# Patient Record
Sex: Female | Born: 1985 | Race: White | Hispanic: No | Marital: Married | Smoking: Former smoker
Health system: Southern US, Community
[De-identification: ages and names within clinical notes are randomized; demographics above are authoritative.]

## PROBLEM LIST (undated history)

## (undated) DIAGNOSIS — R1110 Vomiting, unspecified: Secondary | ICD-10-CM

## (undated) DIAGNOSIS — N920 Excessive and frequent menstruation with regular cycle: Secondary | ICD-10-CM

## (undated) DIAGNOSIS — F99 Mental disorder, not otherwise specified: Secondary | ICD-10-CM

## (undated) DIAGNOSIS — D649 Anemia, unspecified: Secondary | ICD-10-CM

## (undated) DIAGNOSIS — U071 COVID-19: Secondary | ICD-10-CM

## (undated) DIAGNOSIS — B9689 Other specified bacterial agents as the cause of diseases classified elsewhere: Secondary | ICD-10-CM

## (undated) DIAGNOSIS — N923 Ovulation bleeding: Secondary | ICD-10-CM

## (undated) DIAGNOSIS — T4145XA Adverse effect of unspecified anesthetic, initial encounter: Secondary | ICD-10-CM

## (undated) DIAGNOSIS — K219 Gastro-esophageal reflux disease without esophagitis: Secondary | ICD-10-CM

## (undated) DIAGNOSIS — N3010 Interstitial cystitis (chronic) without hematuria: Secondary | ICD-10-CM

## (undated) DIAGNOSIS — R102 Pelvic and perineal pain: Secondary | ICD-10-CM

## (undated) DIAGNOSIS — R51 Headache: Secondary | ICD-10-CM

## (undated) DIAGNOSIS — N189 Chronic kidney disease, unspecified: Secondary | ICD-10-CM

## (undated) DIAGNOSIS — R011 Cardiac murmur, unspecified: Secondary | ICD-10-CM

## (undated) DIAGNOSIS — R519 Headache, unspecified: Secondary | ICD-10-CM

## (undated) DIAGNOSIS — E039 Hypothyroidism, unspecified: Secondary | ICD-10-CM

## (undated) DIAGNOSIS — R350 Frequency of micturition: Secondary | ICD-10-CM

## (undated) DIAGNOSIS — R351 Nocturia: Secondary | ICD-10-CM

## (undated) DIAGNOSIS — R87612 Low grade squamous intraepithelial lesion on cytologic smear of cervix (LGSIL): Secondary | ICD-10-CM

## (undated) DIAGNOSIS — K601 Chronic anal fissure: Secondary | ICD-10-CM

## (undated) DIAGNOSIS — T8859XA Other complications of anesthesia, initial encounter: Secondary | ICD-10-CM

## (undated) DIAGNOSIS — Z8759 Personal history of other complications of pregnancy, childbirth and the puerperium: Secondary | ICD-10-CM

## (undated) DIAGNOSIS — N760 Acute vaginitis: Secondary | ICD-10-CM

## (undated) DIAGNOSIS — R112 Nausea with vomiting, unspecified: Secondary | ICD-10-CM

## (undated) DIAGNOSIS — J45909 Unspecified asthma, uncomplicated: Secondary | ICD-10-CM

## (undated) DIAGNOSIS — F419 Anxiety disorder, unspecified: Secondary | ICD-10-CM

## (undated) DIAGNOSIS — R3915 Urgency of urination: Secondary | ICD-10-CM

## (undated) DIAGNOSIS — Z9889 Other specified postprocedural states: Secondary | ICD-10-CM

## (undated) HISTORY — PX: WISDOM TOOTH EXTRACTION: SHX21

## (undated) HISTORY — DX: Chronic anal fissure: K60.1

## (undated) HISTORY — PX: COLPOSCOPY: SHX161

## (undated) HISTORY — DX: COVID-19: U07.1

## (undated) HISTORY — PX: LAPAROSCOPIC LYSIS OF ADHESIONS: SHX5905

---
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
## 2007-04-18 ENCOUNTER — Encounter: Admission: RE | Admit: 2007-04-18 | Discharge: 2007-04-18 | Payer: Self-pay | Admitting: Internal Medicine

## 2007-05-04 ENCOUNTER — Emergency Department (HOSPITAL_COMMUNITY): Admission: EM | Admit: 2007-05-04 | Discharge: 2007-05-04 | Payer: Self-pay | Admitting: Emergency Medicine

## 2008-09-13 DIAGNOSIS — R87612 Low grade squamous intraepithelial lesion on cytologic smear of cervix (LGSIL): Secondary | ICD-10-CM

## 2008-09-13 HISTORY — PX: COLPOSCOPY: SHX161

## 2008-09-13 HISTORY — DX: Low grade squamous intraepithelial lesion on cytologic smear of cervix (LGSIL): R87.612

## 2011-08-30 ENCOUNTER — Encounter (HOSPITAL_COMMUNITY): Payer: Self-pay

## 2011-09-02 ENCOUNTER — Other Ambulatory Visit: Payer: Self-pay | Admitting: Obstetrics and Gynecology

## 2011-09-13 ENCOUNTER — Encounter (HOSPITAL_COMMUNITY): Payer: Self-pay

## 2011-09-13 ENCOUNTER — Encounter (HOSPITAL_COMMUNITY)
Admission: RE | Admit: 2011-09-13 | Discharge: 2011-09-13 | Disposition: A | Discharge status: Home / Self Care | Payer: 59 | Source: Ambulatory Visit | Attending: Obstetrics and Gynecology | Admitting: Obstetrics and Gynecology

## 2011-09-13 HISTORY — DX: Anxiety disorder, unspecified: F41.9

## 2011-09-13 LAB — BASIC METABOLIC PANEL
BUN: 14 mg/dL (ref 6–23)
CO2: 23 mEq/L (ref 19–32)
Calcium: 9.3 mg/dL (ref 8.4–10.5)
Chloride: 103 mEq/L (ref 96–112)
Creatinine, Ser: 0.77 mg/dL (ref 0.50–1.10)
GFR calc Af Amer: >90 mL/min (ref >90)
GFR calc non Af Amer: >90 mL/min (ref >90)
Glucose, Bld: 103 mg/dL — ABNORMAL HIGH (ref 70–99)
Potassium: 4.3 mEq/L (ref 3.5–5.1)
Sodium: 135 mEq/L (ref 135–145)

## 2011-09-13 LAB — CBC
HCT: 39.9 % (ref 36.0–46.0)
Hemoglobin: 13.8 g/dL (ref 12.0–15.0)
MCH: 31.3 pg (ref 26.0–34.0)
MCHC: 34.6 g/dL (ref 30.0–36.0)
MCV: 90.5 fL (ref 78.0–100.0)
Platelets: 286 10*3/uL (ref 150–400)
RBC: 4.41 MIL/uL (ref 3.87–5.11)
RDW: 12.8 % (ref 11.5–15.5)
WBC: 9.5 10*3/uL (ref 4.0–10.5)

## 2011-09-13 LAB — SURGICAL PCR SCREEN
MRSA, PCR: NEGATIVE
Staphylococcus aureus: POSITIVE — AB

## 2011-09-13 NOTE — Patient Instructions (Addendum)
22 [name]  09/13/2011   Your procedure is scheduled on:  09/16/11 7:30  Enter through the Main Entrance of [facility] at 600 AM.  Pick up the phone at the desk and dial [phone].   Call this number if you have problems the morning of surgery: [phone]   Remember:   Do not eat food:After Midnight.  Do not drink clear liquids: After Midnight.  Take these medicines the morning of surgery with A SIP OF WATER: NA   Do not wear jewelry, make-up or nail polish.  Do not wear lotions, powders, or perfumes. You may wear deodorant.  Do not shave 48 hours prior to surgery.  Do not bring valuables to the hospital.  Contacts, dentures or bridgework may not be worn into surgery.  Leave suitcase in the car. After surgery it may be brought to your room.  For patients admitted to the hospital, checkout time is 11:00 AM the day of discharge.   Patients discharged the day of surgery will not be allowed to drive home.  Name and phone number of your driver: Mother   See Hx  Special Instructions: CHG Shower Use Special Wash: 1/2 bottle night before surgery and 1/2 bottle morning of surgery.   Please read over the following fact sheets that you were given: MRSA Information

## 2011-09-15 NOTE — H&P (Signed)
Subjective: Patient is a 26 y.o. gravida 0 para 0, female.  I was consulted for evaluation of chronic pelvic pain. Onset of symptoms was gradual starting several month ago with gradually worsening course since that time. The pain occurs without warning and may not be related to any menstrual bleeding. . It is located in the pelvic and lasts 2 days. She describes the pain as stabbing. Symptoms at times require percocet to resolve the pain.. In the past, she has undergone medical management, including OCP's continuous.  She has no history of PID, STD's. She does desire further childbearing. She did have a mirena IUD with was removed because os bleeding and pelvic pain issue and she now has an Implanon device. Even though she is not have menses she is still have episodes of severe pain and now presents for laparoscopy to identify the etiology.  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  Past Medical History  Diagnosis Date  . Anxiety     Past Surgical History  Procedure Date  . Wisdom tooth extraction 2008    No prescriptions prior to admission   Allergies  Allergen Reactions  . Penicillins Hives  . Tramadol Itching  . Vicodin (Hydrocodone-Acetaminophen) Itching    History  Substance Use Topics  . Smoking status: Current Everyday Smoker  . Smokeless tobacco: Not on file  . Alcohol Use: Yes     Rarely    No family history on file.  Review of Systems GI: no nausea, vomiting or diarrhea GU: No dysuria, frequency or urgency Gyn: Denies discharge or itch. Does have episodes of severe  pain will are now being assessed   Objective: Vital signs in last 24 hours:    There were no vitals taken for this visit.  General Appearance:    Alert, cooperative, no  distress, appears stated age  Head:    Normocephalic, without obvious abnormality, atraumatic  Eyes:    PERRL, conjunctiva/corneas clear, EOM's intact, fundi    benign, both eyes  Ears:    Normal TM's and external ear canals, both ears  Nose:   Nares normal, septum midline, mucosa normal, no drainage    or sinus tenderness  Throat:   Lips, mucosa, and tongue normal; teeth and gums normal  Neck:   Supple, symmetrical, trachea midline, no adenopathy;    thyroid:  no enlargement/tenderness/nodules; no carotid   bruit or JVD  Back:     Symmetric, no curvature, ROM normal, no CVA tenderness  Lungs:     Clear to auscultation bilaterally, respirations unlabored  Chest Wall:    No tenderness or deformity   Heart:    Regular rate and rhythm, S1 and S2 normal, no murmur, rub   or gallop     Abdomen:     Soft, non-tender, bowel sounds active all four quadrants,    no masses, no organomegaly  Genitalia:    Normal female without lesion, discharge or tenderness   BUS: are within normal limits    Vagina is without lesion or significant discharge   Cervix: Is without lesion and is tender to touch and motion    Uterus is normal size and is tender to touch and motion    Adnexa reveal no masses  Assessment/Plan: Chronic Pelvic Pain  Plan is to perform laparoscopy and endometrial biopsy to see if an etiology for the pain can be established and corrected.  The risks and benefits of this procedure were discussed with the patient and informed consent has been obtained.

## 2011-09-16 ENCOUNTER — Encounter (HOSPITAL_COMMUNITY): Payer: Self-pay | Admitting: Anesthesiology

## 2011-09-16 ENCOUNTER — Ambulatory Visit (HOSPITAL_COMMUNITY): Payer: 59 | Admitting: Anesthesiology

## 2011-09-16 ENCOUNTER — Ambulatory Visit (HOSPITAL_COMMUNITY)
Admission: RE | Admit: 2011-09-16 | Discharge: 2011-09-16 | Disposition: A | Discharge status: Home / Self Care | Payer: 59 | Source: Ambulatory Visit | Attending: Obstetrics and Gynecology | Admitting: Obstetrics and Gynecology

## 2011-09-16 ENCOUNTER — Encounter (HOSPITAL_COMMUNITY): Payer: Self-pay | Admitting: *Deleted

## 2011-09-16 ENCOUNTER — Encounter (HOSPITAL_COMMUNITY)
Admission: RE | Disposition: A | Discharge status: Home / Self Care | Payer: Self-pay | Source: Ambulatory Visit | Attending: Obstetrics and Gynecology

## 2011-09-16 ENCOUNTER — Other Ambulatory Visit: Payer: Self-pay | Admitting: Obstetrics and Gynecology

## 2011-09-16 DIAGNOSIS — G8929 Other chronic pain: Secondary | ICD-10-CM

## 2011-09-16 DIAGNOSIS — Z01812 Encounter for preprocedural laboratory examination: Secondary | ICD-10-CM | POA: Insufficient documentation

## 2011-09-16 DIAGNOSIS — N949 Unspecified condition associated with female genital organs and menstrual cycle: Secondary | ICD-10-CM | POA: Insufficient documentation

## 2011-09-16 DIAGNOSIS — N938 Other specified abnormal uterine and vaginal bleeding: Secondary | ICD-10-CM | POA: Insufficient documentation

## 2011-09-16 DIAGNOSIS — N35919 Unspecified urethral stricture, male, unspecified site: Secondary | ICD-10-CM | POA: Insufficient documentation

## 2011-09-16 DIAGNOSIS — Z01818 Encounter for other preprocedural examination: Secondary | ICD-10-CM | POA: Insufficient documentation

## 2011-09-16 HISTORY — PX: DILATION AND CURETTAGE OF UTERUS: SHX78

## 2011-09-16 HISTORY — PX: LAPAROSCOPY: SHX197

## 2011-09-16 LAB — PREGNANCY, URINE: Preg Test, Ur: NEGATIVE

## 2011-09-16 SURGERY — LAPAROSCOPY OPERATIVE
Anesthesia: General | Site: Vagina | Wound class: Clean Contaminated

## 2011-09-16 MED ORDER — KETOROLAC TROMETHAMINE 30 MG/ML IJ SOLN
INTRAMUSCULAR | Status: DC | PRN
  Administered 2011-09-16: 30 mg via INTRAVENOUS

## 2011-09-16 MED ORDER — ROCURONIUM BROMIDE 100 MG/10ML IV SOLN
INTRAVENOUS | Status: DC | PRN
  Administered 2011-09-16: 30 mg via INTRAVENOUS

## 2011-09-16 MED ORDER — ONDANSETRON HCL 4 MG/2ML IJ SOLN
INTRAMUSCULAR | Status: DC | PRN
  Administered 2011-09-16: 4 mg via INTRAVENOUS

## 2011-09-16 MED ORDER — LIDOCAINE HCL (CARDIAC) 20 MG/ML IV SOLN
INTRAVENOUS | Status: AC
  Filled 2011-09-16: qty 5

## 2011-09-16 MED ORDER — LIDOCAINE 5 % EX OINT
TOPICAL_OINTMENT | CUTANEOUS | Status: DC | PRN
  Administered 2011-09-16: 1 via TOPICAL
  Filled 2011-09-16: qty 35.44

## 2011-09-16 MED ORDER — DEXAMETHASONE SODIUM PHOSPHATE 4 MG/ML IJ SOLN
INTRAMUSCULAR | Status: DC | PRN
  Administered 2011-09-16: 6 mg via INTRAVENOUS

## 2011-09-16 MED ORDER — PROPOFOL 10 MG/ML IV EMUL
INTRAVENOUS | Status: DC | PRN
  Administered 2011-09-16: 160 mg via INTRAVENOUS

## 2011-09-16 MED ORDER — FENTANYL CITRATE 0.05 MG/ML IJ SOLN
INTRAMUSCULAR | Status: AC
  Filled 2011-09-16: qty 5

## 2011-09-16 MED ORDER — NEOSTIGMINE METHYLSULFATE 1 MG/ML IJ SOLN
INTRAMUSCULAR | Status: DC | PRN
  Administered 2011-09-16: 2 mg via INTRAVENOUS

## 2011-09-16 MED ORDER — HYDROMORPHONE HCL PF 1 MG/ML IJ SOLN
0.2500 mg | INTRAMUSCULAR | Status: DC | PRN
  Administered 2011-09-16 (×2): 0.5 mg via INTRAVENOUS

## 2011-09-16 MED ORDER — GLYCOPYRROLATE 0.2 MG/ML IJ SOLN
INTRAMUSCULAR | Status: DC | PRN
  Administered 2011-09-16: .8 mg via INTRAVENOUS

## 2011-09-16 MED ORDER — MIDAZOLAM HCL 5 MG/5ML IJ SOLN
INTRAMUSCULAR | Status: DC | PRN
  Administered 2011-09-16: 2 mg via INTRAVENOUS

## 2011-09-16 MED ORDER — PHENAZOPYRIDINE HCL 100 MG PO TABS
100.0000 mg | ORAL_TABLET | Freq: Once | ORAL | Status: AC
  Administered 2011-09-16: 100 mg via ORAL
  Filled 2011-09-16: qty 1

## 2011-09-16 MED ORDER — ONDANSETRON HCL 4 MG/2ML IJ SOLN
INTRAMUSCULAR | Status: AC
  Filled 2011-09-16: qty 2

## 2011-09-16 MED ORDER — NEOSTIGMINE METHYLSULFATE 1 MG/ML IJ SOLN
INTRAMUSCULAR | Status: AC
  Filled 2011-09-16: qty 10

## 2011-09-16 MED ORDER — KETOROLAC TROMETHAMINE 30 MG/ML IJ SOLN
INTRAMUSCULAR | Status: AC
  Filled 2011-09-16: qty 1

## 2011-09-16 MED ORDER — PROPOFOL 10 MG/ML IV EMUL
INTRAVENOUS | Status: AC
  Filled 2011-09-16: qty 20

## 2011-09-16 MED ORDER — LACTATED RINGERS IV SOLN
INTRAVENOUS | Status: DC
  Administered 2011-09-16: 07:00:00 via INTRAVENOUS
  Administered 2011-09-16: 125 mL/h via INTRAVENOUS
  Administered 2011-09-16 (×2): via INTRAVENOUS

## 2011-09-16 MED ORDER — LIDOCAINE HCL (CARDIAC) 20 MG/ML IV SOLN
INTRAVENOUS | Status: DC | PRN
  Administered 2011-09-16: 60 mg via INTRAVENOUS

## 2011-09-16 MED ORDER — MIDAZOLAM HCL 2 MG/2ML IJ SOLN
INTRAMUSCULAR | Status: AC
  Filled 2011-09-16: qty 2

## 2011-09-16 MED ORDER — DEXAMETHASONE SODIUM PHOSPHATE 10 MG/ML IJ SOLN
INTRAMUSCULAR | Status: AC
  Filled 2011-09-16: qty 1

## 2011-09-16 MED ORDER — BUPIVACAINE HCL (PF) 0.25 % IJ SOLN
INTRAMUSCULAR | Status: DC | PRN
  Administered 2011-09-16: 10 mL

## 2011-09-16 MED ORDER — HYDROMORPHONE HCL PF 1 MG/ML IJ SOLN
INTRAMUSCULAR | Status: AC
  Administered 2011-09-16: 0.5 mg via INTRAVENOUS
  Filled 2011-09-16: qty 1

## 2011-09-16 MED ORDER — GLYCOPYRROLATE 0.2 MG/ML IJ SOLN
INTRAMUSCULAR | Status: AC
  Filled 2011-09-16: qty 1

## 2011-09-16 MED ORDER — FENTANYL CITRATE 0.05 MG/ML IJ SOLN
INTRAMUSCULAR | Status: DC | PRN
  Administered 2011-09-16: 50 ug via INTRAVENOUS
  Administered 2011-09-16: 100 ug via INTRAVENOUS

## 2011-09-16 MED ORDER — ROCURONIUM BROMIDE 50 MG/5ML IV SOLN
INTRAVENOUS | Status: AC
  Filled 2011-09-16: qty 1

## 2011-09-16 SURGICAL SUPPLY — 31 items
ABLATOR ENDOMETRIAL BIPOLAR (ABLATOR) IMPLANT
BAG SPEC RTRVL LRG 6X4 10 (ENDOMECHANICALS)
BLADE SURG 15 STRL LF C SS BP (BLADE) ×3 IMPLANT
BLADE SURG 15 STRL SS (BLADE) ×4
CABLE HIGH FREQUENCY MONO STRZ (ELECTRODE) IMPLANT
CANISTER SUCTION 2500CC (MISCELLANEOUS) ×4 IMPLANT
CATH ROBINSON RED A/P 16FR (CATHETERS) ×4 IMPLANT
CLOTH BEACON ORANGE TIMEOUT ST (SAFETY) ×4 IMPLANT
CONTAINER PREFILL 10% NBF 60ML (FORM) ×8 IMPLANT
DRESSING COVERLET 3X1 FLEXIBLE (GAUZE/BANDAGES/DRESSINGS) ×2 IMPLANT
ELECT REM PT RETURN 9FT ADLT (ELECTROSURGICAL)
ELECTRODE REM PT RTRN 9FT ADLT (ELECTROSURGICAL) IMPLANT
EVACUATOR SMOKE 8.L (FILTER) ×4 IMPLANT
FORCEPS CUTTING 33CM 5MM (CUTTING FORCEPS) IMPLANT
GLOVE BIO SURGEON STRL SZ7.5 (GLOVE) ×8 IMPLANT
GOWN PREVENTION PLUS LG XLONG (DISPOSABLE) ×8 IMPLANT
GOWN PREVENTION PLUS XXLARGE (GOWN DISPOSABLE) ×4 IMPLANT
GOWN STRL REIN XL XLG (GOWN DISPOSABLE) ×4 IMPLANT
LOOP ANGLED CUTTING 22FR (CUTTING LOOP) IMPLANT
NS IRRIG 1000ML POUR BTL (IV SOLUTION) ×4 IMPLANT
PACK HYSTEROSCOPY LF (CUSTOM PROCEDURE TRAY) ×4 IMPLANT
PACK LAPAROSCOPY BASIN (CUSTOM PROCEDURE TRAY) ×4 IMPLANT
POUCH SPECIMEN RETRIEVAL 10MM (ENDOMECHANICALS) IMPLANT
RINGERS IRRIG 1000ML POUR BTL (IV SOLUTION) IMPLANT
SET IRRIG TUBING LAPAROSCOPIC (IRRIGATION / IRRIGATOR) IMPLANT
SOLUTION ELECTROLUBE (MISCELLANEOUS) IMPLANT
STRIP CLOSURE SKIN 1/4X3 (GAUZE/BANDAGES/DRESSINGS) ×2 IMPLANT
SUT VICRYL 4-0 PS2 18IN ABS (SUTURE) ×4 IMPLANT
TOWEL OR 17X24 6PK STRL BLUE (TOWEL DISPOSABLE) ×8 IMPLANT
WARMER LAPAROSCOPE (MISCELLANEOUS) ×4 IMPLANT
WATER STERILE IRR 1000ML POUR (IV SOLUTION) ×4 IMPLANT

## 2011-09-16 NOTE — Transfer of Care (Signed)
Immediate Anesthesia Transfer of Care Note  Patient: [name]  Procedure(s) Performed:  LAPAROSCOPY OPERATIVE; DILATATION AND CURETTAGE - Endometrial Biopsy, Urethral Dilation  Patient Location: PACU  Anesthesia Type: General  Level of Consciousness: awake, alert  and oriented  Airway & Oxygen Therapy: Patient Spontanous Breathing and Patient connected to nasal cannula oxygen  Post-op Assessment: Report given to PACU RN and Post -op Vital signs reviewed and stable  Post vital signs: Reviewed and stable  Complications: No apparent anesthesia complications

## 2011-09-16 NOTE — Anesthesia Postprocedure Evaluation (Signed)
Anesthesia Post Note  Patient: [name]  Procedure(s) Performed:  LAPAROSCOPY OPERATIVE; DILATATION AND CURETTAGE - Endometrial Biopsy, Urethral Dilation  Anesthesia type: GA  Patient location: PACU  Post pain: Pain level controlled  Post assessment: Post-op Vital signs reviewed  Last Vitals:  Filed Vitals:   09/16/11 0945  BP:   Pulse: 67  Temp:   Resp: 16    Post vital signs: Reviewed  Level of consciousness: sedated  Complications: No apparent anesthesia complications

## 2011-09-16 NOTE — Progress Notes (Signed)
Status and exam unchanged. Will proceed with laparoscopy and endometrial biopsy.

## 2011-09-16 NOTE — Op Note (Signed)
NAME:  [name], [name]                 ACCOUNT NO.:  [id number]  MEDICAL RECORD NO.:  [id number]  LOCATION:  [facility]                          FACILITY:  [facility]  PHYSICIAN:  Miguel Aschoff, M.D.       DATE OF BIRTH:  July 14, 1986  DATE OF PROCEDURE:  09/16/2011 DATE OF DISCHARGE:                              OPERATIVE REPORT   PREOPERATIVE DIAGNOSES:  Chronic pelvic pain, urethral stenosis, irregular vaginal bleeding.  POSTOPERATIVE DIAGNOSES:  Chronic pelvic pain, urethral stenosis, irregular vaginal bleeding.  PROCEDURE:  Endometrial biopsy, cervical dilatation, urethral dilatation followed by diagnostic laparoscopy.  SURGEON:  Miguel Aschoff, MD  ANESTHESIA:  General.  COMPLICATIONS:  None.  JUSTIFICATION:  The patient is a 26 year old white female with a several month history of severe lower abdominal and pelvic pain that has not responded to conservative medical therapy including antibiotic treatments and efforts to arrest her menses both with Mirena intrauterine device followed by Implanon implant.  Pelvic pain persists. It is not associated with cycles, severe in nature, requiring narcotic medication and because of this she is being taken to the operating room at this time to see if an etiology for this pelvic pain could be established.  In addition, the patient reports urinary hesitancy in starting her stream which is becoming increasingly more difficult.  She is suspected to have urethral stenosis and this will be corrected at this setting.  In addition, endometrial biopsy would be carried out in effort to rule out chronic endometritis.  The risks and benefits of this procedure were discussed with the patient and her mother.  Informed consent has been obtained.  PROCEDURE IN DETAIL:  The patient was taken to the operating room, placed in the supine position.  General anesthesia was administered without difficulty.  She was then prepped and draped in the usual sterile fashion.  Once  this was done, she was placed in dorsal lithotomy position.  The bladder was catheterized.  Then using 0 urethral dilators, the urethra was dilated to a normal caliber without difficulty.  After this was done, a speculum placed in the vaginal vault.  Anterior cervical lip was grasped with tenaculum and then using 0-Pratt dilators, the cervical canal was dilated until a #25 Pratt dilator could be passed.  Then, using a small curette, 3 swipes of endometrial biopsy were carried out and sent for histologic study to rule out chronic endometritis or any other source of her persistent pain.  Once this was completed, Hulka tenaculum was placed through the cervix and then attention was directed to the abdomen.  A small infraumbilical incision was made.  A Veress needle was inserted and then the abdomen was insufflated with 3 L of CO2.  Following the insufflation, the trocar to laparoscope was placed followed by laparoscope itself.  Then under direct visualization, a 5 mm port was established in the right lower quadrant without difficulty.  Systematic inspection revealed the anterior bladder peritoneum to be unremarkable. The uterus appeared to be normal size and shape.  The surface was smooth and regular.  There was no evidence of any inflammation involving the surface of the uterus.  The round ligaments were inspected on the  right side.  The round ligament was completely normal.  On the left side, there was a defect at the exit point of the round ligament consistent with small hernia.  The cul-de-sac was then inspected looking for implants consistent with endometriosis but no implants were found.  This cul-de-sac appeared grossly within normal limits.  The tubes were then inspected.  They were also noted to be within normal limits with fine delicate fimbria and no adhesions noted.  The ovaries were inspected and were also noted to be completely within normal limits secondary to the fact that the  patient had the Implanon implant.  There were no adhesions around the ovaries and tubes.  The anatomy was completely normal.  The appendix was then visualized, again was noninflamed, appeared to be completely normal, and then the upper abdomen was inspected.  The liver surface was unremarkable.  There were no abnormalities noted at all in the abdomen.  At this point, the Kleppinger forceps were placed through the accessory port.  The uterosacral ligaments were identified at the junction into the uterus and they were cauterized in effort to see if we could decrease the patient's pelvic pain secondary to uterine origin. After this was done, attention was carried to avoid any cauterization in the ureters.  The procedure was completed.  The CO2 was allowed to escape.  All instruments were removed.  The small incisions were closed using subcuticular 3-0 Vicryl.  The port sites were then injected with 0.25% Marcaine.  A total of 10 mL were used.  The patient was then reversed in the anesthetic and taken to the recovery room in satisfactory condition.  Estimated blood loss was minimal.  The plan is for the patient to be discharged home.  Medications for home include Vicodin 1 every 3-4 hours as needed for pain.  She is instructed to place nothing in the vagina for 2 weeks.  To call for any problems such as fever, pain, or heavy bleeding.  She will be reassessed in 2-3 weeks.  If her pelvic pain and abdominal pain persists, consultation would be made with Gastroenterology and Neurology to see if an etiology could be established.     Miguel Aschoff, M.D.     AR/MEDQ  D:  09/16/2011  T:  09/16/2011  Job:  [id number]

## 2011-09-16 NOTE — Anesthesia Preprocedure Evaluation (Addendum)
Anesthesia Evaluation  Patient identified by MRN, date of birth, ID band Patient awake    Reviewed: Allergy & Precautions, H&P , Patient's Chart, lab work & pertinent test results, reviewed documented beta blocker date and time   Airway Mallampati: II TM Distance: >3 FB Neck ROM: full    Dental No notable dental hx.    Pulmonary  clear to auscultation  Pulmonary exam normal       Cardiovascular regular Normal    Neuro/Psych    GI/Hepatic   Endo/Other    Renal/GU      Musculoskeletal   Abdominal   Peds  Hematology   Anesthesia Other Findings   Reproductive/Obstetrics                           Anesthesia Physical Anesthesia Plan  ASA: II  Anesthesia Plan: General   Post-op Pain Management:    Induction: Intravenous  Airway Management Planned: Oral ETT  Additional Equipment:   Intra-op Plan:   Post-operative Plan:   Informed Consent: I have reviewed the patients History and Physical, chart, labs and discussed the procedure including the risks, benefits and alternatives for the proposed anesthesia with the patient or authorized representative who has indicated his/her understanding and acceptance.   Dental Advisory Given and Dental advisory given  Plan Discussed with: CRNA and Surgeon  Anesthesia Plan Comments: (  Discussed  general anesthesia, including possible nausea, instrumentation of airway, sore throat,pulmonary aspiration, etc. I asked if the were any outstanding questions, or  concerns before we proceeded. )        Anesthesia Quick Evaluation  

## 2011-09-16 NOTE — Brief Op Note (Signed)
09/16/2011  8:06 AM  PATIENT:  [name]  26 y.o. female  PRE-OPERATIVE DIAGNOSIS:  pelvic pain  POST-OPERATIVE DIAGNOSIS:  pelvic pain; urethral stenosis  PROCEDURE:  Procedure(s): LAPAROSCOPY OPERATIVE DILATATION AND CURETTAGE  SURGEON:  Surgeon(s): Miguel Aschoff  ANESTHESIA:   general  EBL:  Total I/O In: 1030 [I.V.:1030] Out: -   BLOOD ADMINISTERED:none  DRAINS: none   LOCAL MEDICATIONS USED:  MARCAINE 10CC  SPECIMEN:  Source of Specimen:  endometrial currettings  DISPOSITION OF SPECIMEN:  PATHOLOGY  COUNTS:  YES  DICTATION: .Other Dictation: Dictation Number [id number]  PLAN OF CARE: Discharge to home after PACU  PATIENT DISPOSITION:  PACU - hemodynamically stable.   Delay start of Pharmacological VTE agent (>24hrs) due to surgical blood loss or risk of bleeding:  {YES/NO/NOT APPLICABLE:20182

## 2011-09-16 NOTE — Anesthesia Procedure Notes (Signed)
Procedure Name: Intubation Date/Time: 09/16/2011 7:29 AM Performed by: Carlyle Lipa Pre-anesthesia Checklist: Patient identified, Timeout performed, Emergency Drugs available, Suction available and Patient being monitored Patient Re-evaluated:Patient Re-evaluated prior to inductionOxygen Delivery Method: Circle System Utilized Preoxygenation: Pre-oxygenation with 100% oxygen Intubation Type: IV induction Ventilation: Mask ventilation without difficulty Laryngoscope Size: Miller and 2 Grade View: Grade I Tube type: Oral Tube size: 7.0 mm Number of attempts: 1 Airway Equipment and Method: stylet Placement Confirmation: ETT inserted through vocal cords under direct vision Secured at: 20 cm Tube secured with: Tape Dental Injury: Teeth and Oropharynx as per pre-operative assessment

## 2011-09-20 ENCOUNTER — Encounter (HOSPITAL_COMMUNITY): Payer: Self-pay | Admitting: Obstetrics and Gynecology

## 2011-09-20 ENCOUNTER — Other Ambulatory Visit: Payer: Self-pay | Admitting: Urology

## 2011-10-08 ENCOUNTER — Encounter (HOSPITAL_BASED_OUTPATIENT_CLINIC_OR_DEPARTMENT_OTHER): Payer: Self-pay | Admitting: *Deleted

## 2011-10-08 NOTE — Progress Notes (Signed)
NPO AFTER MN. ARRIVES AT 0615. NEEDS HH AND URINE PREG.

## 2011-10-11 NOTE — H&P (Signed)
1. Bladder Pain 788.99 2. Feelings Of Urinary Urgency 788.63 3. Nocturia 788.43 4. Urinary Frequency 788.41  History of Present Illness  [name] is a 26 yo WF sent in consultation by Dr. Billy Coast for pelvic pain and urinary frequency and urgency.   Her symptoms have been going on for the past year and she is going to have laparoscopy on Thursday.  She had a drug eluting IUD placed in 12/10.  She had sharp pain with the IUD and it was removed.  She had another placed in 6/11 and had similar problem with pain similar to a menstrual cramp.  She has not had incontinence but she has marked suprapubic pain with a full bladder.  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   She has frequency q1hr and nocturia 3-4x.  She has no dysuria or hematuria.  She has some intermittancy and post void dribbling.  She hasn't noticed any dietary aggravators.  She has had no treatment directed at her bladder.   She has frequent UTI's but the last was in June.  She has irregular bowels with alternating constipation and diarrhea.  She denies dysparunia.   Past Medical History Problems  1. History of  Anxiety (Symptom) 300.00 2. History of  Asthma 493.90 3. History of  Esophageal Reflux 530.81 4. History of  Excessive Bleeding During Period (Menorrhagia) 626.2 5. History of  Migraine Headache 346.90  Surgical History Problems  1. History of  No Surgical Problems  Current Meds 1. Advil TABS; Therapy: (Recorded:31Dec2012) to 2. Aleve TABS; Therapy: (Recorded:31Dec2012) to 3. Estradiol 0.5 MG Oral Tablet; Therapy: (Recorded:31Dec2012) to 4. Frova 2.5 MG Oral Tablet; Therapy: (Recorded:31Dec2012) to 5. Implanon 68 MG Subcutaneous Implant; Therapy: (Recorded:31Dec2012) to 6. Xanax 0.25 MG Oral Tablet; Therapy: (Recorded:31Dec2012) to  Allergies Medication  1. Penicillins 2. Vivelle-Dot PTTW 3. Azithromycin TABS 4.  Hydrocodone-Acetaminophen TABS  Family History Problems  1. Maternal history of  Nephrolithiasis  Social History Problems    Alcohol Use   Caffeine Use   Marital History - Single   Occupation:   History of  Tobacco Use 305.1  Review of Systems Genitourinary, constitutional, skin, eye, otolaryngeal, hematologic/lymphatic, cardiovascular, pulmonary, endocrine, musculoskeletal, gastrointestinal, neurological and psychiatric system(s) were reviewed and pertinent findings if present are noted.  Genitourinary: urinary frequency, urinary urgency, nocturia, incontinence, urinary stream starts and stops, pelvic pain and dyspareunia, but no hematuria.  Gastrointestinal: abdominal pain, heartburn and diarrhea.  Constitutional: night sweats and feeling tired (fatigue).  Integumentary: pruritus.  Hematologic/Lymphatic: a tendency to easily bruise.  Endocrine: polydipsia.  Neurological: headache.  Psychiatric: anxiety.    Vitals Vital Signs [Data Includes: Last 1 Day]  31Dec2012 02:54PM  BMI Calculated: 22.36 BSA Calculated: 1.64 Height: 5 ft 4 in Weight: 131 lb  Blood Pressure: 133 / 76 Temperature: 98.4 F Heart Rate: 92  Physical Exam Constitutional: Well nourished and well developed . No acute distress.  ENT:. The ears and nose are normal in appearance.  Neck: The appearance of the neck is normal and no neck mass is present.  Pulmonary: No respiratory distress and normal respiratory rhythm and effort.  Cardiovascular: Heart rate and rhythm are normal . No peripheral edema.  Abdomen: The abdomen is soft and nontender. No masses are palpated. No CVA tenderness. No hernias are palpable. No hepatosplenomegaly noted. Genitourinary: Examination of the external genitalia shows normal female external genitalia and no lesions. The  urethra is normal in appearance and not tender. There is no urethral mass. Urethral hypermobility is not present. Vaginal exam demonstrates no abnormalities and  no uterine prolapse. No cystocele is identified. No rectocele is identified. The cervix is is without abnormalities.  There is cervical motion tenderness (mild). The uterus is without abnormalities. The adnexa are palpably normal. The bladder is tender, but normal on palpation and not distended. The anus is normal on inspection. The perineum is normal on inspection.  Lymphatics: The femoral and inguinal nodes are not enlarged or tender.  Skin: Normal skin turgor, no visible rash and no visible skin lesions.  Neuro/Psych:. Mood and affect are appropriate.    Results/Data Urine [Data Includes: Last 1 Day]  31Dec2012  COLOR: YELLOW  Reference Range YELLOW APPEARANCE: CLEAR  Reference Range CLEAR SPECIFIC GRAVITY: 1.025  Reference Range 1.005-1.030 pH: 6.0  Reference Range 5.0-8.0 GLUCOSE: NEG mg/dL Reference Range NEG BILIRUBIN: NEG  Reference Range NEG KETONE: NEG mg/dL Reference Range NEG BLOOD: NEG  Reference Range NEG PROTEIN: NEG mg/dL Reference Range NEG UROBILINOGEN: 1 mg/dL Reference Range 0.8-6.5 NITRITE: NEG  Reference Range NEG LEUKOCYTE ESTERASE: NEG  Reference Range NEG  Old records or history reviewed: Records from Dr. Billy Coast reviewed.    Assessment Assessed  1. Bladder Pain 788.99 2. Feelings Of Urinary Urgency 788.63 3. Nocturia 788.43 4. Urinary Frequency 788.41   She has a painful bladder with urgency and frequency.   Her bladder was tender on exam and she may have interstitial cystitis.   Plan Bladder Pain (788.99)  1. Uribel 118 MG Oral Capsule; take 1 po qid prn burning; Therapy: 31Dec2012 to (Last  Rx:31Dec2012) 2. Pelvic Exam  Requested for: 31Dec2012 3. Follow-up Schedule Surgery Office  Follow-up  Requested for: 31Dec2012   I am going to give her Uribel samples and a script for symptom relief. I have given her an IC diet sheet. I am going to set her up for a cystoscopy with HOD and instillation of pyridium and marcaine after she recovers from her  laparoscopy.  I reviewed the risks including but not limited to bleeding, infection, bladder injury, retention, thrombotic events and anesthetic complications.

## 2011-10-13 NOTE — Anesthesia Preprocedure Evaluation (Addendum)
Anesthesia Evaluation  Patient identified by MRN, date of birth, ID band Patient awake    Reviewed: Allergy & Precautions, H&P , NPO status , Patient's Chart, lab work & pertinent test results  Airway Mallampati: II TM Distance: >3 FB Neck ROM: full    Dental No notable dental hx. (+) Teeth Intact and Dental Advisory Given   Pulmonary neg pulmonary ROS,  clear to auscultation  Pulmonary exam normal       Cardiovascular Exercise Tolerance: Good neg cardio ROS regular Normal    Neuro/Psych Negative Neurological ROS  Negative Psych ROS   GI/Hepatic negative GI ROS, Neg liver ROS,   Endo/Other  Negative Endocrine ROS  Renal/GU negative Renal ROS  Genitourinary negative   Musculoskeletal   Abdominal   Peds  Hematology negative hematology ROS (+)   Anesthesia Other Findings   Reproductive/Obstetrics negative OB ROS                          Anesthesia Physical Anesthesia Plan  ASA: I  Anesthesia Plan: General   Post-op Pain Management:    Induction: Intravenous  Airway Management Planned: LMA  Additional Equipment:   Intra-op Plan:   Post-operative Plan:   Informed Consent: I have reviewed the patients History and Physical, chart, labs and discussed the procedure including the risks, benefits and alternatives for the proposed anesthesia with the patient or authorized representative who has indicated his/her understanding and acceptance.   Dental Advisory Given  Plan Discussed with: CRNA and Surgeon  Anesthesia Plan Comments:         Anesthesia Quick Evaluation  

## 2011-10-14 ENCOUNTER — Ambulatory Visit (HOSPITAL_BASED_OUTPATIENT_CLINIC_OR_DEPARTMENT_OTHER): Payer: 59 | Admitting: Anesthesiology

## 2011-10-14 ENCOUNTER — Encounter (HOSPITAL_BASED_OUTPATIENT_CLINIC_OR_DEPARTMENT_OTHER): Payer: Self-pay | Admitting: *Deleted

## 2011-10-14 ENCOUNTER — Encounter (HOSPITAL_BASED_OUTPATIENT_CLINIC_OR_DEPARTMENT_OTHER)
Admission: RE | Disposition: A | Discharge status: Home / Self Care | Payer: Self-pay | Source: Ambulatory Visit | Attending: Urology

## 2011-10-14 ENCOUNTER — Encounter (HOSPITAL_BASED_OUTPATIENT_CLINIC_OR_DEPARTMENT_OTHER): Payer: Self-pay | Admitting: Anesthesiology

## 2011-10-14 ENCOUNTER — Ambulatory Visit (HOSPITAL_BASED_OUTPATIENT_CLINIC_OR_DEPARTMENT_OTHER)
Admission: RE | Admit: 2011-10-14 | Discharge: 2011-10-14 | Disposition: A | Discharge status: Home / Self Care | Payer: 59 | Source: Ambulatory Visit | Attending: Urology | Admitting: Urology

## 2011-10-14 DIAGNOSIS — R350 Frequency of micturition: Secondary | ICD-10-CM | POA: Insufficient documentation

## 2011-10-14 DIAGNOSIS — K219 Gastro-esophageal reflux disease without esophagitis: Secondary | ICD-10-CM | POA: Insufficient documentation

## 2011-10-14 DIAGNOSIS — Z79899 Other long term (current) drug therapy: Secondary | ICD-10-CM | POA: Insufficient documentation

## 2011-10-14 DIAGNOSIS — R3989 Other symptoms and signs involving the genitourinary system: Secondary | ICD-10-CM | POA: Insufficient documentation

## 2011-10-14 DIAGNOSIS — R3915 Urgency of urination: Secondary | ICD-10-CM | POA: Insufficient documentation

## 2011-10-14 DIAGNOSIS — N3010 Interstitial cystitis (chronic) without hematuria: Secondary | ICD-10-CM | POA: Insufficient documentation

## 2011-10-14 DIAGNOSIS — R351 Nocturia: Secondary | ICD-10-CM | POA: Insufficient documentation

## 2011-10-14 DIAGNOSIS — J45909 Unspecified asthma, uncomplicated: Secondary | ICD-10-CM | POA: Insufficient documentation

## 2011-10-14 HISTORY — DX: Frequency of micturition: R35.0

## 2011-10-14 HISTORY — DX: Ovulation bleeding: N92.3

## 2011-10-14 HISTORY — DX: Pelvic and perineal pain: R10.2

## 2011-10-14 HISTORY — DX: Excessive and frequent menstruation with regular cycle: N92.0

## 2011-10-14 HISTORY — DX: Nocturia: R35.1

## 2011-10-14 HISTORY — PX: CYSTO WITH HYDRODISTENSION: SHX5453

## 2011-10-14 HISTORY — DX: Urgency of urination: R39.15

## 2011-10-14 LAB — POCT HEMOGLOBIN-HEMACUE: Hemoglobin: 12.2 g/dL (ref 12.0–15.0)

## 2011-10-14 SURGERY — CYSTOSCOPY, WITH BLADDER HYDRODISTENSION
Anesthesia: General | Site: Bladder | Wound class: Clean Contaminated

## 2011-10-14 MED ORDER — FENTANYL CITRATE 0.05 MG/ML IJ SOLN
INTRAMUSCULAR | Status: DC | PRN
  Administered 2011-10-14 (×2): 50 ug via INTRAVENOUS

## 2011-10-14 MED ORDER — STERILE WATER FOR IRRIGATION IR SOLN
Status: DC | PRN
  Administered 2011-10-14: 3000 mL via INTRAVESICAL

## 2011-10-14 MED ORDER — PROMETHAZINE HCL 25 MG/ML IJ SOLN: 6.2500 mg | INTRAMUSCULAR | Status: DC | PRN

## 2011-10-14 MED ORDER — FENTANYL CITRATE 0.05 MG/ML IJ SOLN
25.0000 ug | INTRAMUSCULAR | Status: DC | PRN
  Administered 2011-10-14: 50 ug via INTRAVENOUS
  Administered 2011-10-14 (×2): 25 ug via INTRAVENOUS

## 2011-10-14 MED ORDER — PROPOFOL 10 MG/ML IV EMUL
INTRAVENOUS | Status: DC | PRN
  Administered 2011-10-14: 200 mg via INTRAVENOUS

## 2011-10-14 MED ORDER — KETOROLAC TROMETHAMINE 30 MG/ML IJ SOLN
INTRAMUSCULAR | Status: DC | PRN
  Administered 2011-10-14: 30 mg via INTRAVENOUS

## 2011-10-14 MED ORDER — CIPROFLOXACIN IN D5W 400 MG/200ML IV SOLN
400.0000 mg | INTRAVENOUS | Status: AC
  Administered 2011-10-14: 400 mg via INTRAVENOUS

## 2011-10-14 MED ORDER — PHENAZOPYRIDINE HCL 200 MG PO TABS
200.0000 mg | ORAL_TABLET | Freq: Three times a day (TID) | ORAL | Status: AC
  Administered 2011-10-14: 200 mg via ORAL

## 2011-10-14 MED ORDER — LACTATED RINGERS IV SOLN
INTRAVENOUS | Status: DC | PRN
  Administered 2011-10-14: 07:00:00 via INTRAVENOUS

## 2011-10-14 MED ORDER — LACTATED RINGERS IV SOLN: INTRAVENOUS | Status: DC

## 2011-10-14 MED ORDER — OXYCODONE-ACETAMINOPHEN 5-325 MG PO TABS
1.0000 | ORAL_TABLET | ORAL | Status: AC | PRN
  Administered 2011-10-14: 1 via ORAL

## 2011-10-14 MED ORDER — KETOROLAC TROMETHAMINE 30 MG/ML IJ SOLN
30.0000 mg | Freq: Four times a day (QID) | INTRAMUSCULAR | Status: AC | PRN
  Administered 2011-10-14: 30 mg via INTRAVENOUS

## 2011-10-14 MED ORDER — DEXAMETHASONE SODIUM PHOSPHATE 4 MG/ML IJ SOLN
INTRAMUSCULAR | Status: DC | PRN
  Administered 2011-10-14: 4 mg via INTRAVENOUS

## 2011-10-14 MED ORDER — CIPROFLOXACIN HCL 250 MG PO TABS: 500.0000 mg | ORAL_TABLET | Freq: Two times a day (BID) | ORAL | Status: AC

## 2011-10-14 MED ORDER — OXYCODONE-ACETAMINOPHEN 5-325 MG PO TABS: 1.0000 | ORAL_TABLET | Freq: Four times a day (QID) | ORAL | Status: DC | PRN

## 2011-10-14 MED ORDER — PHENAZOPYRIDINE HCL 200 MG PO TABS: 200.0000 mg | ORAL_TABLET | Freq: Three times a day (TID) | ORAL | Status: AC | PRN

## 2011-10-14 MED ORDER — ONDANSETRON HCL 4 MG/2ML IJ SOLN
INTRAMUSCULAR | Status: DC | PRN
  Administered 2011-10-14: 4 mg via INTRAVENOUS

## 2011-10-14 MED ORDER — LIDOCAINE HCL (CARDIAC) 20 MG/ML IV SOLN
INTRAVENOUS | Status: DC | PRN
  Administered 2011-10-14: 80 mg via INTRAVENOUS

## 2011-10-14 MED ORDER — CIPROFLOXACIN IN D5W 400 MG/200ML IV SOLN
INTRAVENOUS | Status: DC | PRN
  Administered 2011-10-14: 400 mg via INTRAVENOUS

## 2011-10-14 MED ORDER — LACTATED RINGERS IV SOLN
INTRAVENOUS | Status: DC
  Administered 2011-10-14: 07:00:00 via INTRAVENOUS

## 2011-10-14 MED ORDER — PHENAZOPYRIDINE HCL 200 MG PO TABS
ORAL | Status: DC | PRN
  Administered 2011-10-14: 08:00:00 via INTRAVESICAL

## 2011-10-14 SURGICAL SUPPLY — 17 items
BAG DRAIN URO-CYSTO SKYTR STRL (DRAIN) ×2 IMPLANT
BAG DRN UROCATH (DRAIN) ×1
CANISTER SUCT LVC 12 LTR MEDI- (MISCELLANEOUS) IMPLANT
CATH FOLEY 2WAY SLVR  5CC 16FR (CATHETERS) ×1
CATH FOLEY 2WAY SLVR 5CC 16FR (CATHETERS) ×1 IMPLANT
CLOTH BEACON ORANGE TIMEOUT ST (SAFETY) ×2 IMPLANT
DRAPE CAMERA CLOSED 9X96 (DRAPES) ×2 IMPLANT
ELECT REM PT RETURN 9FT ADLT (ELECTROSURGICAL) ×2
ELECTRODE REM PT RTRN 9FT ADLT (ELECTROSURGICAL) ×1 IMPLANT
GLOVE SURG SS PI 8.0 STRL IVOR (GLOVE) ×2 IMPLANT
GOWN STRL REIN XL XLG (GOWN DISPOSABLE) ×2 IMPLANT
NDL SAFETY ECLIPSE 18X1.5 (NEEDLE) ×1 IMPLANT
NEEDLE HYPO 18GX1.5 SHARP (NEEDLE) ×2
NS IRRIG 500ML POUR BTL (IV SOLUTION) IMPLANT
PACK CYSTOSCOPY (CUSTOM PROCEDURE TRAY) ×2 IMPLANT
SYR 30ML LL (SYRINGE) ×2 IMPLANT
WATER STERILE IRR 3000ML UROMA (IV SOLUTION) ×2 IMPLANT

## 2011-10-14 NOTE — Anesthesia Postprocedure Evaluation (Signed)
  Anesthesia Post-op Note  Patient: [name]  Procedure(s) Performed:  CYSTOSCOPY/HYDRODISTENSION - M & P   Patient Location: PACU  Anesthesia Type: General  Level of Consciousness: awake and alert   Airway and Oxygen Therapy: Patient Spontanous Breathing  Post-op Pain: mild  Post-op Assessment: Post-op Vital signs reviewed, Patient's Cardiovascular Status Stable, Respiratory Function Stable, Patent Airway and No signs of Nausea or vomiting  Post-op Vital Signs: stable  Complications: No apparent anesthesia complications

## 2011-10-14 NOTE — Transfer of Care (Signed)
Immediate Anesthesia Transfer of Care Note  Patient: [name]  Procedure(s) Performed:  CYSTOSCOPY/HYDRODISTENSION - M & P   Patient Location: PACU  Anesthesia Type: General  Level of Consciousness: awake, sedated, patient cooperative and responds to stimulation  Airway & Oxygen Therapy: Patient Spontanous Breathing and Patient connected to face mask oxygen  Post-op Assessment: Report given to PACU RN, Post -op Vital signs reviewed and stable and Patient moving all extremities  Post vital signs: Reviewed and stable  Complications: No apparent anesthesia complications

## 2011-10-14 NOTE — Interval H&P Note (Signed)
History and Physical Interval Note:  10/14/2011 7:19 AM  [name]  has presented today for surgery, with the diagnosis of BLADDER PAIN  The various methods of treatment have been discussed with the patient and family. After consideration of risks, benefits and other options for treatment, the patient has consented to  Procedure(s): CYSTOSCOPY/HYDRODISTENSION as a surgical intervention .  The patients' history has been reviewed, patient examined, no change in status, stable for surgery.  I have reviewed the patients' chart and labs.  Questions were answered to the patient's satisfaction.     Decklin Weddington J

## 2011-10-14 NOTE — Anesthesia Procedure Notes (Signed)
Procedure Name: LMA Insertion Date/Time: 10/14/2011 7:38 AM Performed by: Iline Oven Pre-anesthesia Checklist: Patient identified, Emergency Drugs available, Suction available and Patient being monitored Patient Re-evaluated:Patient Re-evaluated prior to inductionOxygen Delivery Method: Circle System Utilized Preoxygenation: Pre-oxygenation with 100% oxygen Intubation Type: IV induction Ventilation: Mask ventilation without difficulty LMA: LMA inserted LMA Size: 4.0 Number of attempts: 1 Airway Equipment and Method: bite block Placement Confirmation: positive ETCO2 Tube secured with: Tape Dental Injury: Teeth and Oropharynx as per pre-operative assessment

## 2011-10-14 NOTE — Brief Op Note (Signed)
10/14/2011  8:01 AM  PATIENT:  [name]  26 y.o. female  PRE-OPERATIVE DIAGNOSIS:  BLADDER PAIN  POST-OPERATIVE DIAGNOSIS:  bladder pain with interstitial cystitis.   PROCEDURE:  Procedure(s): CYSTOSCOPY/HYDRODISTENSION/INSTILLATION OF PYRIDIUM AND MARCAINE  SURGEON:  Surgeon(s): Anner Crete, MD  PHYSICIAN ASSISTANT:   ASSISTANTS: none   ANESTHESIA:   general  EBL:     BLOOD ADMINISTERED:none  DRAINS: none   LOCAL MEDICATIONS USED:  MARCAINE 0.25% 30CC  SPECIMEN:  No Specimen  DISPOSITION OF SPECIMEN:  N/A  COUNTS:  YES  TOURNIQUET:  * No tourniquets in log *  DICTATION: .Other Dictation: Dictation Number [id number]  PLAN OF CARE: Discharge to home after PACU  PATIENT DISPOSITION:  PACU - hemodynamically stable.   Delay start of Pharmacological VTE agent (>24hrs) due to surgical blood loss or risk of bleeding:  {YES/NO/NOT APPLICABLE:20182

## 2011-10-15 ENCOUNTER — Encounter (HOSPITAL_BASED_OUTPATIENT_CLINIC_OR_DEPARTMENT_OTHER): Payer: Self-pay | Admitting: Urology

## 2011-10-15 NOTE — Op Note (Signed)
NAMEASHETON, VIRAMONTES                 ACCOUNT NO.:  [id number]  MEDICAL RECORD NO.:  [id number]  LOCATION:                                 FACILITY:  PHYSICIAN:  Excell Seltzer. Annabell Howells, M.D.    DATE OF BIRTH:  01-28-1986  DATE OF PROCEDURE: DATE OF DISCHARGE:                              OPERATIVE REPORT   Patient of Dr. Bjorn Pippin.  PREOPERATIVE DIAGNOSIS:  Bladder pain.  POSTOPERATIVE DIAGNOSIS:  Bladder pain with interstitial cystitis.  PROCEDURE:  Cystoscopy, hydrodistention of the bladder, installation of Pyridium and Marcaine.  SURGEON:  Excell Seltzer. Annabell Howells, M.D.  ANESTHESIA:  General.  SPECIMEN:  None.  DRAINS:  None.  COMPLICATIONS:  None.  INDICATIONS:  [name] is a 26 year old white female with chronic pelvic pain and a tender bladder on exam.  He is felt to have probable interstitial cystitis.  FINDINGS OF PROCEDURE:  She was given Cipro.  She was taken to the operating room, where general anesthetic was induced.  She was placed in a lithotomy position.  Her perineum and genitalia were prepped with Betadine solution and she was draped in the usual sterile fashion. Cystoscopy was performed using a 22-French scope and 12-degree lens. Examination revealed normal urethra.  The bladder wall had mild trabeculation.  The mucosa was pale without lesions.  Ureteral orifices were on unremarkable.  After thorough cystoscopic inspection of the bladder, the bladder was dilated to capacity under 80 cm of water pressure, this was held for 3 minutes and the bladder was drained.  Her capacity under anesthesia was 800 cc and the terminal efflux was bloody.  Repeat cystoscopy after the hydrodistention revealed scattered glomerular hemorrhages consistent with interstitial cystitis.  There were most prominent on the posterior wall and trigone.  At this point, the bladder was drained and a red rubber catheter was placed.  The bladder was instilled with 30 cc of 0.25% Marcaine with 400 mg of  crushed Pyridium admixed in the solution.  The catheter was removed.  The patient was taken down from lithotomy position.  Her anesthetic was reversed.  He was moved to recovery room in stable condition.  There were no complications.     Excell Seltzer. Annabell Howells, M.D.     JJW/MEDQ  D:  10/14/2011  T:  10/15/2011  Job:  [id number]

## 2011-10-15 NOTE — Addendum Note (Signed)
Addendum  created 10/15/11 1101 by Gaetano Hawthorne, MD   Modules edited:Anesthesia Responsible Staff

## 2011-10-15 NOTE — Addendum Note (Signed)
Addendum  created 10/15/11 1101 by Remmi Armenteros L Mckenzey Parcell, MD   Modules edited:Anesthesia Responsible Staff    

## 2013-09-13 HISTORY — PX: NOSE SURGERY: SHX723

## 2014-11-14 ENCOUNTER — Other Ambulatory Visit: Payer: Self-pay | Admitting: Obstetrics and Gynecology

## 2014-11-14 ENCOUNTER — Inpatient Hospital Stay (HOSPITAL_COMMUNITY)
Admission: AD | Admit: 2014-11-14 | Discharge: 2014-11-14 | Disposition: A | Discharge status: Home / Self Care | Payer: 59 | Source: Ambulatory Visit | Attending: Obstetrics & Gynecology | Admitting: Obstetrics & Gynecology

## 2014-11-14 DIAGNOSIS — O001 Tubal pregnancy: Secondary | ICD-10-CM | POA: Insufficient documentation

## 2014-11-14 LAB — CBC WITH DIFFERENTIAL/PLATELET
BASOS ABS: 0.1 10*3/uL (ref 0.0–0.1)
Basophils Relative: 0 % (ref 0–1)
EOS PCT: 2 % (ref 0–5)
Eosinophils Absolute: 0.2 10*3/uL (ref 0.0–0.7)
HCT: 38.9 % (ref 36.0–46.0)
Hemoglobin: 13.3 g/dL (ref 12.0–15.0)
LYMPHS ABS: 2.2 10*3/uL (ref 0.7–4.0)
LYMPHS PCT: 19 % (ref 12–46)
MCH: 30.4 pg (ref 26.0–34.0)
MCHC: 34.2 g/dL (ref 30.0–36.0)
MCV: 88.8 fL (ref 78.0–100.0)
MONO ABS: 0.6 10*3/uL (ref 0.1–1.0)
MONOS PCT: 5 % (ref 3–12)
NEUTROS ABS: 8.6 10*3/uL — AB (ref 1.7–7.7)
NEUTROS PCT: 74 % (ref 43–77)
PLATELETS: 322 10*3/uL (ref 150–400)
RBC: 4.38 MIL/uL (ref 3.87–5.11)
RDW: 13.1 % (ref 11.5–15.5)
WBC: 11.6 10*3/uL — ABNORMAL HIGH (ref 4.0–10.5)

## 2014-11-14 LAB — CREATININE, SERUM
Creatinine, Ser: 0.75 mg/dL (ref 0.50–1.10)
GFR calc Af Amer: >90 mL/min (ref >90)
GFR calc non Af Amer: >90 mL/min (ref >90)

## 2014-11-14 LAB — BUN: BUN: 13 mg/dL (ref 6–23)

## 2014-11-14 LAB — TYPE AND SCREEN
ABO/RH(D): O POS
Antibody Screen: NEGATIVE

## 2014-11-14 LAB — HCG, QUANTITATIVE, PREGNANCY: hCG, Beta Chain, Quant, S: 710 m[IU]/mL — ABNORMAL HIGH (ref <5)

## 2014-11-14 LAB — AST: AST: 20 U/L (ref 0–37)

## 2014-11-14 LAB — ABO/RH: ABO/RH(D): O POS

## 2014-11-14 MED ORDER — METHOTREXATE INJECTION FOR WOMEN'S HOSPITAL
50.0000 mg/m2 | Freq: Once | INTRAMUSCULAR | Status: AC
  Administered 2014-11-14: 95 mg via INTRAMUSCULAR
  Filled 2014-11-14: qty 1.9

## 2014-11-14 NOTE — MAU Note (Signed)
Pt sent over for ectopic pregnancy here to get MTX.

## 2014-11-17 ENCOUNTER — Inpatient Hospital Stay (HOSPITAL_COMMUNITY)
Admission: AD | Admit: 2014-11-17 | Discharge: 2014-11-17 | Disposition: A | Discharge status: Home / Self Care | Payer: 59 | Source: Ambulatory Visit | Attending: Obstetrics & Gynecology | Admitting: Obstetrics & Gynecology

## 2014-11-17 ENCOUNTER — Encounter (HOSPITAL_COMMUNITY): Payer: Self-pay | Admitting: *Deleted

## 2014-11-17 DIAGNOSIS — O001 Tubal pregnancy: Secondary | ICD-10-CM | POA: Insufficient documentation

## 2014-11-17 LAB — HCG, QUANTITATIVE, PREGNANCY: hCG, Beta Chain, Quant, S: 948 m[IU]/mL — ABNORMAL HIGH (ref <5)

## 2014-11-17 NOTE — Progress Notes (Signed)
Sunday Corn CNM notified in regards to pt with repeat BHCG for post methotrexate, states pt does not have to wait for results.

## 2014-11-17 NOTE — MAU Note (Signed)
Pt presents to MAU for repeat BHCG, methotrexate was given on Thursday for ectopic pregnancy. Denies any pain, reports small amount of vaginal bleeding.

## 2014-12-18 ENCOUNTER — Other Ambulatory Visit (HOSPITAL_COMMUNITY): Payer: Self-pay | Admitting: Obstetrics and Gynecology

## 2014-12-18 DIAGNOSIS — N971 Female infertility of tubal origin: Secondary | ICD-10-CM

## 2014-12-25 ENCOUNTER — Encounter (INDEPENDENT_AMBULATORY_CARE_PROVIDER_SITE_OTHER): Payer: Self-pay

## 2014-12-25 ENCOUNTER — Ambulatory Visit (HOSPITAL_COMMUNITY)
Admission: RE | Admit: 2014-12-25 | Discharge: 2014-12-25 | Disposition: A | Discharge status: Home / Self Care | Payer: 59 | Source: Ambulatory Visit | Attending: Obstetrics and Gynecology | Admitting: Obstetrics and Gynecology

## 2014-12-25 DIAGNOSIS — N971 Female infertility of tubal origin: Secondary | ICD-10-CM | POA: Insufficient documentation

## 2014-12-25 MED ORDER — IOHEXOL 300 MG/ML  SOLN
20.0000 mL | Freq: Once | INTRAMUSCULAR | Status: AC | PRN
  Administered 2014-12-25: 20 mL

## 2015-01-02 ENCOUNTER — Ambulatory Visit
Admission: RE | Admit: 2015-01-02 | Discharge: 2015-01-02 | Disposition: A | Discharge status: Home / Self Care | Payer: 59 | Source: Ambulatory Visit | Attending: Internal Medicine | Admitting: Internal Medicine

## 2015-01-02 ENCOUNTER — Other Ambulatory Visit: Payer: Self-pay | Admitting: Internal Medicine

## 2015-01-02 DIAGNOSIS — R0781 Pleurodynia: Secondary | ICD-10-CM

## 2015-03-31 ENCOUNTER — Telehealth: Payer: Self-pay | Admitting: Obstetrics and Gynecology

## 2015-09-19 DIAGNOSIS — Z3201 Encounter for pregnancy test, result positive: Secondary | ICD-10-CM | POA: Diagnosis not present

## 2015-09-19 DIAGNOSIS — Z8759 Personal history of other complications of pregnancy, childbirth and the puerperium: Secondary | ICD-10-CM | POA: Diagnosis not present

## 2015-09-19 MED FILL — BUTALBITAL/APAP/CAFFEINE TB: 50-325-40 | 3 days supply | Qty: 30 | Fill #0

## 2015-09-19 MED FILL — PROGESTERONE 200 MG CAPSULE: 200 | 30 days supply | Qty: 30 | Fill #0

## 2015-09-23 DIAGNOSIS — Z3A01 Less than 8 weeks gestation of pregnancy: Secondary | ICD-10-CM | POA: Diagnosis not present

## 2015-09-23 DIAGNOSIS — O2620 Pregnancy care for patient with recurrent pregnancy loss, unspecified trimester: Secondary | ICD-10-CM | POA: Diagnosis not present

## 2015-09-24 DIAGNOSIS — N96 Recurrent pregnancy loss: Secondary | ICD-10-CM | POA: Diagnosis not present

## 2015-09-25 MED FILL — FOLIC ACID 1 MG TABLET: 1 | 30 days supply | Qty: 120 | Fill #1

## 2015-10-08 MED FILL — LORazepam 0.5 MG TABS: 0.5 | 30 days supply | Qty: 60 | Fill #1

## 2015-10-13 MED FILL — FROVATRIPTAN SUCC 2.5 MG TA: 2.5 | 90 days supply | Qty: 27 | Fill #0

## 2015-10-21 MED FILL — CLOMIPHENE CITRATE 50 MG TA: 50 | 28 days supply | Qty: 5 | Fill #0

## 2015-10-27 MED FILL — FOLIC ACID 1 MG TABLET: 1 | 30 days supply | Qty: 120 | Fill #2

## 2015-11-03 DIAGNOSIS — Z6830 Body mass index (BMI) 30.0-30.9, adult: Secondary | ICD-10-CM | POA: Diagnosis not present

## 2015-11-03 DIAGNOSIS — E668 Other obesity: Secondary | ICD-10-CM | POA: Diagnosis not present

## 2015-11-03 DIAGNOSIS — E039 Hypothyroidism, unspecified: Secondary | ICD-10-CM | POA: Diagnosis not present

## 2015-11-03 DIAGNOSIS — Z658 Other specified problems related to psychosocial circumstances: Secondary | ICD-10-CM | POA: Diagnosis not present

## 2015-11-04 DIAGNOSIS — N96 Recurrent pregnancy loss: Secondary | ICD-10-CM | POA: Diagnosis not present

## 2015-11-05 DIAGNOSIS — B349 Viral infection, unspecified: Secondary | ICD-10-CM | POA: Diagnosis not present

## 2015-11-12 DIAGNOSIS — N96 Recurrent pregnancy loss: Secondary | ICD-10-CM | POA: Diagnosis not present

## 2015-11-12 DIAGNOSIS — Z3149 Encounter for other procreative investigation and testing: Secondary | ICD-10-CM | POA: Diagnosis not present

## 2015-11-20 MED FILL — CLOMIPHENE CITRATE 50 MG TA: 50 | 28 days supply | Qty: 5 | Fill #0

## 2015-12-04 DIAGNOSIS — D1724 Benign lipomatous neoplasm of skin and subcutaneous tissue of left leg: Secondary | ICD-10-CM | POA: Diagnosis not present

## 2015-12-04 MED FILL — FOLIC ACID 1 MG TABLET: 1 | 30 days supply | Qty: 120 | Fill #3

## 2015-12-04 MED FILL — LORazepam 0.5 MG TABS: 0.5 | 30 days supply | Qty: 60 | Fill #0

## 2015-12-22 MED FILL — CLOMIPHENE CITRATE 50 MG TA: 50 | 5 days supply | Qty: 5 | Fill #0

## 2015-12-30 MED FILL — FROVATRIPTAN SUCC 2.5 MG TA: 2.5 | 90 days supply | Qty: 27 | Fill #1

## 2015-12-30 MED FILL — FOLIC ACID 1 MG TABLET: 1 | 30 days supply | Qty: 120 | Fill #4

## 2016-01-09 MED FILL — FLUCONAZOLE 150 MG TABLET: 150 | 5 days supply | Qty: 2 | Fill #0

## 2016-01-14 MED FILL — PROGESTERONE 200 MG CAPSULE: 200 | 30 days supply | Qty: 30 | Fill #1

## 2016-01-19 DIAGNOSIS — Z3201 Encounter for pregnancy test, result positive: Secondary | ICD-10-CM | POA: Diagnosis not present

## 2016-01-19 DIAGNOSIS — Z3A01 Less than 8 weeks gestation of pregnancy: Secondary | ICD-10-CM | POA: Diagnosis not present

## 2016-01-19 DIAGNOSIS — O2620 Pregnancy care for patient with recurrent pregnancy loss, unspecified trimester: Secondary | ICD-10-CM | POA: Diagnosis not present

## 2016-01-21 DIAGNOSIS — Z3A01 Less than 8 weeks gestation of pregnancy: Secondary | ICD-10-CM | POA: Diagnosis not present

## 2016-01-21 DIAGNOSIS — O2620 Pregnancy care for patient with recurrent pregnancy loss, unspecified trimester: Secondary | ICD-10-CM | POA: Diagnosis not present

## 2016-01-23 DIAGNOSIS — O2620 Pregnancy care for patient with recurrent pregnancy loss, unspecified trimester: Secondary | ICD-10-CM | POA: Diagnosis not present

## 2016-01-23 DIAGNOSIS — Z3A01 Less than 8 weeks gestation of pregnancy: Secondary | ICD-10-CM | POA: Diagnosis not present

## 2016-02-02 DIAGNOSIS — M6248 Contracture of muscle, other site: Secondary | ICD-10-CM | POA: Diagnosis not present

## 2016-02-02 DIAGNOSIS — E668 Other obesity: Secondary | ICD-10-CM | POA: Diagnosis not present

## 2016-02-02 DIAGNOSIS — Z331 Pregnant state, incidental: Secondary | ICD-10-CM | POA: Diagnosis not present

## 2016-02-02 DIAGNOSIS — G43009 Migraine without aura, not intractable, without status migrainosus: Secondary | ICD-10-CM | POA: Diagnosis not present

## 2016-02-02 DIAGNOSIS — F410 Panic disorder [episodic paroxysmal anxiety] without agoraphobia: Secondary | ICD-10-CM | POA: Diagnosis not present

## 2016-02-02 DIAGNOSIS — K219 Gastro-esophageal reflux disease without esophagitis: Secondary | ICD-10-CM | POA: Diagnosis not present

## 2016-02-02 DIAGNOSIS — Z6828 Body mass index (BMI) 28.0-28.9, adult: Secondary | ICD-10-CM | POA: Diagnosis not present

## 2016-02-03 MED FILL — FOLIC ACID 1 MG TABLET: 1 | 30 days supply | Qty: 120 | Fill #5

## 2016-02-05 DIAGNOSIS — Z3201 Encounter for pregnancy test, result positive: Secondary | ICD-10-CM | POA: Diagnosis not present

## 2016-02-11 MED FILL — metroNIDAZOLE 0.75 % GEL: 0.75 | 5 days supply | Qty: 70 | Fill #0

## 2016-02-11 MED FILL — PROGESTERONE 200 MG CAPSULE: 200 | 30 days supply | Qty: 30 | Fill #2

## 2016-02-12 MED FILL — BUTALB-ACETAMIN-CAFF 50-325: 50-325-40 | 5 days supply | Qty: 30 | Fill #0

## 2016-02-18 DIAGNOSIS — O368910 Maternal care for other specified fetal problems, first trimester, not applicable or unspecified: Secondary | ICD-10-CM | POA: Diagnosis not present

## 2016-02-18 DIAGNOSIS — Z3A01 Less than 8 weeks gestation of pregnancy: Secondary | ICD-10-CM | POA: Diagnosis not present

## 2016-02-24 DIAGNOSIS — Z3A01 Less than 8 weeks gestation of pregnancy: Secondary | ICD-10-CM | POA: Diagnosis not present

## 2016-02-24 DIAGNOSIS — O021 Missed abortion: Secondary | ICD-10-CM | POA: Diagnosis not present

## 2016-02-24 MED FILL — FLUCONAZOLE 150 MG TABLET: 150 | 4 days supply | Qty: 2 | Fill #0

## 2016-02-25 ENCOUNTER — Encounter (HOSPITAL_COMMUNITY): Payer: Self-pay | Admitting: *Deleted

## 2016-02-25 ENCOUNTER — Other Ambulatory Visit: Payer: Self-pay | Admitting: Obstetrics and Gynecology

## 2016-02-26 ENCOUNTER — Ambulatory Visit (HOSPITAL_COMMUNITY): Payer: 59 | Admitting: Anesthesiology

## 2016-02-26 ENCOUNTER — Encounter (HOSPITAL_COMMUNITY)
Admission: RE | Disposition: A | Discharge status: Home / Self Care | Payer: Self-pay | Source: Ambulatory Visit | Attending: Obstetrics and Gynecology

## 2016-02-26 ENCOUNTER — Encounter (HOSPITAL_COMMUNITY): Payer: Self-pay

## 2016-02-26 ENCOUNTER — Ambulatory Visit (HOSPITAL_COMMUNITY)
Admission: RE | Admit: 2016-02-26 | Discharge: 2016-02-26 | Disposition: A | Discharge status: Home / Self Care | Payer: 59 | Source: Ambulatory Visit | Attending: Obstetrics and Gynecology | Admitting: Obstetrics and Gynecology

## 2016-02-26 DIAGNOSIS — N3010 Interstitial cystitis (chronic) without hematuria: Secondary | ICD-10-CM | POA: Insufficient documentation

## 2016-02-26 DIAGNOSIS — Z881 Allergy status to other antibiotic agents status: Secondary | ICD-10-CM | POA: Diagnosis not present

## 2016-02-26 DIAGNOSIS — Z87891 Personal history of nicotine dependence: Secondary | ICD-10-CM | POA: Insufficient documentation

## 2016-02-26 DIAGNOSIS — F419 Anxiety disorder, unspecified: Secondary | ICD-10-CM | POA: Diagnosis not present

## 2016-02-26 DIAGNOSIS — O021 Missed abortion: Secondary | ICD-10-CM | POA: Diagnosis not present

## 2016-02-26 DIAGNOSIS — Z880 Allergy status to penicillin: Secondary | ICD-10-CM | POA: Diagnosis not present

## 2016-02-26 HISTORY — DX: Unspecified asthma, uncomplicated: J45.909

## 2016-02-26 HISTORY — PX: DILATION AND EVACUATION: SHX1459

## 2016-02-26 HISTORY — DX: Headache, unspecified: R51.9

## 2016-02-26 HISTORY — DX: Chronic kidney disease, unspecified: N18.9

## 2016-02-26 HISTORY — DX: Headache: R51

## 2016-02-26 LAB — CBC
HEMATOCRIT: 37.8 % (ref 36.0–46.0)
HEMOGLOBIN: 13.4 g/dL (ref 12.0–15.0)
MCH: 30.9 pg (ref 26.0–34.0)
MCHC: 35.4 g/dL (ref 30.0–36.0)
MCV: 87.3 fL (ref 78.0–100.0)
Platelets: 285 10*3/uL (ref 150–400)
RBC: 4.33 MIL/uL (ref 3.87–5.11)
RDW: 13 % (ref 11.5–15.5)
WBC: 8.1 10*3/uL (ref 4.0–10.5)

## 2016-02-26 SURGERY — DILATION AND EVACUATION, UTERUS
Anesthesia: Monitor Anesthesia Care

## 2016-02-26 MED ORDER — HYDROCODONE-ACETAMINOPHEN 5-325 MG PO TABS
1.0000 | ORAL_TABLET | Freq: Once | ORAL | Status: AC
  Administered 2016-02-26: 1 via ORAL

## 2016-02-26 MED ORDER — LACTATED RINGERS IV SOLN
INTRAVENOUS | Status: DC
  Administered 2016-02-26: 12:00:00 via INTRAVENOUS

## 2016-02-26 MED ORDER — SCOPOLAMINE 1 MG/3DAYS TD PT72
MEDICATED_PATCH | TRANSDERMAL | Status: AC
  Administered 2016-02-26: 1.5 mg via TRANSDERMAL
  Filled 2016-02-26: qty 1

## 2016-02-26 MED ORDER — MEPERIDINE HCL 25 MG/ML IJ SOLN: 6.2500 mg | INTRAMUSCULAR | Status: DC | PRN

## 2016-02-26 MED ORDER — MIDAZOLAM HCL 2 MG/2ML IJ SOLN
INTRAMUSCULAR | Status: DC | PRN
  Administered 2016-02-26: 2 mg via INTRAVENOUS

## 2016-02-26 MED ORDER — PROPOFOL 10 MG/ML IV BOLUS
INTRAVENOUS | Status: AC
  Filled 2016-02-26: qty 20

## 2016-02-26 MED ORDER — FENTANYL CITRATE (PF) 250 MCG/5ML IJ SOLN
INTRAMUSCULAR | Status: AC
  Filled 2016-02-26: qty 5

## 2016-02-26 MED ORDER — CHLOROPROCAINE HCL 1 % IJ SOLN
INTRAMUSCULAR | Status: AC
  Filled 2016-02-26: qty 30

## 2016-02-26 MED ORDER — ONDANSETRON HCL 4 MG/2ML IJ SOLN
INTRAMUSCULAR | Status: AC
  Filled 2016-02-26: qty 2

## 2016-02-26 MED ORDER — DEXAMETHASONE SODIUM PHOSPHATE 4 MG/ML IJ SOLN
INTRAMUSCULAR | Status: DC | PRN
  Administered 2016-02-26: 4 mg via INTRAVENOUS

## 2016-02-26 MED ORDER — LIDOCAINE HCL (CARDIAC) 20 MG/ML IV SOLN
INTRAVENOUS | Status: AC
  Filled 2016-02-26: qty 5

## 2016-02-26 MED ORDER — PROMETHAZINE HCL 25 MG/ML IJ SOLN: 6.2500 mg | INTRAMUSCULAR | Status: DC | PRN

## 2016-02-26 MED ORDER — MIDAZOLAM HCL 2 MG/2ML IJ SOLN
INTRAMUSCULAR | Status: AC
  Filled 2016-02-26: qty 2

## 2016-02-26 MED ORDER — LACTATED RINGERS IV SOLN: INTRAVENOUS | Status: DC

## 2016-02-26 MED ORDER — BUPIVACAINE HCL (PF) 0.25 % IJ SOLN
INTRAMUSCULAR | Status: DC | PRN
  Administered 2016-02-26: 20 mL

## 2016-02-26 MED ORDER — HYDROCODONE-ACETAMINOPHEN 5-325 MG PO TABS
ORAL_TABLET | ORAL | Status: AC
  Filled 2016-02-26: qty 1

## 2016-02-26 MED ORDER — SCOPOLAMINE 1 MG/3DAYS TD PT72
1.0000 | MEDICATED_PATCH | Freq: Once | TRANSDERMAL | Status: DC
  Administered 2016-02-26: 1.5 mg via TRANSDERMAL

## 2016-02-26 MED ORDER — CEFAZOLIN SODIUM-DEXTROSE 2-4 GM/100ML-% IV SOLN
2.0000 g | INTRAVENOUS | Status: DC
  Administered 2016-02-26: 2 g via INTRAVENOUS

## 2016-02-26 MED ORDER — FENTANYL CITRATE (PF) 100 MCG/2ML IJ SOLN: 25.0000 ug | INTRAMUSCULAR | Status: DC | PRN

## 2016-02-26 MED ORDER — KETOROLAC TROMETHAMINE 30 MG/ML IJ SOLN
INTRAMUSCULAR | Status: DC | PRN
  Administered 2016-02-26: 30 mg via INTRAVENOUS

## 2016-02-26 MED ORDER — KETOROLAC TROMETHAMINE 30 MG/ML IJ SOLN
INTRAMUSCULAR | Status: AC
  Filled 2016-02-26: qty 1

## 2016-02-26 MED ORDER — PROPOFOL 500 MG/50ML IV EMUL
INTRAVENOUS | Status: DC | PRN
  Administered 2016-02-26 (×2): 50 mg via INTRAVENOUS
  Administered 2016-02-26: 20 mg via INTRAVENOUS
  Administered 2016-02-26: 30 mg via INTRAVENOUS

## 2016-02-26 MED ORDER — LIDOCAINE HCL (CARDIAC) 20 MG/ML IV SOLN
INTRAVENOUS | Status: DC | PRN
  Administered 2016-02-26: 40 mg via INTRAVENOUS

## 2016-02-26 MED ORDER — TRAMADOL HCL 50 MG PO TABS: 50.0000 mg | ORAL_TABLET | Freq: Four times a day (QID) | ORAL | Status: DC | PRN

## 2016-02-26 MED ORDER — ONDANSETRON HCL 4 MG/2ML IJ SOLN
INTRAMUSCULAR | Status: DC | PRN
  Administered 2016-02-26: 4 mg via INTRAVENOUS

## 2016-02-26 MED ORDER — FENTANYL CITRATE (PF) 100 MCG/2ML IJ SOLN
INTRAMUSCULAR | Status: DC | PRN
  Administered 2016-02-26 (×5): 50 ug via INTRAVENOUS

## 2016-02-26 MED ORDER — DEXAMETHASONE SODIUM PHOSPHATE 4 MG/ML IJ SOLN
INTRAMUSCULAR | Status: AC
  Filled 2016-02-26: qty 1

## 2016-02-26 MED FILL — traMADol HCL 50 MG TABS: 50 | 4 days supply | Qty: 30 | Fill #0

## 2016-02-26 SURGICAL SUPPLY — 18 items
CATH ROBINSON RED A/P 16FR (CATHETERS) ×2 IMPLANT
CLOTH BEACON ORANGE TIMEOUT ST (SAFETY) ×2 IMPLANT
DECANTER SPIKE VIAL GLASS SM (MISCELLANEOUS) ×2 IMPLANT
GLOVE BIO SURGEON STRL SZ7.5 (GLOVE) ×2 IMPLANT
GLOVE BIOGEL PI IND STRL 7.0 (GLOVE) ×1 IMPLANT
GLOVE BIOGEL PI INDICATOR 7.0 (GLOVE) ×1
GOWN STRL REUS W/TWL LRG LVL3 (GOWN DISPOSABLE) ×4 IMPLANT
KIT BERKELEY 1ST TRIMESTER 3/8 (MISCELLANEOUS) ×2 IMPLANT
NS IRRIG 1000ML POUR BTL (IV SOLUTION) ×2 IMPLANT
PACK VAGINAL MINOR WOMEN LF (CUSTOM PROCEDURE TRAY) ×2 IMPLANT
PAD OB MATERNITY 4.3X12.25 (PERSONAL CARE ITEMS) ×2 IMPLANT
PAD PREP 24X48 CUFFED NSTRL (MISCELLANEOUS) ×2 IMPLANT
SET BERKELEY SUCTION TUBING (SUCTIONS) ×2 IMPLANT
TOWEL OR 17X24 6PK STRL BLUE (TOWEL DISPOSABLE) ×4 IMPLANT
VACURETTE 10 RIGID CVD (CANNULA) IMPLANT
VACURETTE 7MM CVD STRL WRAP (CANNULA) ×1 IMPLANT
VACURETTE 8 RIGID CVD (CANNULA) IMPLANT
VACURETTE 9 RIGID CVD (CANNULA) IMPLANT

## 2016-02-26 NOTE — Progress Notes (Signed)
Patient ID: [name], female   DOB: August 30, 1986, 30 y.o.   MRN: [id number] Patient seen and examined. Consent witnessed and signed. No changes noted. Update completed. CBC    Component Value Date/Time   WBC 8.1 02/26/2016 1111   RBC 4.33 02/26/2016 1111   HGB 13.4 02/26/2016 1111   HCT 37.8 02/26/2016 1111   PLT 285 02/26/2016 1111   MCV 87.3 02/26/2016 1111   MCH 30.9 02/26/2016 1111   MCHC 35.4 02/26/2016 1111   RDW 13.0 02/26/2016 1111   LYMPHSABS 2.2 11/14/2014 1612   MONOABS 0.6 11/14/2014 1612   EOSABS 0.2 11/14/2014 1612   BASOSABS 0.1 11/14/2014 1612

## 2016-02-26 NOTE — Op Note (Signed)
[name], [name]                ACCOUNT NO.:  [id number]  MEDICAL RECORD NO.:  [id number]  LOCATION:  [facility]                          FACILITY:  [facility]  PHYSICIAN:  Lovenia Kim, M.D.DATE OF BIRTH:  1985/12/13  DATE OF PROCEDURE: DATE OF DISCHARGE:  02/26/2016                              OPERATIVE REPORT   PREOPERATIVE DIAGNOSIS:  Missed abortion and also recurrent pregnancy loss.  PROCEDURE:  Suction D and E with tissue sent for chromosome.  SURGEON:  Lovenia Kim, M.D.  ASSISTANT:  None.  ANESTHESIA:  Local and MAC.  ESTIMATED BLOOD LOSS:  Less than 50 mL.  COMPLICATIONS:  None.  DRAINS:  None.  COUNTS:  Correct.  DISPOSITION:  The patient to recovery in good condition.  TISSUE:  Products of conception sent for chromosomal analysis.  BRIEF OPERATIVE NOTE:  After being apprised of the risks of anesthesia, infection, bleeding, injury to surrounding organs, possible need for repair, delayed versus immediate complications to include bowel and bladder injury, possible need for repair.  The patient was brought to the operating room where she was administered IV sedation without difficulty.  Prepped and draped in usual sterile fashion.  Catheterized until the bladder was empty.  Exam under anesthesia reveals 6 to 8 week size anteflexed uterus and no adnexal masses.  At this time, a dilute Nesacaine solution was placed 20 mL total standard paracervical block. Cervix dilated easily up to a 23 Pratt dilator.  7 mm suction curette placed.  Products of conception aspirated without difficulty and visualized and collected for confirmation via chromosomal analysis.  At this time, repeat suction and blunt curettage in a 4-quadrant method reveals the cavity to be empty.  Good hemostasis was noted.  All instruments were removed.  The patient tolerated the procedure well.  She was given Toradol and transferred to recovery in good condition.     Lovenia Kim,  M.D.     RJT/MEDQ  D:  02/26/2016  T:  02/26/2016  Job:  [id number]  cc:   Lovenia Kim, M.D. Fax: [phone]

## 2016-02-26 NOTE — Discharge Instructions (Signed)
DISCHARGE INSTRUCTIONS: D&C / D&E The following instructions have been prepared to help you care for yourself upon your return home.   Personal hygiene:  Use sanitary pads for vaginal drainage, not tampons.  Shower the day after your procedure.  NO tub baths, pools or Jacuzzis for 2-3 weeks.  Wipe front to back after using the bathroom.  Activity and limitations:  Do NOT drive or operate any equipment for 24 hours. The effects of anesthesia are still present and drowsiness may result.  Do NOT rest in bed all day.  Walking is encouraged.  Walk up and down stairs slowly.  You may resume your normal activity in one to two days or as indicated by your physician.  Sexual activity: NO intercourse for at least 2 weeks after the procedure, or as indicated by your physician.  Diet: Eat a light meal as desired this evening. You may resume your usual diet tomorrow.  Return to work: You may resume your work activities in one to two days or as indicated by your doctor.  What to expect after your surgery: Expect to have vaginal bleeding/discharge for 2-3 days and spotting for up to 10 days. It is not unusual to have soreness for up to 1-2 weeks. You may have a slight burning sensation when you urinate for the first day. Mild cramps may continue for a couple of days. You may have a regular period in 2-6 weeks.  NO IBUPROFEN PRODUCTS (MOTRIN, ADVIL) OR ALEVE UNTIL 7:00PM TODAY.  SCOPE PATCH MAY BE REMOVED ON OR BEFORE 02/29/16.   Call your doctor for any of the following:  Excessive vaginal bleeding, saturating and changing one pad every hour.  Inability to urinate 6 hours after discharge from hospital.  Pain not relieved by pain medication.  Fever of 100.4 F or greater.  Unusual vaginal discharge or odor.   Call for an appointment:    Patients signature: ______________________  Nurses signature ________________________  Support person's  signature_______________________

## 2016-02-26 NOTE — Op Note (Signed)
02/26/2016  1:07 PM  PATIENT:  [name]  30 y.o. female  PRE-OPERATIVE DIAGNOSIS:  Missed Abortion, Recurrent Pregnancy Loss  POST-OPERATIVE DIAGNOSIS:  MISSED ABORTION  RECURRENT PREGNANCY LOSS  PROCEDURE:  Procedure(s): DILATATION AND EVACUATION  POC for Chromosome Analysis  SURGEON:  Surgeon(s): Brien Few, MD  ASSISTANTS: none   ANESTHESIA:   local and MAC  ESTIMATED BLOOD LOSS: * No blood loss amount entered *   DRAINS: none   LOCAL MEDICATIONS USED:  LIDOCAINE  and Amount: 20 ml  SPECIMEN:  Source of Specimen:  POC  DISPOSITION OF SPECIMEN:  PATHOLOGY  COUNTS:  YES  DICTATION #[id number]  PLAN OF CARE: dc home  PATIENT DISPOSITION:  PACU - hemodynamically stable.

## 2016-02-26 NOTE — Transfer of Care (Signed)
Immediate Anesthesia Transfer of Care Note  Patient: [name]  Procedure(s) Performed: Procedure(s) with comments: DILATATION AND EVACUATION/Chromosome Analysis (N/A) - with chromosome studies   Patient Location: PACU  Anesthesia Type:MAC  Level of Consciousness: awake, alert  and oriented  Airway & Oxygen Therapy: Patient Spontanous Breathing  Post-op Assessment: Report given to RN and Post -op Vital signs reviewed and stable  Post vital signs: Reviewed and stable  Last Vitals:  Filed Vitals:   02/26/16 1121  BP: 122/86  Pulse: 84  Resp: 16    Last Pain: There were no vitals filed for this visit.    Patients Stated Pain Goal: 3 ([id number])  Complications: No apparent anesthesia complications

## 2016-02-26 NOTE — Anesthesia Postprocedure Evaluation (Signed)
Anesthesia Post Note  Patient: [name]  Procedure(s) Performed: Procedure(s) (LRB): DILATATION AND EVACUATION/Chromosome Analysis (N/A)  Patient location during evaluation: PACU Anesthesia Type: MAC Level of consciousness: awake and alert Pain management: pain level controlled Vital Signs Assessment: post-procedure vital signs reviewed and stable Respiratory status: spontaneous breathing, nonlabored ventilation, respiratory function stable and patient connected to nasal cannula oxygen Cardiovascular status: stable and blood pressure returned to baseline Anesthetic complications: no     Last Vitals:  Filed Vitals:   02/26/16 1345 02/26/16 1353  BP: 96/62 101/62  Pulse: 60 63  Temp:    Resp: 16 16    Last Pain:  Filed Vitals:   02/26/16 1353  PainSc: 3    Pain Goal: Patients Stated Pain Goal: 3 (02/26/16 1121)               Effie Berkshire

## 2016-02-26 NOTE — H&P (Signed)
[name] is an 30 y.o. female. MaB for D&E. History of RPL  Pertinent Gynecological History: Menses: flow is light Bleeding: na Contraception: none DES exposure: denies Blood transfusions: none Sexually transmitted diseases: no past history Previous GYN Procedures: DNC  Last mammogram: normal Date: 17 Last pap: normal Date: 17 OB History: G4, P0   Menstrual History: Menarche age: 66  No LMP recorded.    Past Medical History  Diagnosis Date  . Anxiety   . Frequency of urination   . Urgency of urination   . Nocturia   . Pelvic pain in female   . Spotting between menses   . Asthma     CHILDHOOD  . Chronic kidney disease     INTERTERSIAL CYSTITIS  . Headache     MIGRAINES     Past Surgical History  Procedure Laterality Date  . Wisdom tooth extraction  2008- ORAL SURG OFFICE  . Laparoscopy  09/16/2011    Procedure: LAPAROSCOPY OPERATIVE;  Surgeon: Gus Height;  Location: [facility];  Service: Gynecology;  Laterality: N/A;  . Dilation and curettage of uterus  09/16/2011    Procedure: DILATATION AND CURETTAGE;  Surgeon: Gus Height;  Location: [facility];  Service: Gynecology;  Laterality: N/A;  Endometrial Biopsy, Urethral Dilation  . Cysto with hydrodistension  10/14/2011    Procedure: CYSTOSCOPY/HYDRODISTENSION;  Surgeon: Malka So, MD;  Location: [facility];  Service: Urology;  Laterality: N/A;  M & P   . Nose surgery  2015    History reviewed. No pertinent family history.  Social History:  reports that she quit smoking about 5 years ago. Her smoking use included Cigarettes. She has a .075 pack-year smoking history. She has never used smokeless tobacco. She reports that she does not drink alcohol or use illicit drugs.  Allergies:  Allergies  Allergen Reactions  . Macrobid [employer] Macro] Other (See Comments)    FLU LIKE SYMPTONS  . Penicillins Hives    No prescriptions prior to admission    Review of Systems  Constitutional:  Negative.   All other systems reviewed and are negative.   Height 5\' 4"  (1.626 m), weight 78.472 kg (173 lb), unknown if currently breastfeeding. Physical Exam  Nursing note and vitals reviewed. Constitutional: She appears well-developed and well-nourished.  HENT:  Head: Normocephalic and atraumatic.  Neck: Normal range of motion. Neck supple.  Cardiovascular: Normal rate and regular rhythm.   Respiratory: Effort normal and breath sounds normal.  GI: Soft. Bowel sounds are normal.  Genitourinary: Vagina normal and uterus normal.  Musculoskeletal: Normal range of motion.  Neurological: She is alert.  Skin: Skin is warm and dry.  Psychiatric: She has a normal mood and affect.    No results found for this or any previous visit (from the past 24 hour(s)).  No results found.  Assessment/Plan: MAB for D&E RPL- tissue for chromosomes Consent done.  [name] J 02/26/2016, 9:11 AM

## 2016-02-26 NOTE — Anesthesia Preprocedure Evaluation (Addendum)
Anesthesia Evaluation  Patient identified by MRN, date of birth, ID band Patient awake    Reviewed: Allergy & Precautions, NPO status , Patient's Chart, lab work & pertinent test results  Airway Mallampati: I  TM Distance: >3 FB Neck ROM: Full    Dental  (+) Teeth Intact   Pulmonary former smoker,    breath sounds clear to auscultation       Cardiovascular negative cardio ROS   Rhythm:Regular Rate:Normal     Neuro/Psych  Headaches, PSYCHIATRIC DISORDERS Anxiety    GI/Hepatic negative GI ROS, Neg liver ROS,   Endo/Other  negative endocrine ROS  Renal/GU   negative genitourinary   Musculoskeletal negative musculoskeletal ROS (+)   Abdominal   Peds negative pediatric ROS (+)  Hematology negative hematology ROS (+)   Anesthesia Other Findings   Reproductive/Obstetrics (+) Pregnancy                           Lab Results  Component Value Date   WBC 11.6* 11/14/2014   HGB 13.3 11/14/2014   HCT 38.9 11/14/2014   MCV 88.8 11/14/2014   PLT 322 11/14/2014   Lab Results  Component Value Date   CREATININE 0.75 11/14/2014   BUN 13 11/14/2014   NA 135 09/13/2011   K 4.3 09/13/2011   CL 103 09/13/2011   CO2 23 09/13/2011   No results found for: INR, PROTIME   Anesthesia Physical Anesthesia Plan  ASA: II  Anesthesia Plan: MAC   Post-op Pain Management:    Induction: Intravenous  Airway Management Planned: Natural Airway  Additional Equipment:   Intra-op Plan:   Post-operative Plan:   Informed Consent: I have reviewed the patients History and Physical, chart, labs and discussed the procedure including the risks, benefits and alternatives for the proposed anesthesia with the patient or authorized representative who has indicated his/her understanding and acceptance.   Dental advisory given  Plan Discussed with: CRNA  Anesthesia Plan Comments:        Anesthesia Quick  Evaluation

## 2016-02-29 ENCOUNTER — Encounter (HOSPITAL_COMMUNITY): Payer: Self-pay | Admitting: Obstetrics and Gynecology

## 2016-03-12 LAB — CHROMOSOME STD, POC(TISSUE)-NCBH

## 2016-03-15 DIAGNOSIS — N96 Recurrent pregnancy loss: Secondary | ICD-10-CM | POA: Diagnosis not present

## 2016-03-15 LAB — TISSUE HYBRIDIZATION TO NCBH

## 2016-03-17 DIAGNOSIS — N760 Acute vaginitis: Secondary | ICD-10-CM | POA: Diagnosis not present

## 2016-03-17 MED FILL — metroNIDAZOLE 500 MG TABS: 500 | 7 days supply | Qty: 14 | Fill #0

## 2016-03-17 MED FILL — OVIDREL 250 MCG/0.5 ML SYRG: 250 | 28 days supply | Qty: 1 | Fill #0

## 2016-03-17 MED FILL — LETROZOLE 2.5 MG TABLET: 2.5 | 30 days supply | Qty: 15 | Fill #0

## 2016-03-22 DIAGNOSIS — Z319 Encounter for procreative management, unspecified: Secondary | ICD-10-CM | POA: Diagnosis not present

## 2016-03-29 IMAGING — RF DG HYSTEROGRAM
3 series · 3 of 3 positions shown · IV contrast (omnipaque)
Comparison: None.

CLINICAL DATA: History of right ectopic pregnancy. Evaluate patency
of tubes.

EXAM:
HYSTEROSALPINGOGRAM
TECHNIQUE: Following cleansing of the cervix and vagina with Betadine solution,
a hysterosalpingogram was performed using a 5-French
hysterosalpingogram catheter and Omnipaque 300 contrast. The patient
tolerated the examination without difficulty.

[Series 1: run · 1 of 1 slices shown (1 of 3)]
[im 1/1]
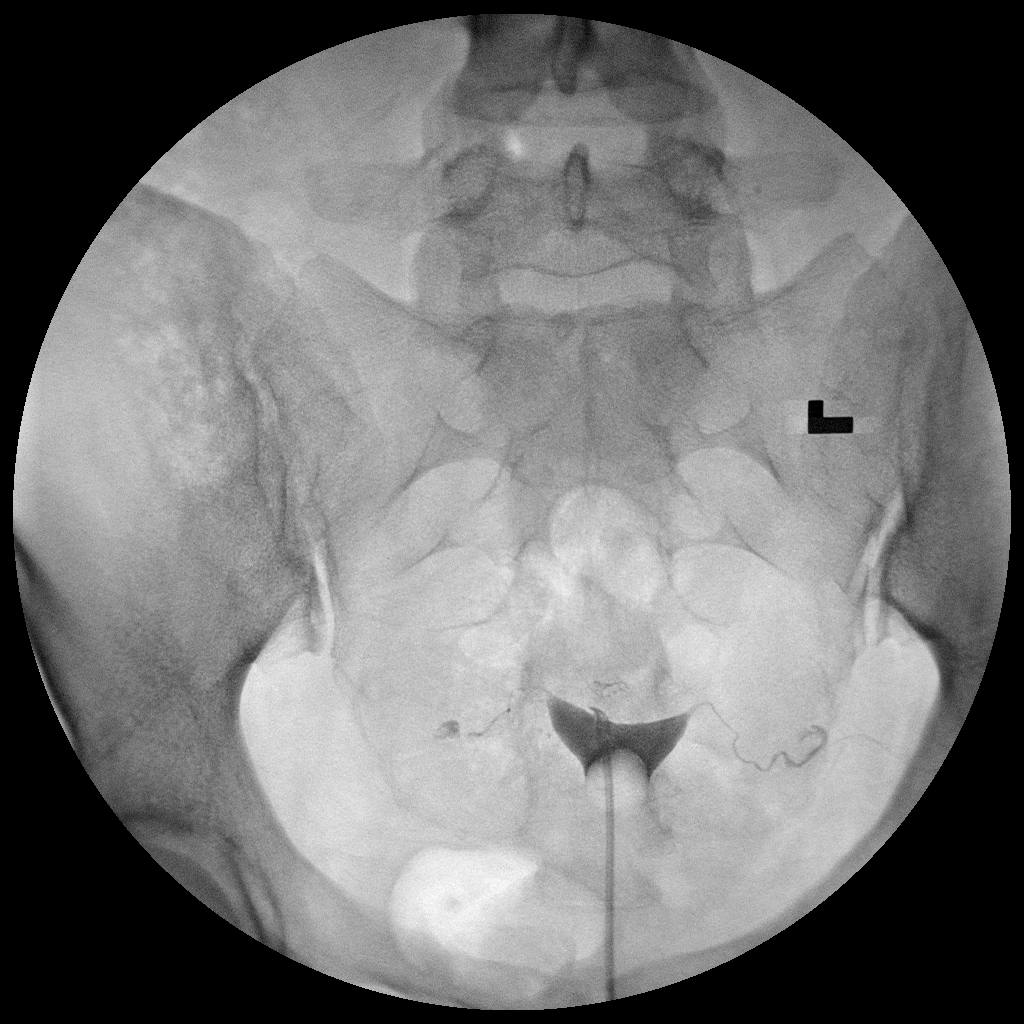

[Series 2: run · 1 of 1 slices shown (2 of 3)]
[im 1/1]
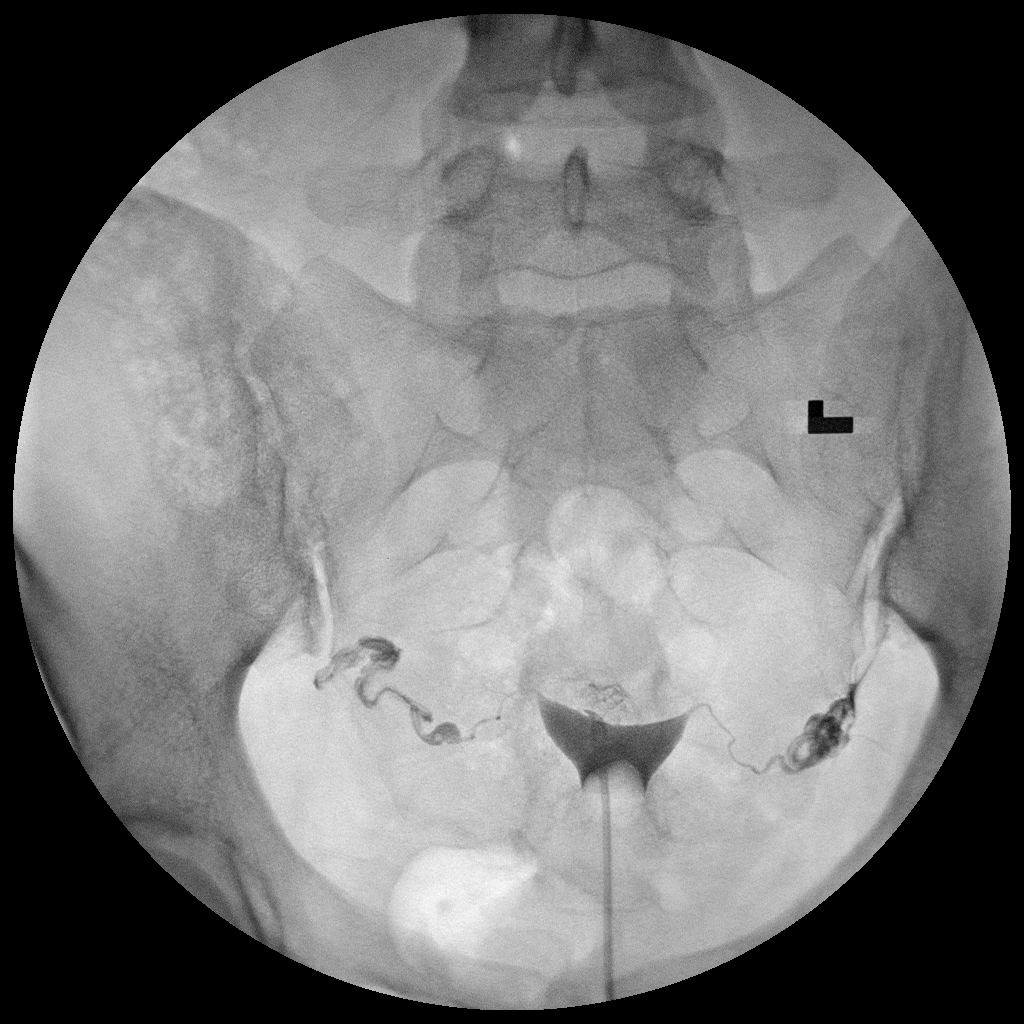

[Series 3: run · 1 of 1 slices shown (3 of 3)]
[im 1/1]
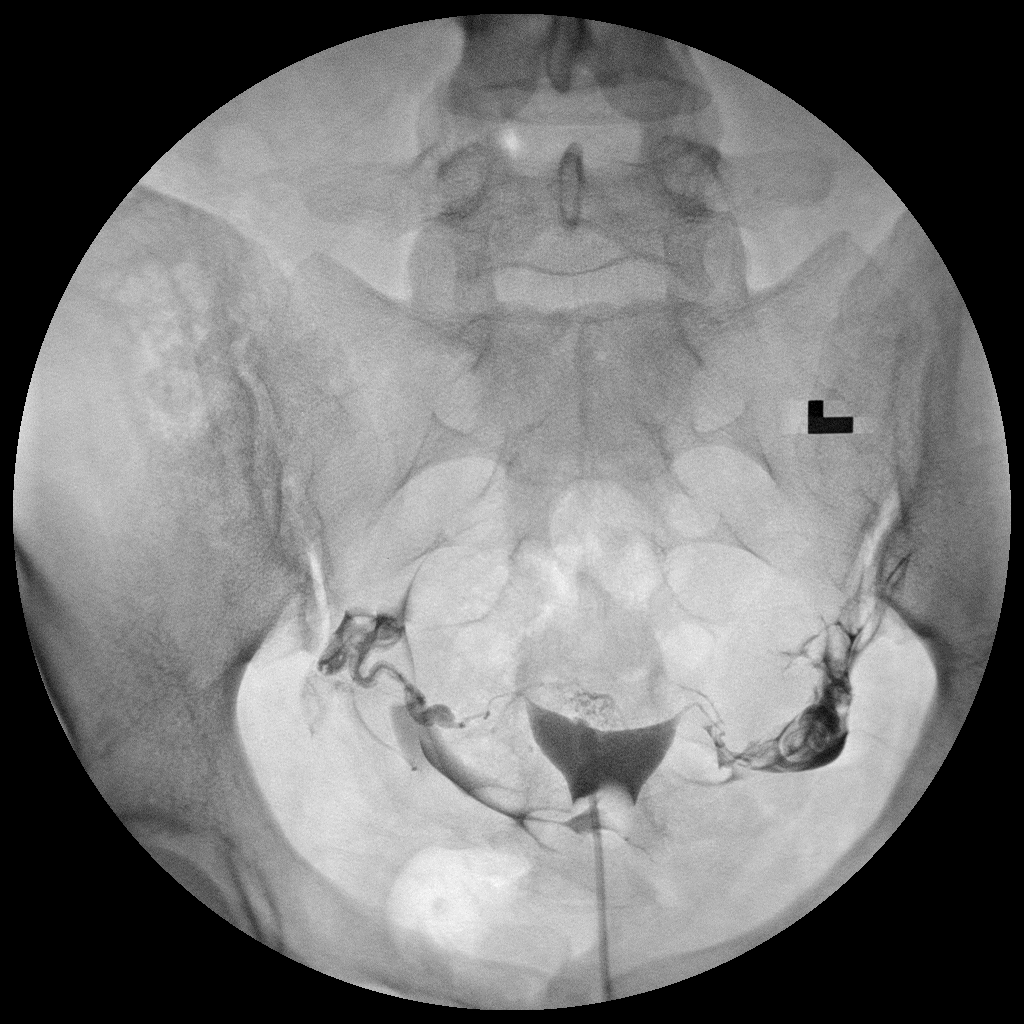

[3 of 3 positions shown; findings below may reference images not displayed]

FLUOROSCOPY TIME:  Radiation Exposure Index (as provided by the
fluoroscopic device):

If the device does not provide the exposure index:

Fluoroscopy Time:  36 seconds

Number of Acquired Images:  3
FINDINGS: Endometrial cavity is normal. Both fallopian tubes fill and have a
normal appearance. Normal spillage bilaterally.
IMPRESSION: Normal study.

## 2016-03-30 MED FILL — FROVATRIPTAN SUCC 2.5 MG TA: 2.5 | 90 days supply | Qty: 27 | Fill #0

## 2016-03-30 MED FILL — FOLIC ACID 1 MG TABLET: 1 | 30 days supply | Qty: 120 | Fill #6

## 2016-04-29 DIAGNOSIS — N760 Acute vaginitis: Secondary | ICD-10-CM | POA: Diagnosis not present

## 2016-04-29 MED FILL — MUPIROCIN 2% OINTMENT: 2 | 7 days supply | Qty: 22 | Fill #0

## 2016-04-29 MED FILL — TINIDAZOLE 500 MG TABLET: 500 | 2 days supply | Qty: 8 | Fill #0

## 2016-05-11 DIAGNOSIS — H5213 Myopia, bilateral: Secondary | ICD-10-CM | POA: Diagnosis not present

## 2016-05-20 DIAGNOSIS — Z01419 Encounter for gynecological examination (general) (routine) without abnormal findings: Secondary | ICD-10-CM | POA: Diagnosis not present

## 2016-05-20 DIAGNOSIS — Z6828 Body mass index (BMI) 28.0-28.9, adult: Secondary | ICD-10-CM | POA: Diagnosis not present

## 2016-06-28 MED FILL — FROVATRIPTAN SUCC 2.5 MG TA: 2.5 | 90 days supply | Qty: 27 | Fill #0

## 2016-06-29 ENCOUNTER — Other Ambulatory Visit: Payer: Self-pay | Admitting: Internal Medicine

## 2016-06-29 ENCOUNTER — Ambulatory Visit
Admission: RE | Admit: 2016-06-29 | Discharge: 2016-06-29 | Disposition: A | Discharge status: Home / Self Care | Payer: 59 | Source: Ambulatory Visit | Attending: Internal Medicine | Admitting: Internal Medicine

## 2016-06-29 DIAGNOSIS — F410 Panic disorder [episodic paroxysmal anxiety] without agoraphobia: Secondary | ICD-10-CM | POA: Diagnosis not present

## 2016-06-29 DIAGNOSIS — F419 Anxiety disorder, unspecified: Secondary | ICD-10-CM | POA: Diagnosis not present

## 2016-06-29 DIAGNOSIS — E039 Hypothyroidism, unspecified: Secondary | ICD-10-CM | POA: Diagnosis not present

## 2016-06-29 DIAGNOSIS — R358 Other polyuria: Secondary | ICD-10-CM | POA: Diagnosis not present

## 2016-06-29 DIAGNOSIS — M25511 Pain in right shoulder: Secondary | ICD-10-CM

## 2016-06-29 DIAGNOSIS — N3010 Interstitial cystitis (chronic) without hematuria: Secondary | ICD-10-CM | POA: Diagnosis not present

## 2016-06-29 MED FILL — LORazepam 0.5 MG TABS: 0.5 | 30 days supply | Qty: 60 | Fill #0

## 2016-07-05 ENCOUNTER — Other Ambulatory Visit: Payer: Self-pay | Admitting: Obstetrics and Gynecology

## 2016-07-13 NOTE — Patient Instructions (Signed)
Your procedure is scheduled on:  Friday, Nov. 10, 2017  Enter through the Main Entrance of [facility] at:  6:00 AM  Pick up the phone at the desk and dial [phone].  Call this number if you have problems the morning of surgery: [phone].  Remember: Do NOT eat food or drink after:  Midnight Thursday, Nov. 9, 2017  Take these medicines the morning of surgery with a SIP OF WATER:  None  Stop ALL herbal medications at this time   Do NOT wear jewelry (body piercing), metal hair clips/bobby pins, make-up, or nail polish. Do NOT wear lotions, powders, or perfumes.  You may wear deodorant. Do NOT shave for 48 hours prior to surgery. Do NOT bring valuables to the hospital. Contacts, dentures, or bridgework may not be worn into surgery.  Have a responsible adult drive you home and stay with you for 24 hours after your procedure

## 2016-07-14 ENCOUNTER — Encounter (HOSPITAL_COMMUNITY): Payer: Self-pay

## 2016-07-14 ENCOUNTER — Encounter (HOSPITAL_COMMUNITY)
Admission: RE | Admit: 2016-07-14 | Discharge: 2016-07-14 | Disposition: A | Discharge status: Home / Self Care | Payer: 59 | Source: Ambulatory Visit | Attending: Obstetrics and Gynecology | Admitting: Obstetrics and Gynecology

## 2016-07-14 DIAGNOSIS — Z01812 Encounter for preprocedural laboratory examination: Secondary | ICD-10-CM | POA: Insufficient documentation

## 2016-07-14 HISTORY — DX: Hypothyroidism, unspecified: E03.9

## 2016-07-14 HISTORY — DX: Cardiac murmur, unspecified: R01.1

## 2016-07-14 LAB — CBC
HEMATOCRIT: 38.9 % (ref 36.0–46.0)
Hemoglobin: 13.8 g/dL (ref 12.0–15.0)
MCH: 30.5 pg (ref 26.0–34.0)
MCHC: 35.5 g/dL (ref 30.0–36.0)
MCV: 86.1 fL (ref 78.0–100.0)
PLATELETS: 317 10*3/uL (ref 150–400)
RBC: 4.52 MIL/uL (ref 3.87–5.11)
RDW: 12.8 % (ref 11.5–15.5)
WBC: 6.3 10*3/uL (ref 4.0–10.5)

## 2016-07-22 NOTE — H&P (Deleted)
  The note originally documented on this encounter has been moved the the encounter in which it belongs.  

## 2016-07-22 NOTE — Anesthesia Preprocedure Evaluation (Signed)
Anesthesia Evaluation  Patient identified by MRN, date of birth, ID band Patient awake    Reviewed: Allergy & Precautions, NPO status , Patient's Chart, lab work & pertinent test results  Airway Mallampati: I  TM Distance: >3 FB Neck ROM: Full    Dental  (+) Teeth Intact   Pulmonary former smoker,    breath sounds clear to auscultation       Cardiovascular negative cardio ROS  + Valvular Problems/Murmurs  Rhythm:Regular Rate:Normal     Neuro/Psych  Headaches, PSYCHIATRIC DISORDERS Anxiety    GI/Hepatic negative GI ROS, Neg liver ROS,   Endo/Other  Hypothyroidism   Renal/GU Renal disease     Musculoskeletal negative musculoskeletal ROS (+)   Abdominal   Peds  Hematology negative hematology ROS (+)   Anesthesia Other Findings   Reproductive/Obstetrics (+) Pregnancy                             Lab Results  Component Value Date   WBC 6.3 07/14/2016   HGB 13.8 07/14/2016   HCT 38.9 07/14/2016   MCV 86.1 07/14/2016   PLT 317 07/14/2016   Lab Results  Component Value Date   CREATININE 0.75 11/14/2014   BUN 13 11/14/2014   NA 135 09/13/2011   K 4.3 09/13/2011   CL 103 09/13/2011   CO2 23 09/13/2011   No results found for: INR, PROTIME   Anesthesia Physical  Anesthesia Plan  ASA: II  Anesthesia Plan: General   Post-op Pain Management:    Induction: Intravenous  Airway Management Planned: Oral ETT  Additional Equipment:   Intra-op Plan:   Post-operative Plan: Extubation in OR  Informed Consent: I have reviewed the patients History and Physical, chart, labs and discussed the procedure including the risks, benefits and alternatives for the proposed anesthesia with the patient or authorized representative who has indicated his/her understanding and acceptance.   Dental advisory given  Plan Discussed with: CRNA  Anesthesia Plan Comments:         Anesthesia  Quick Evaluation

## 2016-07-22 NOTE — H&P (Signed)
NAMEJESSICAANN, [name]                ACCOUNT NO.:  [id number]  MEDICAL RECORD NO.:  [id number]  LOCATION:                                FACILITY:  [facility]  PHYSICIAN:  Lovenia Kim, M.D.DATE OF BIRTH:  06/29/1986  DATE OF ADMISSION:  07/23/2016 DATE OF DISCHARGE:                             HISTORY & PHYSICAL   CHIEF COMPLAINT:  Recurrent pregnancy loss and pelvic pain with dysmenorrhea.  HISTORY OF PRESENT ILLNESS:  A 30 year old white female G4, P0, history of ectopic x1, history of 1st trimester loss x3, who presents for definitive evaluation, history of new-onset disc hernia, dysmenorrhea, and pain suggestive of endometriosis.  MEDICATIONS:  Include folic acid, baby aspirin, prenatal vitamins.  SOCIAL HISTORY:  Nonsmoker, nondrinker.  Denies domestic or physical violence.  SURGICAL HISTORY:  History of D&E x1, history of methotrexate, history of abnormal Pap smear with colposcopy.  FAMILY HISTORY:  Diabetes, brain cancer, throat cancer, lung cancer.  ALLERGIES:  Allergies to MACROBID and PENICILLIN.  PHYSICAL EXAMINATION:  GENERAL:  A well-developed, well-nourished white female, in no acute distress. HEENT:  Normal. NECK:  Supple.  Full range of motion. LUNGS:  Clear. HEART:  Regular rate and rhythm. ABDOMEN:  Soft, nontender. PELVIC:  Reveals uterus to be normal size, shape, and contour.  No adnexal masses.  No nodularity noted.  IMPRESSION: 1. Recurrent pregnancy loss. 2. Dysmenorrhea and menorrhagia. 3. History of ectopic pregnancy.  PLAN:  Proceed with diagnostic laparoscopy, possible da Vinci assisted ablation of endometriosis, possible chromopertubation.  Risks of anesthesia, infection, bleeding, injury to surrounding organs, possible need for repair was discussed, delayed versus immediate complications to include bowel and bladder injury noted.  The patient acknowledges and wishes to proceed.     Lovenia Kim, M.D.     RJT/MEDQ  D:   07/22/2016  T:  07/22/2016  Job:  [id number]

## 2016-07-23 ENCOUNTER — Ambulatory Visit (HOSPITAL_COMMUNITY): Payer: 59 | Admitting: Anesthesiology

## 2016-07-23 ENCOUNTER — Ambulatory Visit (HOSPITAL_COMMUNITY)
Admission: RE | Admit: 2016-07-23 | Discharge: 2016-07-23 | Disposition: A | Discharge status: Home / Self Care | Payer: 59 | Source: Ambulatory Visit | Attending: Obstetrics and Gynecology | Admitting: Obstetrics and Gynecology

## 2016-07-23 ENCOUNTER — Encounter (HOSPITAL_COMMUNITY)
Admission: RE | Disposition: A | Discharge status: Home / Self Care | Payer: Self-pay | Source: Ambulatory Visit | Attending: Obstetrics and Gynecology

## 2016-07-23 ENCOUNTER — Encounter (HOSPITAL_COMMUNITY): Payer: Self-pay | Admitting: *Deleted

## 2016-07-23 DIAGNOSIS — Z880 Allergy status to penicillin: Secondary | ICD-10-CM | POA: Diagnosis not present

## 2016-07-23 DIAGNOSIS — F419 Anxiety disorder, unspecified: Secondary | ICD-10-CM | POA: Diagnosis not present

## 2016-07-23 DIAGNOSIS — Z87891 Personal history of nicotine dependence: Secondary | ICD-10-CM | POA: Diagnosis not present

## 2016-07-23 DIAGNOSIS — N8311 Corpus luteum cyst of right ovary: Secondary | ICD-10-CM | POA: Diagnosis not present

## 2016-07-23 DIAGNOSIS — N941 Unspecified dyspareunia: Secondary | ICD-10-CM | POA: Insufficient documentation

## 2016-07-23 DIAGNOSIS — Z881 Allergy status to other antibiotic agents status: Secondary | ICD-10-CM | POA: Insufficient documentation

## 2016-07-23 DIAGNOSIS — N736 Female pelvic peritoneal adhesions (postinfective): Secondary | ICD-10-CM | POA: Diagnosis not present

## 2016-07-23 DIAGNOSIS — E039 Hypothyroidism, unspecified: Secondary | ICD-10-CM | POA: Insufficient documentation

## 2016-07-23 DIAGNOSIS — N96 Recurrent pregnancy loss: Secondary | ICD-10-CM | POA: Diagnosis not present

## 2016-07-23 DIAGNOSIS — N946 Dysmenorrhea, unspecified: Secondary | ICD-10-CM | POA: Diagnosis not present

## 2016-07-23 DIAGNOSIS — R011 Cardiac murmur, unspecified: Secondary | ICD-10-CM | POA: Insufficient documentation

## 2016-07-23 DIAGNOSIS — R188 Other ascites: Secondary | ICD-10-CM | POA: Diagnosis not present

## 2016-07-23 HISTORY — PX: LAPAROSCOPY: SHX197

## 2016-07-23 HISTORY — PX: CHROMOPERTUBATION: SHX6288

## 2016-07-23 LAB — HCG, SERUM, QUALITATIVE: PREG SERUM: NEGATIVE

## 2016-07-23 SURGERY — LAPAROSCOPY OPERATIVE
Anesthesia: General | Site: Vagina

## 2016-07-23 MED ORDER — ONDANSETRON HCL 4 MG/2ML IJ SOLN
INTRAMUSCULAR | Status: AC
  Filled 2016-07-23: qty 2

## 2016-07-23 MED ORDER — FENTANYL CITRATE (PF) 250 MCG/5ML IJ SOLN
INTRAMUSCULAR | Status: AC
  Filled 2016-07-23: qty 5

## 2016-07-23 MED ORDER — ARTIFICIAL TEARS OP OINT
TOPICAL_OINTMENT | OPHTHALMIC | Status: AC
  Filled 2016-07-23: qty 3.5

## 2016-07-23 MED ORDER — LACTATED RINGERS IV SOLN
INTRAVENOUS | Status: DC
  Administered 2016-07-23 (×2): via INTRAVENOUS

## 2016-07-23 MED ORDER — PHENYLEPHRINE HCL 10 MG/ML IJ SOLN
INTRAMUSCULAR | Status: DC | PRN
  Administered 2016-07-23: 120 ug via INTRAVENOUS
  Administered 2016-07-23: 80 ug via INTRAVENOUS

## 2016-07-23 MED ORDER — DEXAMETHASONE SODIUM PHOSPHATE 10 MG/ML IJ SOLN
INTRAMUSCULAR | Status: DC | PRN
  Administered 2016-07-23: 4 mg via INTRAVENOUS

## 2016-07-23 MED ORDER — LIDOCAINE HCL 2 % EX GEL
1.0000 "application " | Freq: Once | CUTANEOUS | Status: AC
  Administered 2016-07-23: 1 via URETHRAL
  Filled 2016-07-23: qty 5

## 2016-07-23 MED ORDER — ROCURONIUM BROMIDE 100 MG/10ML IV SOLN
INTRAVENOUS | Status: AC
  Filled 2016-07-23: qty 1

## 2016-07-23 MED ORDER — DEXAMETHASONE SODIUM PHOSPHATE 4 MG/ML IJ SOLN
INTRAMUSCULAR | Status: AC
  Filled 2016-07-23: qty 1

## 2016-07-23 MED ORDER — KETOROLAC TROMETHAMINE 30 MG/ML IJ SOLN
INTRAMUSCULAR | Status: AC
Start: 2016-07-23 — End: 2016-07-23
  Filled 2016-07-23: qty 1

## 2016-07-23 MED ORDER — ROCURONIUM BROMIDE 100 MG/10ML IV SOLN
INTRAVENOUS | Status: DC | PRN
  Administered 2016-07-23: 50 mg via INTRAVENOUS

## 2016-07-23 MED ORDER — OXYCODONE-ACETAMINOPHEN 5-325 MG PO TABS: 1.0000 | ORAL_TABLET | ORAL | 0 refills | Status: DC | PRN

## 2016-07-23 MED ORDER — SCOPOLAMINE 1 MG/3DAYS TD PT72
MEDICATED_PATCH | TRANSDERMAL | Status: AC
  Filled 2016-07-23: qty 1

## 2016-07-23 MED ORDER — KETOROLAC TROMETHAMINE 30 MG/ML IJ SOLN
INTRAMUSCULAR | Status: AC
  Filled 2016-07-23: qty 1

## 2016-07-23 MED ORDER — FLUMAZENIL 0.5 MG/5ML IV SOLN
INTRAVENOUS | Status: DC | PRN
  Administered 2016-07-23: 0.2 mg via INTRAVENOUS

## 2016-07-23 MED ORDER — KETOROLAC TROMETHAMINE 30 MG/ML IJ SOLN
INTRAMUSCULAR | Status: DC | PRN
  Administered 2016-07-23: 30 mg via INTRAVENOUS

## 2016-07-23 MED ORDER — LIDOCAINE HCL (CARDIAC) 20 MG/ML IV SOLN
INTRAVENOUS | Status: AC
  Filled 2016-07-23: qty 5

## 2016-07-23 MED ORDER — CEFAZOLIN SODIUM-DEXTROSE 2-4 GM/100ML-% IV SOLN
2.0000 g | INTRAVENOUS | Status: AC
  Administered 2016-07-23: 2 g via INTRAVENOUS

## 2016-07-23 MED ORDER — HYDROMORPHONE HCL 1 MG/ML IJ SOLN
INTRAMUSCULAR | Status: AC
  Filled 2016-07-23: qty 1

## 2016-07-23 MED ORDER — MEPERIDINE HCL 25 MG/ML IJ SOLN: 6.2500 mg | INTRAMUSCULAR | Status: DC | PRN

## 2016-07-23 MED ORDER — LIDOCAINE HCL (CARDIAC) 20 MG/ML IV SOLN
INTRAVENOUS | Status: DC | PRN
  Administered 2016-07-23: 70 mg via INTRAVENOUS
  Administered 2016-07-23: 30 mg via INTRAVENOUS

## 2016-07-23 MED ORDER — PROPOFOL 10 MG/ML IV BOLUS
INTRAVENOUS | Status: AC
  Filled 2016-07-23: qty 20

## 2016-07-23 MED ORDER — OXYCODONE HCL 5 MG PO TABS
ORAL_TABLET | ORAL | Status: AC
  Filled 2016-07-23: qty 1

## 2016-07-23 MED ORDER — SUGAMMADEX SODIUM 500 MG/5ML IV SOLN
INTRAVENOUS | Status: DC | PRN
  Administered 2016-07-23: 300.4 mg via INTRAVENOUS

## 2016-07-23 MED ORDER — BUPIVACAINE HCL (PF) 0.25 % IJ SOLN
INTRAMUSCULAR | Status: DC | PRN
  Administered 2016-07-23: 20 mL

## 2016-07-23 MED ORDER — MIDAZOLAM HCL 2 MG/2ML IJ SOLN
INTRAMUSCULAR | Status: DC | PRN
  Administered 2016-07-23: 1 mg via INTRAVENOUS

## 2016-07-23 MED ORDER — FLUMAZENIL 0.5 MG/5ML IV SOLN
INTRAVENOUS | Status: AC
  Filled 2016-07-23: qty 5

## 2016-07-23 MED ORDER — SCOPOLAMINE 1 MG/3DAYS TD PT72
1.0000 | MEDICATED_PATCH | Freq: Once | TRANSDERMAL | Status: DC
  Administered 2016-07-23: 1.5 mg via TRANSDERMAL

## 2016-07-23 MED ORDER — FENTANYL CITRATE (PF) 100 MCG/2ML IJ SOLN
INTRAMUSCULAR | Status: DC | PRN
  Administered 2016-07-23 (×5): 50 ug via INTRAVENOUS

## 2016-07-23 MED ORDER — SODIUM CHLORIDE 0.9 % IJ SOLN
INTRAMUSCULAR | Status: AC
  Filled 2016-07-23: qty 50

## 2016-07-23 MED ORDER — PROPOFOL 10 MG/ML IV BOLUS
INTRAVENOUS | Status: DC | PRN
  Administered 2016-07-23: 170 mg via INTRAVENOUS
  Administered 2016-07-23: 30 mg via INTRAVENOUS

## 2016-07-23 MED ORDER — FENTANYL CITRATE (PF) 100 MCG/2ML IJ SOLN
INTRAMUSCULAR | Status: AC
  Filled 2016-07-23: qty 2

## 2016-07-23 MED ORDER — KETOROLAC TROMETHAMINE 30 MG/ML IJ SOLN: 30.0000 mg | Freq: Once | INTRAMUSCULAR | Status: DC | PRN

## 2016-07-23 MED ORDER — ONDANSETRON HCL 4 MG/2ML IJ SOLN
INTRAMUSCULAR | Status: DC | PRN
  Administered 2016-07-23: 4 mg via INTRAVENOUS

## 2016-07-23 MED ORDER — PROMETHAZINE HCL 25 MG/ML IJ SOLN: 6.2500 mg | INTRAMUSCULAR | Status: DC | PRN

## 2016-07-23 MED ORDER — HYDROMORPHONE HCL 1 MG/ML IJ SOLN
0.2500 mg | INTRAMUSCULAR | Status: DC | PRN
  Administered 2016-07-23 (×4): 0.5 mg via INTRAVENOUS

## 2016-07-23 MED ORDER — BUPIVACAINE HCL (PF) 0.25 % IJ SOLN
INTRAMUSCULAR | Status: AC
  Filled 2016-07-23: qty 30

## 2016-07-23 MED ORDER — METHYLENE BLUE 0.5 % INJ SOLN
INTRAVENOUS | Status: AC
  Filled 2016-07-23: qty 10

## 2016-07-23 MED ORDER — DIPHENHYDRAMINE HCL 50 MG/ML IJ SOLN
INTRAMUSCULAR | Status: AC
  Filled 2016-07-23: qty 1

## 2016-07-23 MED ORDER — MIDAZOLAM HCL 2 MG/2ML IJ SOLN
INTRAMUSCULAR | Status: AC
  Filled 2016-07-23: qty 2

## 2016-07-23 MED ORDER — ROPIVACAINE HCL 5 MG/ML IJ SOLN
INTRAMUSCULAR | Status: AC
  Filled 2016-07-23: qty 30

## 2016-07-23 MED ORDER — SUGAMMADEX SODIUM 200 MG/2ML IV SOLN
INTRAVENOUS | Status: AC
  Filled 2016-07-23: qty 2

## 2016-07-23 MED ORDER — METHYLENE BLUE 0.5 % INJ SOLN
INTRAVENOUS | Status: DC | PRN
  Administered 2016-07-23: 2 mL

## 2016-07-23 MED ORDER — OXYCODONE HCL 5 MG PO TABS
5.0000 mg | ORAL_TABLET | Freq: Once | ORAL | Status: AC
Start: 2016-07-23 — End: 2016-07-23
  Administered 2016-07-23: 5 mg via ORAL

## 2016-07-23 MED FILL — OXYCODONE W/APAP 5/325 TAB: 5-325 | 3 days supply | Qty: 30 | Fill #0

## 2016-07-23 SURGICAL SUPPLY — 69 items
BAG SPEC RTRVL LRG 6X4 10 (ENDOMECHANICALS)
BARRIER ADHS 3X4 INTERCEED (GAUZE/BANDAGES/DRESSINGS) IMPLANT
BRR ADH 4X3 ABS CNTRL BYND (GAUZE/BANDAGES/DRESSINGS)
CABLE HIGH FREQUENCY MONO STRZ (ELECTRODE) IMPLANT
CATH FOLEY 3WAY  5CC 16FR (CATHETERS) ×1
CATH FOLEY 3WAY 5CC 16FR (CATHETERS) ×3 IMPLANT
CATH ROBINSON RED A/P 16FR (CATHETERS) IMPLANT
CLOTH BEACON ORANGE TIMEOUT ST (SAFETY) ×4 IMPLANT
CONT PATH 16OZ SNAP LID 3702 (MISCELLANEOUS) ×2 IMPLANT
COVER BACK TABLE 60X90IN (DRAPES) ×8 IMPLANT
COVER TIP SHEARS 8 DVNC (MISCELLANEOUS) ×3 IMPLANT
COVER TIP SHEARS 8MM DA VINCI (MISCELLANEOUS) ×1
DECANTER SPIKE VIAL GLASS SM (MISCELLANEOUS) ×12 IMPLANT
DEFOGGER SCOPE WARMER CLEARIFY (MISCELLANEOUS) ×4 IMPLANT
DRSG OPSITE POSTOP 3X4 (GAUZE/BANDAGES/DRESSINGS) ×4 IMPLANT
DURAPREP 26ML APPLICATOR (WOUND CARE) ×4 IMPLANT
ELECT REM PT RETURN 9FT ADLT (ELECTROSURGICAL) ×4
ELECTRODE REM PT RTRN 9FT ADLT (ELECTROSURGICAL) ×3 IMPLANT
FORCEPS CUTTING 33CM 5MM (CUTTING FORCEPS) IMPLANT
FORCEPS CUTTING 45CM 5MM (CUTTING FORCEPS) IMPLANT
GAUZE VASELINE 3X9 (GAUZE/BANDAGES/DRESSINGS) IMPLANT
GLOVE BIO SURGEON STRL SZ7.5 (GLOVE) ×12 IMPLANT
GLOVE BIOGEL PI IND STRL 7.0 (GLOVE) ×6 IMPLANT
GLOVE BIOGEL PI INDICATOR 7.0 (GLOVE) ×2
GOWN STRL REUS W/TWL LRG LVL3 (GOWN DISPOSABLE) ×4 IMPLANT
IV STOPCOCK 4 WAY 40  W/Y SET (IV SOLUTION)
IV STOPCOCK 4 WAY 40 W/Y SET (IV SOLUTION) IMPLANT
KIT ACCESSORY DA VINCI DISP (KITS) ×1
KIT ACCESSORY DVNC DISP (KITS) ×3 IMPLANT
LEGGING LITHOTOMY PAIR STRL (DRAPES) ×4 IMPLANT
LIQUID BAND (GAUZE/BANDAGES/DRESSINGS) ×4 IMPLANT
MANIPULATOR UTERINE 4.5 ZUMI (MISCELLANEOUS) ×2 IMPLANT
NEEDLE INSUFFLATION 120MM (ENDOMECHANICALS) ×2 IMPLANT
NEEDLE INSUFFLATION 150MM (ENDOMECHANICALS) ×4 IMPLANT
PACK LAPAROSCOPY BASIN (CUSTOM PROCEDURE TRAY) ×2 IMPLANT
PACK ROBOT WH (CUSTOM PROCEDURE TRAY) ×4 IMPLANT
PACK ROBOTIC GOWN (GOWN DISPOSABLE) ×4 IMPLANT
PACK TRENDGUARD 450 HYBRID PRO (MISCELLANEOUS) ×1 IMPLANT
PACK TRENDGUARD 600 HYBRD PROC (MISCELLANEOUS) IMPLANT
PAD PREP 24X48 CUFFED NSTRL (MISCELLANEOUS) ×4 IMPLANT
POUCH SPECIMEN RETRIEVAL 10MM (ENDOMECHANICALS) IMPLANT
PROTECTOR NERVE ULNAR (MISCELLANEOUS) ×8 IMPLANT
SET CYSTO W/LG BORE CLAMP LF (SET/KITS/TRAYS/PACK) IMPLANT
SET IRRIG TUBING LAPAROSCOPIC (IRRIGATION / IRRIGATOR) ×4 IMPLANT
SET TRI-LUMEN FLTR TB AIRSEAL (TUBING) ×4 IMPLANT
SOLUTION ELECTROLUBE (MISCELLANEOUS) ×4 IMPLANT
SUT VIC AB 0 CT1 27 (SUTURE) ×8
SUT VIC AB 0 CT1 27XBRD ANBCTR (SUTURE) ×6 IMPLANT
SUT VICRYL 0 UR6 27IN ABS (SUTURE) ×4 IMPLANT
SUT VICRYL 4-0 PS2 18IN ABS (SUTURE) ×8 IMPLANT
SUT VICRYL RAPIDE 4/0 PS 2 (SUTURE) ×8 IMPLANT
SYR 50ML LL SCALE MARK (SYRINGE) ×4 IMPLANT
SYRINGE 10CC LL (SYRINGE) ×4 IMPLANT
TIP UTERINE 5.1X6CM LAV DISP (MISCELLANEOUS) IMPLANT
TIP UTERINE 6.7X10CM GRN DISP (MISCELLANEOUS) IMPLANT
TIP UTERINE 6.7X6CM WHT DISP (MISCELLANEOUS) IMPLANT
TIP UTERINE 6.7X8CM BLUE DISP (MISCELLANEOUS) IMPLANT
TOWEL OR 17X24 6PK STRL BLUE (TOWEL DISPOSABLE) ×12 IMPLANT
TRAY FOLEY CATH SILVER 14FR (SET/KITS/TRAYS/PACK) ×4 IMPLANT
TRENDGUARD 450 HYBRID PRO PACK (MISCELLANEOUS) ×4
TRENDGUARD 600 HYBRID PROC PK (MISCELLANEOUS)
TROCAR DISP BLADELESS 8 DVNC (TROCAR) ×3 IMPLANT
TROCAR DISP BLADELESS 8MM (TROCAR) ×1
TROCAR OPTI TIP 5M 100M (ENDOMECHANICALS) ×2 IMPLANT
TROCAR PORT AIRSEAL 5X120 (TROCAR) ×4 IMPLANT
TROCAR XCEL DIL TIP R 11M (ENDOMECHANICALS) ×2 IMPLANT
TROCAR Z-THREAD 12X150 (TROCAR) ×4 IMPLANT
WARMER LAPAROSCOPE (MISCELLANEOUS) ×2 IMPLANT
WATER STERILE IRR 1000ML POUR (IV SOLUTION) ×4 IMPLANT

## 2016-07-23 NOTE — Anesthesia Postprocedure Evaluation (Signed)
Anesthesia Post Note  Patient: [name]  Procedure(s) Performed: Procedure(s) (LRB): LAPAROSCOPY OPERATIVE, LYSIS OF ADHESIONS (N/A) CHROMOPERTUBATION (Bilateral)  Patient location during evaluation: PACU Anesthesia Type: General Level of consciousness: sedated and patient cooperative Pain management: pain level controlled Vital Signs Assessment: post-procedure vital signs reviewed and stable Respiratory status: spontaneous breathing Cardiovascular status: stable Anesthetic complications: no     Last Vitals:  Vitals:   07/23/16 1030 07/23/16 1045  BP: 98/75 100/61  Pulse: 82 71  Resp: 14 10  Temp:      Last Pain:  Vitals:   07/23/16 1030  TempSrc:   PainSc: 5    Pain Goal: Patients Stated Pain Goal: 3 (07/23/16 0902)               Nolon Nations

## 2016-07-23 NOTE — Anesthesia Procedure Notes (Signed)
Procedure Name: Intubation Date/Time: 07/23/2016 8:01 AM Performed by: Tobin Chad Pre-anesthesia Checklist: Patient identified, Emergency Drugs available, Suction available, Patient being monitored and Timeout performed Patient Re-evaluated:Patient Re-evaluated prior to inductionOxygen Delivery Method: Circle system utilized and Simple face mask Preoxygenation: Pre-oxygenation with 100% oxygen Intubation Type: IV induction Ventilation: Mask ventilation without difficulty Laryngoscope Size: Mac and 3 Grade View: Grade II Tube type: Oral Tube size: 7.0 mm Number of attempts: 1 Airway Equipment and Method: Stylet Placement Confirmation: ETT inserted through vocal cords under direct vision,  positive ETCO2 and breath sounds checked- equal and bilateral Secured at: 22 cm Tube secured with: Tape Dental Injury: Teeth and Oropharynx as per pre-operative assessment

## 2016-07-23 NOTE — Transfer of Care (Deleted)
Immediate Anesthesia Transfer of Care Note  Patient: [name]  Procedure(s) Performed: Procedure(s): LAPAROSCOPY OPERATIVE, LYSIS OF ADHESIONS (N/A) CHROMOPERTUBATION (Bilateral)  Patient Location: PACU  Anesthesia Type:General  Level of Consciousness: awake, alert , oriented and patient cooperative  Airway & Oxygen Therapy: Patient Spontanous Breathing  Post-op Assessment: Report given to RN and Post -op Vital signs reviewed and stable  Post vital signs: Reviewed and stable  Last Vitals:  Vitals:   07/23/16 0612  BP: 115/83  Pulse: 73  Resp: 16  Temp: 36.7 C    Last Pain:  Vitals:   07/23/16 0612  TempSrc: Oral      Patients Stated Pain Goal: 3 (99991111 0000000)  Complications: No apparent anesthesia complications

## 2016-07-23 NOTE — Progress Notes (Signed)
Patient seen and examined. Consent witnessed and signed. No changes noted. Update completed.Patient ID: [name], female   DOB: [birth year], 30 y.o.   MRN: [id number]

## 2016-07-23 NOTE — Op Note (Signed)
07/23/2016  8:49 AM  PATIENT:  Negley  30 y.o. female  PRE-OPERATIVE DIAGNOSIS:  Dyspareunia, Dysmenorrhea  POST-OPERATIVE DIAGNOSIS:  Dyspareunia, Dysmenorrhea, pelvic adhesions  PROCEDURE:  Procedure(s): LAPAROSCOPY OPERATIVE LYSIS OF ADHESIONS CHROMOPERTUBATION  SURGEON:  Surgeon(s): Brien Few, MD  ASSISTANTS: paul, cnm   ANESTHESIA:   local and general  ESTIMATED BLOOD LOSS: minimal  DRAINS: none   LOCAL MEDICATIONS USED:  MARCAINE    and Amount: 20 ml  SPECIMEN:  No Specimen  DISPOSITION OF SPECIMEN:  N/A  COUNTS:  YES  DICTATION #: [id number]  PLAN OF CARE: dc home  PATIENT DISPOSITION:  PACU - hemodynamically stable.

## 2016-07-23 NOTE — Discharge Instructions (Signed)
DISCHARGE INSTRUCTIONS: Laparoscopy  The following instructions have been prepared to help you care for yourself upon your return home today.  Wound care:  Do not get the incision wet for the first 24 hours. The incision should be kept clean and dry.  The Band-Aids or dressings may be removed the day after surgery.  Should the incision become sore, red, and swollen after the first week, check with your doctor.  Personal hygiene:  Shower the day after your procedure.  Activity and limitations:  Do NOT drive or operate any equipment today.  Do NOT lift anything more than 15 pounds for 2-3 weeks after surgery.  Do NOT rest in bed all day.  Walking is encouraged. Walk each day, starting slowly with 5-minute walks 3 or 4 times a day. Slowly increase the length of your walks.  Walk up and down stairs slowly.  Do NOT do strenuous activities, such as golfing, playing tennis, bowling, running, biking, weight lifting, gardening, mowing, or vacuuming for 2-4 weeks. Ask your doctor when it is okay to start.  Diet: Eat a light meal as desired this evening. You may resume your usual diet tomorrow.  Return to work: This is dependent on the type of work you do. For the most part you can return to a desk job within a week of surgery. If you are more active at work, please discuss this with your doctor.  What to expect after your surgery: You may have a slight burning sensation when you urinate on the first day. You may have a very small amount of blood in the urine. Expect to have a small amount of vaginal discharge/light bleeding for 1-2 weeks. It is not unusual to have abdominal soreness and bruising for up to 2 weeks. You may be tired and need more rest for about 1 week. You may experience shoulder pain for 24-72 hours. Lying flat in bed may relieve it.  Do not take ibuprofen/Motrin/Advil products before 2:45pm 07/23/16.  You may remove the patch behind your ear on or before Monday 07/26/16.  Wash your hands with soap and water after any contact with the patch.  Avoid touching your eyes.  Call your doctor for any of the following:  Develop a fever of 100.4 or greater  Inability to urinate 6 hours after discharge from hospital  Severe pain not relieved by pain medications  Persistent of heavy bleeding at incision site  Redness or swelling around incision site after a week  Increasing nausea or vomiting  Patient Signature________________________________________ Nurse Signature_________________________________________

## 2016-07-23 NOTE — Transfer of Care (Signed)
Immediate Anesthesia Transfer of Care Note  Patient: [name]  Procedure(s) Performed: Procedure(s): LAPAROSCOPY OPERATIVE, LYSIS OF ADHESIONS (N/A) CHROMOPERTUBATION (Bilateral)  Patient Location: PACU  Anesthesia Type:General  Level of Consciousness: awake, alert , oriented and patient cooperative  Airway & Oxygen Therapy: Patient Spontanous Breathing  Post-op Assessment: Report given to RN and Post -op Vital signs reviewed and stable  Post vital signs: Reviewed and stable  Last Vitals:  Vitals:   07/23/16 0612 07/23/16 0902  BP: 115/83 106/80  Pulse: 73 93  Resp: 16 (!) 22  Temp: 36.7 C 36.8 C    Last Pain:  Vitals:   07/23/16 0612  TempSrc: Oral      Patients Stated Pain Goal: 3 (99991111 0000000)  Complications: No apparent anesthesia complications

## 2016-07-24 NOTE — Op Note (Signed)
NAMEJEFFRIE, ARONICA                ACCOUNT NO.:  [id number]  MEDICAL RECORD NO.:  [id number]  LOCATION:  [facility]                          FACILITY:  [facility]  PHYSICIAN:  Lovenia Kim, M.D.DATE OF BIRTH:  01/29/1986  DATE OF PROCEDURE: DATE OF DISCHARGE:                              OPERATIVE REPORT   PREOPERATIVE DIAGNOSES:  Dyspareunia, dysmenorrhea, history of ectopic pregnancy, recurrent pregnancy loss.  POSTOPERATIVE DIAGNOSIS:  Pelvic adhesions.  PROCEDURE:  Operative laparoscopy with lysis of adhesions, chromopertubation.  SURGEON:  Lovenia Kim, MD.  ASSISTANTEddie Dibbles, CNM.  ESTIMATED BLOOD LOSS:  Less than 50 mL.  COMPLICATIONS:  None.  DRAINS:  Foley.  COUNTS:  Correct.  DISPOSITION:  The patient to recovery in good condition.  BRIEF OPERATIVE NOTE:  After being apprised of risks of anesthesia, infection, bleeding, injury to surrounding organs, possible need for repair, delayed versus immediate complications to include bowel and bladder injury, possible need for repair, inability to prevent pelvic pain.  The patient was brought to the operating room, where she was administered general anesthetic without complications.  Prepped and draped in usual sterile fashion.  Foley catheter placed.  Exam under anesthesia revealed an anteflexed uterus and no adnexal masses.  A ZUMI retractor was placed vaginally without difficulty and infraumbilical incision made with a scalpel.  Veress needle placed.  Opening pressure - 1, 3 L CO2 insufflated without difficulty.  Visualization reveals some string like adhesions from the sigmoid colon to the posterior wall of the uterus.  A right corpus luteum cyst, normal tubes, normal anterior and posterior cul-de-sac.  There are very small focus of some perihepatic adhesions at the superior pole of liver, otherwise normal liver, gallbladder area, normal appendiceal area.  At this time, second operative ports placed on the left.   AirSeal was established and sharp dissection of pelvic adhesions done without difficulty. Chromopertubation done without difficulty with efflux easily from the left tube and slowly from the right.  At this time, seeing no other intraoperative pathology except for small defect at the area where the round ligament exits the pelvis with no evidence of symptomatology or bowel herniation.  Instruments removed under direct visualization.  CO2 was released.  Incisions were closed using 0 Vicryl, 4-0 Vicryl, Dermabond placed, Marcaine placed, ZUMI retractor removed, urine was clear.  The patient tolerated the procedure well, was awakened, and transferred to recovery in good condition.     Lovenia Kim, M.D.     RJT/MEDQ  D:  07/23/2016  T:  07/23/2016  Job:  [id number]

## 2016-07-25 ENCOUNTER — Encounter (HOSPITAL_COMMUNITY): Payer: Self-pay | Admitting: Obstetrics and Gynecology

## 2016-09-28 DIAGNOSIS — N3941 Urge incontinence: Secondary | ICD-10-CM | POA: Diagnosis not present

## 2016-09-28 DIAGNOSIS — R3915 Urgency of urination: Secondary | ICD-10-CM | POA: Diagnosis not present

## 2016-09-28 DIAGNOSIS — N3010 Interstitial cystitis (chronic) without hematuria: Secondary | ICD-10-CM | POA: Diagnosis not present

## 2016-09-28 DIAGNOSIS — R350 Frequency of micturition: Secondary | ICD-10-CM | POA: Diagnosis not present

## 2016-10-26 DIAGNOSIS — Z8759 Personal history of other complications of pregnancy, childbirth and the puerperium: Secondary | ICD-10-CM | POA: Diagnosis not present

## 2016-10-26 DIAGNOSIS — Z3201 Encounter for pregnancy test, result positive: Secondary | ICD-10-CM | POA: Diagnosis not present

## 2016-10-28 DIAGNOSIS — Z8759 Personal history of other complications of pregnancy, childbirth and the puerperium: Secondary | ICD-10-CM | POA: Diagnosis not present

## 2016-11-03 DIAGNOSIS — Z8759 Personal history of other complications of pregnancy, childbirth and the puerperium: Secondary | ICD-10-CM | POA: Diagnosis not present

## 2016-11-05 DIAGNOSIS — Z8759 Personal history of other complications of pregnancy, childbirth and the puerperium: Secondary | ICD-10-CM | POA: Diagnosis not present

## 2016-11-10 MED FILL — FOLIC ACID 1 MG TABLET: 1 | 30 days supply | Qty: 120 | Fill #0

## 2016-11-16 DIAGNOSIS — Z3201 Encounter for pregnancy test, result positive: Secondary | ICD-10-CM | POA: Diagnosis not present

## 2016-11-19 MED FILL — BUTALBITAL/APAP/CAFFEINE TB: 50-325-40 | 3 days supply | Qty: 30 | Fill #0

## 2016-11-30 DIAGNOSIS — Z3A01 Less than 8 weeks gestation of pregnancy: Secondary | ICD-10-CM | POA: Diagnosis not present

## 2016-11-30 DIAGNOSIS — O0911 Supervision of pregnancy with history of ectopic or molar pregnancy, first trimester: Secondary | ICD-10-CM | POA: Diagnosis not present

## 2016-11-30 DIAGNOSIS — O2620 Pregnancy care for patient with recurrent pregnancy loss, unspecified trimester: Secondary | ICD-10-CM | POA: Diagnosis not present

## 2016-11-30 DIAGNOSIS — Z3481 Encounter for supervision of other normal pregnancy, first trimester: Secondary | ICD-10-CM | POA: Diagnosis not present

## 2016-12-02 MED FILL — HYDROCODON-APAP 5-325: 5-325 | 3 days supply | Qty: 30 | Fill #0

## 2016-12-02 MED FILL — PROMETHAZINE 25 MG TABLET: 25 | 3 days supply | Qty: 24 | Fill #0

## 2016-12-07 MED FILL — FOLIC ACID 1 MG TABLET: 1 | 30 days supply | Qty: 120 | Fill #1

## 2016-12-10 MED FILL — BUTALBITAL/APAP/CAFFEINE TB: 50-325-40 | 5 days supply | Qty: 30 | Fill #0

## 2016-12-14 DIAGNOSIS — O0911 Supervision of pregnancy with history of ectopic or molar pregnancy, first trimester: Secondary | ICD-10-CM | POA: Diagnosis not present

## 2016-12-14 DIAGNOSIS — Z3491 Encounter for supervision of normal pregnancy, unspecified, first trimester: Secondary | ICD-10-CM | POA: Diagnosis not present

## 2016-12-14 DIAGNOSIS — Z36 Encounter for antenatal screening for chromosomal anomalies: Secondary | ICD-10-CM | POA: Diagnosis not present

## 2016-12-14 DIAGNOSIS — Z3682 Encounter for antenatal screening for nuchal translucency: Secondary | ICD-10-CM | POA: Diagnosis not present

## 2016-12-14 DIAGNOSIS — O2620 Pregnancy care for patient with recurrent pregnancy loss, unspecified trimester: Secondary | ICD-10-CM | POA: Diagnosis not present

## 2016-12-14 DIAGNOSIS — Z3A10 10 weeks gestation of pregnancy: Secondary | ICD-10-CM | POA: Diagnosis not present

## 2016-12-14 MED FILL — TARON-C DHA CAPSULE: 53.5-38-1 | 90 days supply | Qty: 90 | Fill #0

## 2016-12-27 DIAGNOSIS — O0911 Supervision of pregnancy with history of ectopic or molar pregnancy, first trimester: Secondary | ICD-10-CM | POA: Diagnosis not present

## 2016-12-27 DIAGNOSIS — R1011 Right upper quadrant pain: Secondary | ICD-10-CM | POA: Diagnosis not present

## 2016-12-27 DIAGNOSIS — Z3682 Encounter for antenatal screening for nuchal translucency: Secondary | ICD-10-CM | POA: Diagnosis not present

## 2016-12-27 DIAGNOSIS — O2620 Pregnancy care for patient with recurrent pregnancy loss, unspecified trimester: Secondary | ICD-10-CM | POA: Diagnosis not present

## 2016-12-27 DIAGNOSIS — Z3A12 12 weeks gestation of pregnancy: Secondary | ICD-10-CM | POA: Diagnosis not present

## 2016-12-27 MED FILL — ONDANSETRON ODT 4 MG TABLET: 4 | 8 days supply | Qty: 24 | Fill #0

## 2017-01-11 MED FILL — BUTALBITAL/APAP/CAFFEINE TB: 50-325-40 | 5 days supply | Qty: 30 | Fill #0

## 2017-01-12 MED FILL — FOLIC ACID 1 MG TABLET: 1 | 30 days supply | Qty: 120 | Fill #2

## 2017-01-25 DIAGNOSIS — Z3A16 16 weeks gestation of pregnancy: Secondary | ICD-10-CM | POA: Diagnosis not present

## 2017-01-25 DIAGNOSIS — O0912 Supervision of pregnancy with history of ectopic or molar pregnancy, second trimester: Secondary | ICD-10-CM | POA: Diagnosis not present

## 2017-01-25 DIAGNOSIS — Z361 Encounter for antenatal screening for raised alphafetoprotein level: Secondary | ICD-10-CM | POA: Diagnosis not present

## 2017-01-25 MED FILL — ONDANSETRON ODT 4 MG TABLET: 4 | 8 days supply | Qty: 24 | Fill #1

## 2017-02-09 DIAGNOSIS — Z3A18 18 weeks gestation of pregnancy: Secondary | ICD-10-CM | POA: Diagnosis not present

## 2017-02-09 DIAGNOSIS — O358XX0 Maternal care for other (suspected) fetal abnormality and damage, not applicable or unspecified: Secondary | ICD-10-CM | POA: Diagnosis not present

## 2017-02-15 MED FILL — BUTALBITAL/APAP/CAFFEINE TB: 50-325-40 | 5 days supply | Qty: 30 | Fill #0

## 2017-02-15 MED FILL — FOLIC ACID 1 MG TABLET: 1 | 30 days supply | Qty: 120 | Fill #3

## 2017-02-25 DIAGNOSIS — O358XX0 Maternal care for other (suspected) fetal abnormality and damage, not applicable or unspecified: Secondary | ICD-10-CM | POA: Diagnosis not present

## 2017-02-25 DIAGNOSIS — Z3A21 21 weeks gestation of pregnancy: Secondary | ICD-10-CM | POA: Diagnosis not present

## 2017-02-25 MED FILL — busPIRone HCL 5 MG TABS: 5 | 30 days supply | Qty: 60 | Fill #0

## 2017-03-24 MED FILL — FOLIC ACID 1 MG TABLET: 1 | 30 days supply | Qty: 120 | Fill #4

## 2017-03-24 MED FILL — TARON-C DHA CAPSULE: 53.5-38-1 | 90 days supply | Qty: 90 | Fill #1

## 2017-03-24 MED FILL — busPIRone HCL 5 MG TABS: 5 | 30 days supply | Qty: 60 | Fill #1

## 2017-04-12 DIAGNOSIS — O358XX0 Maternal care for other (suspected) fetal abnormality and damage, not applicable or unspecified: Secondary | ICD-10-CM | POA: Diagnosis not present

## 2017-04-12 DIAGNOSIS — Z23 Encounter for immunization: Secondary | ICD-10-CM | POA: Diagnosis not present

## 2017-04-12 DIAGNOSIS — Z3A27 27 weeks gestation of pregnancy: Secondary | ICD-10-CM | POA: Diagnosis not present

## 2017-04-12 DIAGNOSIS — Z3689 Encounter for other specified antenatal screening: Secondary | ICD-10-CM | POA: Diagnosis not present

## 2017-04-26 DIAGNOSIS — Z3689 Encounter for other specified antenatal screening: Secondary | ICD-10-CM | POA: Diagnosis not present

## 2017-04-27 MED FILL — busPIRone HCL 5 MG TABS: 5 | 30 days supply | Qty: 60 | Fill #2

## 2017-04-27 MED FILL — BUTALB-ACETAMIN-CAFF 50-325: 50-325-40 | 5 days supply | Qty: 30 | Fill #0

## 2017-05-19 DIAGNOSIS — H5213 Myopia, bilateral: Secondary | ICD-10-CM | POA: Diagnosis not present

## 2017-05-27 MED FILL — HYDROCODON-APAP 5-325: 5-325 | 2 days supply | Qty: 10 | Fill #0

## 2017-06-06 DIAGNOSIS — Z3685 Encounter for antenatal screening for Streptococcus B: Secondary | ICD-10-CM | POA: Diagnosis not present

## 2017-06-15 DIAGNOSIS — Z3A36 36 weeks gestation of pregnancy: Secondary | ICD-10-CM | POA: Diagnosis not present

## 2017-06-15 DIAGNOSIS — O3663X0 Maternal care for excessive fetal growth, third trimester, not applicable or unspecified: Secondary | ICD-10-CM | POA: Diagnosis not present

## 2017-06-29 MED FILL — BUTALB-ACETAMIN-CAFF 50-325: 50-325-40 | 5 days supply | Qty: 30 | Fill #0

## 2017-07-04 ENCOUNTER — Inpatient Hospital Stay (HOSPITAL_COMMUNITY)
Admission: AD | Admit: 2017-07-04 | Discharge: 2017-07-07 | DRG: 787 | Disposition: A | Discharge status: Home / Self Care | Payer: 59 | Source: Ambulatory Visit | Attending: Obstetrics and Gynecology | Admitting: Obstetrics and Gynecology

## 2017-07-04 ENCOUNTER — Encounter (HOSPITAL_COMMUNITY): Payer: Self-pay | Admitting: *Deleted

## 2017-07-04 DIAGNOSIS — O134 Gestational [pregnancy-induced] hypertension without significant proteinuria, complicating childbirth: Secondary | ICD-10-CM | POA: Diagnosis present

## 2017-07-04 DIAGNOSIS — O133 Gestational [pregnancy-induced] hypertension without significant proteinuria, third trimester: Secondary | ICD-10-CM | POA: Diagnosis not present

## 2017-07-04 DIAGNOSIS — O3663X0 Maternal care for excessive fetal growth, third trimester, not applicable or unspecified: Secondary | ICD-10-CM | POA: Diagnosis not present

## 2017-07-04 DIAGNOSIS — O99214 Obesity complicating childbirth: Secondary | ICD-10-CM | POA: Diagnosis present

## 2017-07-04 DIAGNOSIS — O1205 Gestational edema, complicating the puerperium: Secondary | ICD-10-CM | POA: Diagnosis present

## 2017-07-04 DIAGNOSIS — Z3A39 39 weeks gestation of pregnancy: Secondary | ICD-10-CM | POA: Diagnosis not present

## 2017-07-04 DIAGNOSIS — Z880 Allergy status to penicillin: Secondary | ICD-10-CM | POA: Diagnosis not present

## 2017-07-04 DIAGNOSIS — O9081 Anemia of the puerperium: Secondary | ICD-10-CM | POA: Diagnosis not present

## 2017-07-04 DIAGNOSIS — L231 Allergic contact dermatitis due to adhesives: Secondary | ICD-10-CM | POA: Diagnosis not present

## 2017-07-04 DIAGNOSIS — O2622 Pregnancy care for patient with recurrent pregnancy loss, second trimester: Secondary | ICD-10-CM | POA: Diagnosis present

## 2017-07-04 DIAGNOSIS — D62 Acute posthemorrhagic anemia: Secondary | ICD-10-CM | POA: Diagnosis not present

## 2017-07-04 HISTORY — DX: Vomiting, unspecified: R11.10

## 2017-07-04 HISTORY — DX: Mental disorder, not otherwise specified: F99

## 2017-07-04 HISTORY — DX: Gastro-esophageal reflux disease without esophagitis: K21.9

## 2017-07-04 HISTORY — DX: Low grade squamous intraepithelial lesion on cytologic smear of cervix (LGSIL): R87.612

## 2017-07-04 HISTORY — DX: Other specified bacterial agents as the cause of diseases classified elsewhere: B96.89

## 2017-07-04 HISTORY — DX: Interstitial cystitis (chronic) without hematuria: N30.10

## 2017-07-04 HISTORY — DX: Other specified bacterial agents as the cause of diseases classified elsewhere: N76.0

## 2017-07-04 LAB — CBC
HEMATOCRIT: 36 % (ref 36.0–46.0)
HEMOGLOBIN: 12.8 g/dL (ref 12.0–15.0)
MCH: 30.7 pg (ref 26.0–34.0)
MCHC: 35.6 g/dL (ref 30.0–36.0)
MCV: 86.3 fL (ref 78.0–100.0)
Platelets: 231 10*3/uL (ref 150–400)
RBC: 4.17 MIL/uL (ref 3.87–5.11)
RDW: 13.8 % (ref 11.5–15.5)
WBC: 11.1 10*3/uL — AB (ref 4.0–10.5)

## 2017-07-04 LAB — TYPE AND SCREEN
ABO/RH(D): O POS
ANTIBODY SCREEN: NEGATIVE

## 2017-07-04 LAB — COMPREHENSIVE METABOLIC PANEL
ALT: 13 U/L — ABNORMAL LOW (ref 14–54)
ANION GAP: 9 (ref 5–15)
AST: 23 U/L (ref 15–41)
Albumin: 2.9 g/dL — ABNORMAL LOW (ref 3.5–5.0)
Alkaline Phosphatase: 136 U/L — ABNORMAL HIGH (ref 38–126)
BILIRUBIN TOTAL: 0.3 mg/dL (ref 0.3–1.2)
BUN: 10 mg/dL (ref 6–20)
CO2: 19 mmol/L — ABNORMAL LOW (ref 22–32)
Calcium: 8.7 mg/dL — ABNORMAL LOW (ref 8.9–10.3)
Chloride: 107 mmol/L (ref 101–111)
Creatinine, Ser: 0.61 mg/dL (ref 0.44–1.00)
Glucose, Bld: 121 mg/dL — ABNORMAL HIGH (ref 65–99)
POTASSIUM: 4 mmol/L (ref 3.5–5.1)
Sodium: 135 mmol/L (ref 135–145)
TOTAL PROTEIN: 5.8 g/dL — AB (ref 6.5–8.1)

## 2017-07-04 LAB — OB RESULTS CONSOLE RUBELLA ANTIBODY, IGM: RUBELLA: IMMUNE

## 2017-07-04 LAB — OB RESULTS CONSOLE RPR: RPR: NONREACTIVE

## 2017-07-04 LAB — OB RESULTS CONSOLE GC/CHLAMYDIA
CHLAMYDIA, DNA PROBE: NEGATIVE
GC PROBE AMP, GENITAL: NEGATIVE

## 2017-07-04 LAB — OB RESULTS CONSOLE GBS: GBS: NEGATIVE

## 2017-07-04 LAB — OB RESULTS CONSOLE HIV ANTIBODY (ROUTINE TESTING): HIV: NONREACTIVE

## 2017-07-04 LAB — OB RESULTS CONSOLE HEPATITIS B SURFACE ANTIGEN: HEP B S AG: NEGATIVE

## 2017-07-04 MED ORDER — OXYCODONE-ACETAMINOPHEN 5-325 MG PO TABS: 2.0000 | ORAL_TABLET | ORAL | Status: DC | PRN

## 2017-07-04 MED ORDER — HYDROXYZINE HCL 50 MG/ML IM SOLN
25.0000 mg | Freq: Once | INTRAMUSCULAR | Status: AC
  Administered 2017-07-04: 25 mg via INTRAMUSCULAR
  Filled 2017-07-04: qty 0.5

## 2017-07-04 MED ORDER — MISOPROSTOL 25 MCG QUARTER TABLET
25.0000 ug | ORAL_TABLET | ORAL | Status: DC | PRN
  Administered 2017-07-04 (×2): 25 ug via VAGINAL
  Filled 2017-07-04 (×2): qty 1

## 2017-07-04 MED ORDER — ONDANSETRON HCL 4 MG/2ML IJ SOLN: 4.0000 mg | Freq: Four times a day (QID) | INTRAMUSCULAR | Status: DC | PRN

## 2017-07-04 MED ORDER — TERBUTALINE SULFATE 1 MG/ML IJ SOLN
0.2500 mg | Freq: Once | INTRAMUSCULAR | Status: DC | PRN
  Filled 2017-07-04: qty 1

## 2017-07-04 MED ORDER — SOD CITRATE-CITRIC ACID 500-334 MG/5ML PO SOLN
30.0000 mL | ORAL | Status: DC | PRN
Start: 2017-07-04 — End: 2017-07-05
  Administered 2017-07-05: 30 mL via ORAL
  Filled 2017-07-04: qty 15

## 2017-07-04 MED ORDER — LIDOCAINE HCL (PF) 1 % IJ SOLN: 30.0000 mL | INTRAMUSCULAR | Status: DC | PRN

## 2017-07-04 MED ORDER — FENTANYL CITRATE (PF) 100 MCG/2ML IJ SOLN
50.0000 ug | Freq: Once | INTRAMUSCULAR | Status: AC
  Administered 2017-07-04: 50 ug via INTRAVENOUS
  Filled 2017-07-04: qty 2

## 2017-07-04 MED ORDER — OXYTOCIN BOLUS FROM INFUSION: 500.0000 mL | Freq: Once | INTRAVENOUS | Status: DC

## 2017-07-04 MED ORDER — LACTATED RINGERS IV SOLN
INTRAVENOUS | Status: DC
  Administered 2017-07-04: 18:00:00 via INTRAVENOUS

## 2017-07-04 MED ORDER — ZOLPIDEM TARTRATE 5 MG PO TABS: 5.0000 mg | ORAL_TABLET | Freq: Every evening | ORAL | Status: DC | PRN

## 2017-07-04 MED ORDER — LACTATED RINGERS IV SOLN
500.0000 mL | INTRAVENOUS | Status: DC | PRN
  Administered 2017-07-05 (×3): 500 mL via INTRAVENOUS

## 2017-07-04 MED ORDER — OXYCODONE-ACETAMINOPHEN 5-325 MG PO TABS: 1.0000 | ORAL_TABLET | ORAL | Status: DC | PRN

## 2017-07-04 MED ORDER — ACETAMINOPHEN 325 MG PO TABS: 650.0000 mg | ORAL_TABLET | ORAL | Status: DC | PRN

## 2017-07-04 MED ORDER — OXYTOCIN 40 UNITS IN LACTATED RINGERS INFUSION - SIMPLE MED: 2.5000 [IU]/h | INTRAVENOUS | Status: DC

## 2017-07-04 NOTE — Anesthesia Pain Management Evaluation Note (Signed)
  CRNA Pain Management Visit Note  Patient: [name], 31 y.o., female  "Hello I am a member of the anesthesia team at [facility]. We have an anesthesia team available at all times to provide care throughout the hospital, including epidural management and anesthesia for C-section. I don't know your plan for the delivery whether it a natural birth, water birth, IV sedation, nitrous supplementation, doula or epidural, but we want to meet your pain goals."   1.Was your pain managed to your expectations on prior hospitalizations?   No prior hospitalizations  2.What is your expectation for pain management during this hospitalization?     Epidural  3.How can we help you reach that goal? Epidural when ready. Patient is aware of all pain control options.  Record the patient's initial score and the patient's pain goal.   Pain: 0 for contraction/labor pain, but patient does report back pain of 6-7/10  Pain Goal: 3 [facility] wants you to be able to say your pain was always managed very well.  Alle Difabio L 07/04/2017

## 2017-07-04 NOTE — H&P (Signed)
[name] is a 31 y.o. female presenting for IOL for htn and new onset headache.. OB History    Gravida Para Term Preterm AB Living   5 0 0 0 4 0   SAB TAB Ectopic Multiple Live Births   3 0 1 0 0     Past Medical History:  Diagnosis Date  . Anxiety   . Asthma    CHILDHOOD  . Bacterial vaginitis   . Chronic kidney disease    INTERTERSIAL CYSTITIS  . Frequency of urination   . GERD (gastroesophageal reflux disease)   . Headache    MIGRAINES   . Heart murmur   . Hyperemesis   . Hypothyroidism    no longer needs medication  . Interstitial cystitis   . LGSIL on Pap smear of cervix   . Mental disorder   . Nocturia   . Pelvic pain in female   . Spotting between menses   . Urgency of urination    Past Surgical History:  Procedure Laterality Date  . CHROMOPERTUBATION Bilateral 07/23/2016   Procedure: CHROMOPERTUBATION;  Surgeon: Brien Few, MD;  Location: [facility];  Service: Gynecology;  Laterality: Bilateral;  . COLPOSCOPY    . CYSTO WITH HYDRODISTENSION  10/14/2011   Procedure: CYSTOSCOPY/HYDRODISTENSION;  Surgeon: Malka So, MD;  Location: [facility];  Service: Urology;  Laterality: N/A;  [place] AND CURETTAGE OF UTERUS  09/16/2011   Procedure: DILATATION AND CURETTAGE;  Surgeon: Gus Height;  Location: [facility];  Service: Gynecology;  Laterality: N/A;  Endometrial Biopsy, Urethral Dilation  . DILATION AND EVACUATION N/A 02/26/2016   Procedure: DILATATION AND EVACUATION/Chromosome Analysis;  Surgeon: Brien Few, MD;  Location: [facility];  Service: Gynecology;  Laterality: N/A;  with chromosome studies   . LAPAROSCOPIC LYSIS OF ADHESIONS    . LAPAROSCOPY  09/16/2011   Procedure: LAPAROSCOPY OPERATIVE;  Surgeon: Gus Height;  Location: [facility];  Service: Gynecology;  Laterality: N/A;  . LAPAROSCOPY N/A 07/23/2016   Procedure: LAPAROSCOPY OPERATIVE, LYSIS OF ADHESIONS;  Surgeon: Brien Few, MD;  Location: [facility];  Service: Gynecology;   Laterality: N/A;  . NOSE SURGERY  2015  . WISDOM TOOTH EXTRACTION  2008- ORAL SURG OFFICE   Family History: family history is not on file. Social History:  reports that she quit smoking about 6 years ago. Her smoking use included Cigarettes. She has a 0.07 pack-year smoking history. She has never used smokeless tobacco. She reports that she does not drink alcohol or use drugs.     Maternal Diabetes: No Genetic Screening: Normal Maternal Ultrasounds/Referrals: Normal Fetal Ultrasounds or other Referrals:  None Maternal Substance Abuse:  No Significant Maternal Medications:  None Significant Maternal Lab Results:  None Other Comments:  None  Review of Systems  Eyes: Positive for blurred vision.  All other systems reviewed and are negative.  Maternal Medical History:  Contractions: Onset was less than 1 hour ago.   Frequency: rare.   Perceived severity is mild.    Fetal activity: Perceived fetal activity is normal.   Last perceived fetal movement was within the past hour.    Prenatal complications: PIH.   Prenatal Complications - Diabetes: none.    Dilation: 2.5 Effacement (%): 50 Station: -2 Exam by:: M.Lee, RNC Blood pressure (!) 140/96, pulse 88, temperature 98.4 F (36.9 C), temperature source Oral, resp. rate 18, height 5\' 3"  (1.6 m), weight 104.8 kg (231 lb), last menstrual period 07/06/2016, unknown if currently  breastfeeding. Maternal Exam:  Uterine Assessment: Contraction strength is mild.  Contraction frequency is rare.   Abdomen: Patient reports no abdominal tenderness. Fetal presentation: vertex  Introitus: Normal vulva. Normal vagina.  Ferning test: not done.  Nitrazine test: not done. Amniotic fluid character: not assessed.  Pelvis: questionable for delivery.   Cervix: Cervix evaluated by digital exam.     Physical Exam  Nursing note and vitals reviewed. Constitutional: She is oriented to person, place, and time. She appears well-developed and  well-nourished.  HENT:  Head: Normocephalic and atraumatic.  Neck: Normal range of motion. Neck supple.  Cardiovascular: Normal rate and regular rhythm.   Respiratory: Effort normal and breath sounds normal.  GI: Soft. Bowel sounds are normal.  Genitourinary: Vagina normal and uterus normal. Rectal exam shows guaiac negative stool.  Musculoskeletal: Normal range of motion.  Neurological: She is alert and oriented to person, place, and time. She has normal reflexes.  Skin: Skin is warm and dry.  Psychiatric: She has a normal mood and affect.    Prenatal labs: ABO, Rh: --/--/O POS (10/22 1743) Antibody: NEG (10/22 1743) Rubella: Immune (10/22 1538) RPR: Nonreactive (10/22 1538)  HBsAg: Negative (10/22 1538)  HIV: Non-reactive (10/22 1538)  GBS: Negative (10/22 1538)   Assessment/Plan: Gestational HTN , ? PEC, new onset HA Serial labs IOL cytotec   Meghan Warshawsky J 07/04/2017, 8:26 PM

## 2017-07-05 ENCOUNTER — Encounter (HOSPITAL_COMMUNITY): Payer: Self-pay | Admitting: Anesthesiology

## 2017-07-05 ENCOUNTER — Inpatient Hospital Stay (HOSPITAL_COMMUNITY): Payer: 59 | Admitting: Anesthesiology

## 2017-07-05 ENCOUNTER — Encounter (HOSPITAL_COMMUNITY)
Admission: AD | Disposition: A | Discharge status: Home / Self Care | Payer: Self-pay | Source: Ambulatory Visit | Attending: Obstetrics and Gynecology

## 2017-07-05 ENCOUNTER — Encounter (HOSPITAL_COMMUNITY): Payer: Self-pay | Admitting: Obstetrics

## 2017-07-05 LAB — CBC
HEMATOCRIT: 32.1 % — AB (ref 36.0–46.0)
HEMOGLOBIN: 11.3 g/dL — AB (ref 12.0–15.0)
MCH: 30.7 pg (ref 26.0–34.0)
MCHC: 35.2 g/dL (ref 30.0–36.0)
MCV: 87.2 fL (ref 78.0–100.0)
Platelets: 196 10*3/uL (ref 150–400)
RBC: 3.68 MIL/uL — ABNORMAL LOW (ref 3.87–5.11)
RDW: 13.7 % (ref 11.5–15.5)
WBC: 21.7 10*3/uL — ABNORMAL HIGH (ref 4.0–10.5)

## 2017-07-05 LAB — RPR: RPR: NONREACTIVE

## 2017-07-05 SURGERY — Surgical Case
Anesthesia: Epidural

## 2017-07-05 MED ORDER — NALBUPHINE HCL 10 MG/ML IJ SOLN: 5.0000 mg | Freq: Once | INTRAMUSCULAR | Status: DC | PRN

## 2017-07-05 MED ORDER — KETOROLAC TROMETHAMINE 30 MG/ML IJ SOLN: 30.0000 mg | Freq: Four times a day (QID) | INTRAMUSCULAR | Status: DC | PRN

## 2017-07-05 MED ORDER — TETANUS-DIPHTH-ACELL PERTUSSIS 5-2.5-18.5 LF-MCG/0.5 IM SUSP: 0.5000 mL | Freq: Once | INTRAMUSCULAR | Status: DC

## 2017-07-05 MED ORDER — OXYCODONE-ACETAMINOPHEN 5-325 MG PO TABS
1.0000 | ORAL_TABLET | ORAL | Status: DC | PRN
  Administered 2017-07-06 – 2017-07-07 (×3): 1 via ORAL
  Filled 2017-07-05 (×3): qty 1

## 2017-07-05 MED ORDER — DEXAMETHASONE SODIUM PHOSPHATE 4 MG/ML IJ SOLN
INTRAMUSCULAR | Status: AC
  Filled 2017-07-05: qty 1

## 2017-07-05 MED ORDER — DIPHENHYDRAMINE HCL 25 MG PO CAPS
25.0000 mg | ORAL_CAPSULE | Freq: Four times a day (QID) | ORAL | Status: DC | PRN
  Administered 2017-07-05: 25 mg via ORAL
  Filled 2017-07-05: qty 1

## 2017-07-05 MED ORDER — LACTATED RINGERS IV SOLN
INTRAVENOUS | Status: DC | PRN
  Administered 2017-07-05: 13:00:00 via INTRAVENOUS

## 2017-07-05 MED ORDER — DIBUCAINE 1 % RE OINT: 1.0000 "application " | TOPICAL_OINTMENT | RECTAL | Status: DC | PRN

## 2017-07-05 MED ORDER — PHENYLEPHRINE 40 MCG/ML (10ML) SYRINGE FOR IV PUSH (FOR BLOOD PRESSURE SUPPORT)
80.0000 ug | PREFILLED_SYRINGE | INTRAVENOUS | Status: DC | PRN
  Filled 2017-07-05 (×2): qty 10

## 2017-07-05 MED ORDER — METHYLERGONOVINE MALEATE 0.2 MG PO TABS: 0.2000 mg | ORAL_TABLET | ORAL | Status: DC | PRN

## 2017-07-05 MED ORDER — BUPIVACAINE HCL (PF) 0.25 % IJ SOLN
INTRAMUSCULAR | Status: AC
  Filled 2017-07-05: qty 20

## 2017-07-05 MED ORDER — METOCLOPRAMIDE HCL 5 MG/ML IJ SOLN
INTRAMUSCULAR | Status: DC | PRN
  Administered 2017-07-05 (×2): 5 mg via INTRAVENOUS

## 2017-07-05 MED ORDER — PRENATAL MULTIVITAMIN CH
1.0000 | ORAL_TABLET | Freq: Every day | ORAL | Status: DC
  Administered 2017-07-06 – 2017-07-07 (×2): 1 via ORAL
  Filled 2017-07-05 (×2): qty 1

## 2017-07-05 MED ORDER — WITCH HAZEL-GLYCERIN EX PADS: 1.0000 "application " | MEDICATED_PAD | CUTANEOUS | Status: DC | PRN

## 2017-07-05 MED ORDER — PHENYLEPHRINE 40 MCG/ML (10ML) SYRINGE FOR IV PUSH (FOR BLOOD PRESSURE SUPPORT)
PREFILLED_SYRINGE | INTRAVENOUS | Status: AC
  Filled 2017-07-05: qty 10

## 2017-07-05 MED ORDER — MORPHINE SULFATE (PF) 0.5 MG/ML IJ SOLN
INTRAMUSCULAR | Status: DC | PRN
  Administered 2017-07-05: 3 mg via EPIDURAL

## 2017-07-05 MED ORDER — FENTANYL CITRATE (PF) 100 MCG/2ML IJ SOLN: 25.0000 ug | INTRAMUSCULAR | Status: DC | PRN

## 2017-07-05 MED ORDER — DIPHENHYDRAMINE HCL 50 MG/ML IJ SOLN: 12.5000 mg | INTRAMUSCULAR | Status: DC | PRN

## 2017-07-05 MED ORDER — MORPHINE SULFATE (PF) 0.5 MG/ML IJ SOLN
INTRAMUSCULAR | Status: AC
  Filled 2017-07-05: qty 10

## 2017-07-05 MED ORDER — KETOROLAC TROMETHAMINE 30 MG/ML IJ SOLN
30.0000 mg | Freq: Once | INTRAMUSCULAR | Status: AC
  Administered 2017-07-05: 30 mg via INTRAMUSCULAR

## 2017-07-05 MED ORDER — PHENYLEPHRINE 40 MCG/ML (10ML) SYRINGE FOR IV PUSH (FOR BLOOD PRESSURE SUPPORT): 80.0000 ug | PREFILLED_SYRINGE | INTRAVENOUS | Status: DC | PRN

## 2017-07-05 MED ORDER — METOCLOPRAMIDE HCL 5 MG/ML IJ SOLN
INTRAMUSCULAR | Status: AC
  Filled 2017-07-05: qty 2

## 2017-07-05 MED ORDER — NALBUPHINE HCL 10 MG/ML IJ SOLN: 5.0000 mg | INTRAMUSCULAR | Status: DC | PRN

## 2017-07-05 MED ORDER — SCOPOLAMINE 1 MG/3DAYS TD PT72
MEDICATED_PATCH | TRANSDERMAL | Status: AC
  Filled 2017-07-05: qty 1

## 2017-07-05 MED ORDER — DEXAMETHASONE SODIUM PHOSPHATE 4 MG/ML IJ SOLN
INTRAMUSCULAR | Status: DC | PRN
  Administered 2017-07-05: 4 mg via INTRAVENOUS

## 2017-07-05 MED ORDER — PHENYLEPHRINE HCL 10 MG/ML IJ SOLN
INTRAMUSCULAR | Status: DC | PRN
  Administered 2017-07-05 (×2): 80 ug via INTRAVENOUS
  Administered 2017-07-05: 120 ug via INTRAVENOUS

## 2017-07-05 MED ORDER — IBUPROFEN 600 MG PO TABS
600.0000 mg | ORAL_TABLET | Freq: Four times a day (QID) | ORAL | Status: DC
  Administered 2017-07-06 – 2017-07-07 (×7): 600 mg via ORAL
  Filled 2017-07-05 (×7): qty 1

## 2017-07-05 MED ORDER — METHYLERGONOVINE MALEATE 0.2 MG/ML IJ SOLN: 0.2000 mg | INTRAMUSCULAR | Status: DC | PRN

## 2017-07-05 MED ORDER — PROMETHAZINE HCL 25 MG/ML IJ SOLN
6.2500 mg | INTRAMUSCULAR | Status: DC | PRN
Start: 2017-07-05 — End: 2017-07-05

## 2017-07-05 MED ORDER — BUPIVACAINE HCL (PF) 0.25 % IJ SOLN
INTRAMUSCULAR | Status: DC | PRN
  Administered 2017-07-05: 20 mL

## 2017-07-05 MED ORDER — ACETAMINOPHEN 325 MG PO TABS: 650.0000 mg | ORAL_TABLET | ORAL | Status: DC | PRN

## 2017-07-05 MED ORDER — LACTATED RINGERS IV SOLN
500.0000 mL | Freq: Once | INTRAVENOUS | Status: AC
  Administered 2017-07-05: 500 mL via INTRAVENOUS

## 2017-07-05 MED ORDER — LACTATED RINGERS IV SOLN
INTRAVENOUS | Status: DC
  Administered 2017-07-05: 09:00:00 via INTRAUTERINE

## 2017-07-05 MED ORDER — SODIUM BICARBONATE 8.4 % IV SOLN
INTRAVENOUS | Status: DC | PRN
  Administered 2017-07-05: 10 mL via EPIDURAL
  Administered 2017-07-05: 2 mL via EPIDURAL
  Administered 2017-07-05: 5 mL via EPIDURAL

## 2017-07-05 MED ORDER — EPHEDRINE 5 MG/ML INJ: 10.0000 mg | INTRAVENOUS | Status: DC | PRN

## 2017-07-05 MED ORDER — ONDANSETRON HCL 4 MG/2ML IJ SOLN: 4.0000 mg | Freq: Three times a day (TID) | INTRAMUSCULAR | Status: DC | PRN

## 2017-07-05 MED ORDER — SENNOSIDES-DOCUSATE SODIUM 8.6-50 MG PO TABS
2.0000 | ORAL_TABLET | ORAL | Status: DC
  Administered 2017-07-06 (×2): 2 via ORAL
  Filled 2017-07-05 (×2): qty 2

## 2017-07-05 MED ORDER — SIMETHICONE 80 MG PO CHEW
80.0000 mg | CHEWABLE_TABLET | Freq: Three times a day (TID) | ORAL | Status: DC
  Administered 2017-07-05 – 2017-07-07 (×3): 80 mg via ORAL
  Filled 2017-07-05 (×4): qty 1

## 2017-07-05 MED ORDER — LIDOCAINE-EPINEPHRINE (PF) 2 %-1:200000 IJ SOLN
INTRAMUSCULAR | Status: AC
  Filled 2017-07-05: qty 20

## 2017-07-05 MED ORDER — ONDANSETRON HCL 4 MG/2ML IJ SOLN
INTRAMUSCULAR | Status: AC
  Filled 2017-07-05: qty 2

## 2017-07-05 MED ORDER — SCOPOLAMINE 1 MG/3DAYS TD PT72
MEDICATED_PATCH | TRANSDERMAL | Status: DC | PRN
  Administered 2017-07-05: 1 via TRANSDERMAL

## 2017-07-05 MED ORDER — LACTATED RINGERS IV SOLN
INTRAVENOUS | Status: DC
  Administered 2017-07-05 – 2017-07-06 (×2): via INTRAVENOUS

## 2017-07-05 MED ORDER — TERBUTALINE SULFATE 1 MG/ML IJ SOLN
0.2500 mg | Freq: Once | INTRAMUSCULAR | Status: AC | PRN
  Administered 2017-07-05: 0.25 mg via SUBCUTANEOUS

## 2017-07-05 MED ORDER — ONDANSETRON HCL 4 MG/2ML IJ SOLN
INTRAMUSCULAR | Status: DC | PRN
  Administered 2017-07-05: 4 mg via INTRAVENOUS

## 2017-07-05 MED ORDER — MEPERIDINE HCL 25 MG/ML IJ SOLN: 6.2500 mg | INTRAMUSCULAR | Status: DC | PRN

## 2017-07-05 MED ORDER — ACETAMINOPHEN 500 MG PO TABS
1000.0000 mg | ORAL_TABLET | Freq: Four times a day (QID) | ORAL | Status: DC
  Administered 2017-07-05 – 2017-07-06 (×3): 1000 mg via ORAL
  Filled 2017-07-05 (×3): qty 2

## 2017-07-05 MED ORDER — KETOROLAC TROMETHAMINE 30 MG/ML IJ SOLN
INTRAMUSCULAR | Status: AC
  Administered 2017-07-05: 30 mg via INTRAMUSCULAR
  Filled 2017-07-05: qty 1

## 2017-07-05 MED ORDER — OXYTOCIN 10 UNIT/ML IJ SOLN
INTRAMUSCULAR | Status: AC
  Filled 2017-07-05: qty 4

## 2017-07-05 MED ORDER — SODIUM CHLORIDE 0.9% FLUSH: 3.0000 mL | INTRAVENOUS | Status: DC | PRN

## 2017-07-05 MED ORDER — CEFAZOLIN SODIUM-DEXTROSE 2-3 GM-%(50ML) IV SOLR
INTRAVENOUS | Status: DC | PRN
  Administered 2017-07-05: 2 g via INTRAVENOUS

## 2017-07-05 MED ORDER — MENTHOL 3 MG MT LOZG
1.0000 | LOZENGE | OROMUCOSAL | Status: DC | PRN
  Administered 2017-07-06: 3 mg via ORAL
  Filled 2017-07-05 (×2): qty 9

## 2017-07-05 MED ORDER — LACTATED RINGERS IV SOLN
INTRAVENOUS | Status: DC | PRN
  Administered 2017-07-05 (×2): via INTRAVENOUS

## 2017-07-05 MED ORDER — COCONUT OIL OIL
1.0000 "application " | TOPICAL_OIL | Status: DC | PRN
  Administered 2017-07-05: 1 via TOPICAL
  Filled 2017-07-05: qty 120

## 2017-07-05 MED ORDER — SIMETHICONE 80 MG PO CHEW
80.0000 mg | CHEWABLE_TABLET | ORAL | Status: DC | PRN
  Filled 2017-07-05: qty 1

## 2017-07-05 MED ORDER — NALOXONE HCL 2 MG/2ML IJ SOSY: 1.0000 ug/kg/h | PREFILLED_SYRINGE | INTRAVENOUS | Status: DC | PRN

## 2017-07-05 MED ORDER — SIMETHICONE 80 MG PO CHEW
80.0000 mg | CHEWABLE_TABLET | ORAL | Status: DC
  Administered 2017-07-06 (×2): 80 mg via ORAL
  Filled 2017-07-05 (×2): qty 1

## 2017-07-05 MED ORDER — OXYTOCIN 10 UNIT/ML IJ SOLN
INTRAMUSCULAR | Status: AC
  Filled 2017-07-05: qty 1

## 2017-07-05 MED ORDER — OXYTOCIN 40 UNITS IN LACTATED RINGERS INFUSION - SIMPLE MED
1.0000 m[IU]/min | INTRAVENOUS | Status: DC
  Administered 2017-07-05: 2 m[IU]/min via INTRAVENOUS
  Filled 2017-07-05: qty 1000

## 2017-07-05 MED ORDER — OXYTOCIN 40 UNITS IN LACTATED RINGERS INFUSION - SIMPLE MED: 2.5000 [IU]/h | INTRAVENOUS | Status: DC

## 2017-07-05 MED ORDER — LIDOCAINE HCL (PF) 1 % IJ SOLN
INTRAMUSCULAR | Status: DC | PRN
  Administered 2017-07-05: 13 mL via EPIDURAL

## 2017-07-05 MED ORDER — OXYCODONE-ACETAMINOPHEN 5-325 MG PO TABS
2.0000 | ORAL_TABLET | ORAL | Status: DC | PRN
Start: 2017-07-05 — End: 2017-07-07

## 2017-07-05 MED ORDER — ZOLPIDEM TARTRATE 5 MG PO TABS: 5.0000 mg | ORAL_TABLET | Freq: Every evening | ORAL | Status: DC | PRN

## 2017-07-05 MED ORDER — SCOPOLAMINE 1 MG/3DAYS TD PT72: 1.0000 | MEDICATED_PATCH | Freq: Once | TRANSDERMAL | Status: DC

## 2017-07-05 MED ORDER — NALOXONE HCL 0.4 MG/ML IJ SOLN: 0.4000 mg | INTRAMUSCULAR | Status: DC | PRN

## 2017-07-05 MED ORDER — SODIUM BICARBONATE 8.4 % IV SOLN
INTRAVENOUS | Status: AC
  Filled 2017-07-05: qty 50

## 2017-07-05 MED ORDER — FENTANYL 2.5 MCG/ML BUPIVACAINE 1/10 % EPIDURAL INFUSION (WH - ANES)
14.0000 mL/h | INTRAMUSCULAR | Status: DC | PRN
  Administered 2017-07-05 (×2): 14 mL/h via EPIDURAL
  Filled 2017-07-05 (×2): qty 100

## 2017-07-05 MED ORDER — OXYTOCIN 10 UNIT/ML IJ SOLN
INTRAVENOUS | Status: DC | PRN
  Administered 2017-07-05: 40 [IU] via INTRAVENOUS

## 2017-07-05 SURGICAL SUPPLY — 40 items
APL SKNCLS STERI-STRIP NONHPOA (GAUZE/BANDAGES/DRESSINGS) ×1
BENZOIN TINCTURE PRP APPL 2/3 (GAUZE/BANDAGES/DRESSINGS) ×1 IMPLANT
CHLORAPREP W/TINT 26ML (MISCELLANEOUS) ×2 IMPLANT
CLAMP CORD UMBIL (MISCELLANEOUS) IMPLANT
CLOTH BEACON ORANGE TIMEOUT ST (SAFETY) ×2 IMPLANT
CONTAINER PREFILL 10% NBF 15ML (MISCELLANEOUS) IMPLANT
DRSG OPSITE POSTOP 4X10 (GAUZE/BANDAGES/DRESSINGS) ×2 IMPLANT
ELECT REM PT RETURN 9FT ADLT (ELECTROSURGICAL) ×2
ELECTRODE REM PT RTRN 9FT ADLT (ELECTROSURGICAL) ×1 IMPLANT
EXTRACTOR VACUUM M CUP 4 TUBE (SUCTIONS) IMPLANT
GLOVE BIO SURGEON STRL SZ7.5 (GLOVE) ×2 IMPLANT
GLOVE BIOGEL PI IND STRL 7.0 (GLOVE) ×1 IMPLANT
GLOVE BIOGEL PI INDICATOR 7.0 (GLOVE) ×1
GOWN STRL REUS W/TWL LRG LVL3 (GOWN DISPOSABLE) ×4 IMPLANT
KIT ABG SYR 3ML LUER SLIP (SYRINGE) IMPLANT
NDL HYPO 25X5/8 SAFETYGLIDE (NEEDLE) IMPLANT
NDL SPNL 20GX3.5 QUINCKE YW (NEEDLE) IMPLANT
NEEDLE HYPO 22GX1.5 SAFETY (NEEDLE) ×2 IMPLANT
NEEDLE HYPO 25X5/8 SAFETYGLIDE (NEEDLE) IMPLANT
NEEDLE SPNL 20GX3.5 QUINCKE YW (NEEDLE) IMPLANT
NS IRRIG 1000ML POUR BTL (IV SOLUTION) ×2 IMPLANT
PACK C SECTION WH (CUSTOM PROCEDURE TRAY) ×2 IMPLANT
PAD ABD 7.5X8 STRL (GAUZE/BANDAGES/DRESSINGS) ×1 IMPLANT
PENCIL SMOKE EVAC W/HOLSTER (ELECTROSURGICAL) ×2 IMPLANT
STRIP CLOSURE SKIN 1/2X4 (GAUZE/BANDAGES/DRESSINGS) ×1 IMPLANT
SUT MNCRL 0 VIOLET CTX 36 (SUTURE) ×2 IMPLANT
SUT MNCRL AB 3-0 PS2 27 (SUTURE) IMPLANT
SUT MON AB 2-0 CT1 27 (SUTURE) ×2 IMPLANT
SUT MON AB-0 CT1 36 (SUTURE) ×4 IMPLANT
SUT MONOCRYL 0 CTX 36 (SUTURE) ×3
SUT PLAIN 0 NONE (SUTURE) IMPLANT
SUT PLAIN 2 0 (SUTURE)
SUT PLAIN 2 0 XLH (SUTURE) ×1 IMPLANT
SUT PLAIN ABS 2-0 CT1 27XMFL (SUTURE) IMPLANT
SUT VIC AB 4-0 KS 27 (SUTURE) ×1 IMPLANT
SYR 20CC LL (SYRINGE) IMPLANT
SYR CONTROL 10ML LL (SYRINGE) ×2 IMPLANT
TAPE CLOTH SURG 4X10 WHT LF (GAUZE/BANDAGES/DRESSINGS) ×1 IMPLANT
TOWEL OR 17X24 6PK STRL BLUE (TOWEL DISPOSABLE) ×2 IMPLANT
TRAY FOLEY BAG SILVER LF 14FR (SET/KITS/TRAYS/PACK) ×2 IMPLANT

## 2017-07-05 NOTE — Progress Notes (Signed)
[name] is a 31 y.o. G5P0040 at [redacted]w[redacted]d by LMP admitted for induction of labor due to Hypertension.  Subjective: Com,fortable  Objective: BP 122/79   Pulse (!) 107   Temp 98.4 F (36.9 C) (Oral)   Resp 18   Ht 5\' 3"  (1.6 m)   Wt 104.8 kg (231 lb)   LMP 07/06/2016 (Exact Date)   SpO2 99%   BMI 40.92 kg/m  No intake/output data recorded. No intake/output data recorded.  FHT:  FHR: 145 bpm, variability: FHR: 145 bpm, variability: moderate,  accelerations:  Present,  decelerations:  Present rare,  accelerations:  Present,  decelerations:  Absent UC:   regular, every 2-4 minutes SVE:   Dilation: 3.5 Effacement (%): 80 Station: -1, 0 Exam by:: dr Ronita Hipps  AROM - meconium IUPC placed Labs: Lab Results  Component Value Date   WBC 11.1 (H) 07/04/2017   HGB 12.8 07/04/2017   HCT 36.0 07/04/2017   MCV 86.3 07/04/2017   PLT 231 07/04/2017    Assessment / Plan: Induction of labor due to gestational hypertension,  progressing well on pitocin  Labor: Progressing normally Preeclampsia:  no signs or symptoms of toxicity, intake and ouput balanced and labs stable Fetal Wellbeing:  Category I Pain Control:  Epidural I/D:  n/a Anticipated MOD:  NSVD  Hillery Zachman J 07/05/2017, 7:58 AM

## 2017-07-05 NOTE — Progress Notes (Signed)
Called to bedside for prolonged late deceleration FHR  to 60bpm - total time for decel 5.5 minutes  Checked by nurse and SVE: 5/80/-1 Pitocin was at 2 milliunits and turned off IVF bolus 519mL infusing O2 by face mask applied  Left lateral position   O:  VS: Blood pressure 110/66, pulse 78, temperature 97.9 F (36.6 C), temperature source Oral, resp. rate 17, height 5\' 3"  (1.6 m), weight 104.8 kg (231 lb), last menstrual period 07/06/2016, SpO2 98 %, unknown if currently breastfeeding.        FHR : baseline 125 bpm / variability moderate / accelerations + / prolonged late deceleration - see note above        Toco: contractions every 1-2 minutes        Cervix : Dilation: 5 Effacement (%): 80 Station: -1 Presentation: Vertex Exam by:: stone rnc         Membranes: MSF  A: Latent labor     FHR category 2 with progression to Category 1  P: Called to update and consult Dr. Ronita Hipps while in room     Begin Amnioinfusion 381mL bolus then 164mL/hr     IUPC appears to be working - not replaced     FSE was previously applied - working well     Terbutaline 0.25mg  SQ given x 1     Continue close monitoring       FHR recovered well for 20 minutes and resumed to be a Category 1 tracing, and verbal order given to [employer], RN okay to restart Pitocin at 2 milliunits per Dr. Ronita Hipps at [place], MSN, CNM [facility]

## 2017-07-05 NOTE — Anesthesia Postprocedure Evaluation (Signed)
Anesthesia Post Note  Patient: [name]  Procedure(s) Performed: CESAREAN SECTION (N/A )     Patient location during evaluation: Mother Baby Anesthesia Type: Epidural Level of consciousness: awake, awake and alert, oriented and patient cooperative Pain management: pain level controlled Vital Signs Assessment: post-procedure vital signs reviewed and stable Respiratory status: spontaneous breathing, nonlabored ventilation and respiratory function stable Cardiovascular status: stable Postop Assessment: no headache, no backache, patient able to bend at knees and no apparent nausea or vomiting Anesthetic complications: no    Last Vitals:  Vitals:   07/05/17 1820 07/05/17 1925  BP: 129/74 121/70  Pulse: 85 (!) 103  Resp: 18 18  Temp: 37.9 C 36.5 C  SpO2: 98% 99%    Last Pain:  Vitals:   07/05/17 1925  TempSrc: Axillary  PainSc: 0-No pain   Pain Goal:                 Arvo Ealy L

## 2017-07-05 NOTE — Op Note (Signed)
Cesarean Section Procedure Note  Indications: non-reassuring fetal status  Pre-operative Diagnosis: 39 week 4 day pregnancy.  Post-operative Diagnosis: same  Surgeon: Lovenia Kim   Assistants: Claudette Laws, CNM  Anesthesia: Epidural anesthesia and Local anesthesia 0.25.% bupivacaine  ASA Class: 2  Procedure Details  The patient was seen in the Holding Room. The risks, benefits, complications, treatment options, and expected outcomes were discussed with the patient.  The patient concurred with the proposed plan, giving informed consent. The risks of anesthesia, infection, bleeding and possible injury to other organs discussed. Injury to bowel, bladder, or ureter with possible need for repair discussed. Possible need for transfusion with secondary risks of hepatitis or HIV acquisition discussed. Post operative complications to include but not limited to DVT, PE and Pneumonia noted. The site of surgery properly noted/marked. The patient was taken to Operating Room # 9, identified as LASHARON DUNIVAN and the procedure verified as C-Section Delivery. A Time Out was held and the above information confirmed.  After induction of anesthesia, the patient was draped and prepped in the usual sterile manner. A Pfannenstiel incision was made and carried down through the subcutaneous tissue to the fascia. Fascial incision was made and extended transversely using Mayo scissors. The fascia was separated from the underlying rectus tissue superiorly and inferiorly. The peritoneum was identified and entered. Peritoneal incision was extended longitudinally. The utero-vesical peritoneal reflection was incised transversely and the bladder flap was bluntly freed from the lower uterine segment. A low transverse uterine incision(Kerr hysterotomy) was made. Delivered from OP presentation was a  female with Apgar scores of 8 at one minute and 9 at five minutes. Bulb suctioning gently performed. Neonatal team in attendance.After  the umbilical cord was clamped and cut cord blood was obtained for evaluation. The placenta was removed intact and appeared normal. The uterus was curetted with a dry lap pack. Good hemostasis was noted.The uterine outline, tubes and ovaries appeared normal. The uterine incision was closed with running locked sutures of 0 Monocryl x 2 layers. Hemostasis was observed. OLeary stitch at right side placed without difficulty. Good hemostasis noted. Uterus exteriorized and inspected. The parietal peritoneum was closed with a running 2-0 Monocryl suture. The fascia was then reapproximated with running sutures of 0 Monocryl. The skin was reapproximated with 4-0 vicryl after Blende closure with 2-0 plain.  Instrument, sponge, and needle counts were correct prior the abdominal closure and at the conclusion of the case.   Findings: FTLM, OP, anterior placenta  Estimated Blood Loss:  1000         Drains: foley                 Specimens: placenta                 Complications:  None; patient tolerated the procedure well.         Disposition: PACU - hemodynamically stable.         Condition: stable  Attending Attestation: I performed the procedure.

## 2017-07-05 NOTE — Anesthesia Procedure Notes (Signed)
Epidural Patient location during procedure: OB Start time: 07/05/2017 6:38 AM End time: 07/05/2017 6:52 AM  Staffing Anesthesiologist: Candida Peeling RAY Performed: anesthesiologist   Preanesthetic Checklist Completed: patient identified, site marked, surgical consent, pre-op evaluation, timeout performed, IV checked, risks and benefits discussed and monitors and equipment checked  Epidural Patient position: sitting Prep: ChloraPrep Patient monitoring: heart rate, cardiac monitor, continuous pulse ox and blood pressure Approach: midline Location: L2-L3 Injection technique: LOR saline  Needle:  Needle type: Tuohy  Needle gauge: 17 G Needle length: 9 cm Needle insertion depth: 5 cm Catheter type: closed end flexible Catheter size: 20 Guage Catheter at skin depth: 9 cm Test dose: negative  Assessment Events: blood not aspirated, injection not painful, no injection resistance, negative IV test and no paresthesia  Additional Notes Reason for block:procedure for pain

## 2017-07-05 NOTE — Progress Notes (Signed)
[name] is a 31 y.o. G5P0040 at 102w4d by LMP admitted for induction of labor due to Hypertension.  Subjective: Comfortable. No headache or CP or SOB  Objective: BP 124/75 (BP Location: Left Arm)   Pulse 88   Temp 98.1 F (36.7 C) (Oral)   Resp 20   Ht 5\' 3"  (1.6 m)   Wt 104.8 kg (231 lb)   LMP 07/06/2016 (Exact Date)   SpO2 99%   BMI 40.92 kg/m  No intake/output data recorded. No intake/output data recorded.  FHT:  FHR: 135 bpm, variability: moderate,  accelerations:  Present,  decelerations:  Present rare UC:   irregular, every 2-5 minutes SVE:   Dilation: 2.5 Effacement (%): 50 Station: -2 Exam by:: [place] Nurse, RN  Labs: Lab Results  Component Value Date   WBC 11.1 (H) 07/04/2017   HGB 12.8 07/04/2017   HCT 36.0 07/04/2017   MCV 86.3 07/04/2017   PLT 231 07/04/2017    Assessment / Plan: Induction of labor due to gestational hypertension,  progressing well on pitocin  Labor: Progressing on Pitocin, will continue to increase then AROM Preeclampsia:  no signs or symptoms of toxicity, intake and ouput balanced and labs stable Fetal Wellbeing:  Category I Pain Control:  Labor support without medications I/D:  n/a Anticipated MOD:  NSVD  Jackson Coffield J 07/05/2017, 6:33 AM

## 2017-07-05 NOTE — Progress Notes (Signed)
Patient ID: [name], female   DOB: November 19, 1985, 31 y.o.   MRN: [id number] Category 1 tracing Continue Pitocin/IUPC

## 2017-07-05 NOTE — Progress Notes (Signed)
Patient ID: [name], female   DOB: 1985-09-15, 31 y.o.   MRN: [id number] Non reassuring FHR tracing Intermittent prolonged decels- Category 2-3 VE : no change, molding noted Proceed with csection. Consent done. OR notified

## 2017-07-05 NOTE — Addendum Note (Signed)
Addendum  created 07/05/17 1948 by Raenette Rover, CRNA   Sign clinical note

## 2017-07-05 NOTE — Progress Notes (Signed)
Attended bedside for prolonged deceleration. Pt. Making cervical change. Alternating lateral positioning, O2 by facemask, IVF bolus 525mL, and Pitocin turned off.   O:  VS: Blood pressure 116/89, pulse (!) 107, temperature 97.6 F (36.4 C), temperature source Oral, resp. rate 18, height 5\' 3"  (1.6 m), weight 104.8 kg (231 lb), last menstrual period 07/06/2016, SpO2 100 %, unknown if currently breastfeeding.        FHR : baseline 120-130 bpm / variability moderate / accelerations + / prolonged deceleration to 60-90 bpm        Toco: contractions every        Cervix : Dilation: 7.5 Effacement (%): 80 Station: -1, 0 Presentation: Vertex Exam by:: Stone RN        Cervix reassessed by me - unchanged from RN eval. Baby feels asynclitic with some molding. More cervix noted on the right side.         Membranes: ruptured; MSF on AROM  A: Active labor     FHR category 2 with progression to Category 1  P: FSE replaced       FHR recovered well, now Category 1 tracing     Consulted and updated Dr. Ronita Hipps - okay to restart Pitocin at 2 milliunits now     Continue close monitoring      Continue lateral positioning      Cautious for NSVD   Lars Pinks, MSN, CNM [facility]

## 2017-07-05 NOTE — Anesthesia Preprocedure Evaluation (Signed)
Anesthesia Evaluation  Patient identified by MRN, date of birth, ID band Patient awake    Reviewed: Allergy & Precautions, NPO status , Patient's Chart, lab work & pertinent test results  Airway Mallampati: I  TM Distance: >3 FB Neck ROM: Full    Dental  (+) Teeth Intact   Pulmonary former smoker,    breath sounds clear to auscultation       Cardiovascular negative cardio ROS  + Valvular Problems/Murmurs  Rhythm:Regular Rate:Normal     Neuro/Psych  Headaches, PSYCHIATRIC DISORDERS Anxiety    GI/Hepatic negative GI ROS, Neg liver ROS,   Endo/Other  Hypothyroidism   Renal/GU Renal disease     Musculoskeletal negative musculoskeletal ROS (+)   Abdominal   Peds  Hematology negative hematology ROS (+)   Anesthesia Other Findings   Reproductive/Obstetrics (+) Pregnancy                             Lab Results  Component Value Date   WBC 11.1 (H) 07/04/2017   HGB 12.8 07/04/2017   HCT 36.0 07/04/2017   MCV 86.3 07/04/2017   PLT 231 07/04/2017   Lab Results  Component Value Date   CREATININE 0.61 07/04/2017   BUN 10 07/04/2017   NA 135 07/04/2017   K 4.0 07/04/2017   CL 107 07/04/2017   CO2 19 (L) 07/04/2017   No results found for: INR, PROTIME   Anesthesia Physical  Anesthesia Plan  ASA: II  Anesthesia Plan: Epidural   Post-op Pain Management:    Induction:   PONV Risk Score and Plan:   Airway Management Planned:   Additional Equipment:   Intra-op Plan:   Post-operative Plan:   Informed Consent: I have reviewed the patients History and Physical, chart, labs and discussed the procedure including the risks, benefits and alternatives for the proposed anesthesia with the patient or authorized representative who has indicated his/her understanding and acceptance.     Plan Discussed with: CRNA  Anesthesia Plan Comments:         Anesthesia Quick Evaluation

## 2017-07-05 NOTE — Anesthesia Postprocedure Evaluation (Signed)
Anesthesia Post Note  Patient: [name]  Procedure(s) Performed: CESAREAN SECTION (N/A )     Patient location during evaluation: Mother Baby Anesthesia Type: Epidural Level of consciousness: awake and alert Pain management: pain level controlled Vital Signs Assessment: post-procedure vital signs reviewed and stable Respiratory status: spontaneous breathing, nonlabored ventilation and respiratory function stable Cardiovascular status: stable Postop Assessment: no headache, no backache, epidural receding and patient able to bend at knees Anesthetic complications: no    Last Vitals:  Vitals:   07/05/17 1445 07/05/17 1500  BP: 110/73 123/87  Pulse: 94 88  Resp: 19 (!) 22  Temp:  36.7 C  SpO2: 94% 100%    Last Pain:  Vitals:   07/05/17 1500  TempSrc: Oral  PainSc:    Pain Goal:                 Audry Pili

## 2017-07-05 NOTE — Lactation Note (Addendum)
This note was copied from a baby's chart. Lactation Consultation Note  Patient Name: [name] JMEQA'S Date: 07/05/2017   P1, Baby 60 hours old and has had short feedings.  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  Will bring card in tomorrow for pump. Offered to help mother with hand expression and breastfeeding but mother declined assistance. Mother states she has been pumping w/ manual pump and giving it back to baby on spoon. Discussed undressing and placing STS to interest baby in feeding and watching for feeding cues. Reviewed feeding frequency and length of feeding.   Mom made aware of O/P services, breastfeeding support groups, community resources, and our phone # for post-discharge questions.         Maternal Data    Feeding    LATCH Score                   Interventions    Lactation Tools Discussed/Used     Consult Status      Carlye Grippe 07/05/2017, 10:58 PM

## 2017-07-05 NOTE — Progress Notes (Signed)
Patient ID: [name], female   DOB: Oct 30, 1985, 31 y.o.   MRN: [id number] PRevious decel noted Category 1 tracing now BP 120/80   Pulse (!) 116   Temp 97.6 F (36.4 C) (Oral)   Resp 18   Ht 5\' 3"  (1.6 m)   Wt 104.8 kg (231 lb)   LMP 07/06/2016 (Exact Date)   SpO2 100%   BMI 40.92 kg/m   Continue Pitocin

## 2017-07-05 NOTE — Transfer of Care (Signed)
Immediate Anesthesia Transfer of Care Note  Patient: [name]  Procedure(s) Performed: CESAREAN SECTION (N/A )  Patient Location: PACU  Anesthesia Type:Epidural  Level of Consciousness: awake, alert  and oriented  Airway & Oxygen Therapy: Patient Spontanous Breathing  Post-op Assessment: Report given to RN and Post -op Vital signs reviewed and stable  Post vital signs: Reviewed and stable  Last Vitals:  Vitals:   07/05/17 1200 07/05/17 1227  BP: 121/79 (!) 122/105  Pulse: 86 (!) 116  Resp: 18 18  Temp:    SpO2: 99%     Last Pain:  Vitals:   07/05/17 1200  TempSrc:   PainSc: Asleep         Complications: No apparent anesthesia complications

## 2017-07-06 ENCOUNTER — Encounter (HOSPITAL_COMMUNITY): Payer: Self-pay | Admitting: Obstetrics and Gynecology

## 2017-07-06 DIAGNOSIS — D62 Acute posthemorrhagic anemia: Secondary | ICD-10-CM | POA: Diagnosis not present

## 2017-07-06 DIAGNOSIS — L231 Allergic contact dermatitis due to adhesives: Secondary | ICD-10-CM | POA: Diagnosis not present

## 2017-07-06 HISTORY — DX: Allergic contact dermatitis due to adhesives: L23.1

## 2017-07-06 LAB — CBC
HEMATOCRIT: 26 % — AB (ref 36.0–46.0)
Hemoglobin: 9.1 g/dL — ABNORMAL LOW (ref 12.0–15.0)
MCH: 31.1 pg (ref 26.0–34.0)
MCHC: 35 g/dL (ref 30.0–36.0)
MCV: 88.7 fL (ref 78.0–100.0)
PLATELETS: 174 10*3/uL (ref 150–400)
RBC: 2.93 MIL/uL — ABNORMAL LOW (ref 3.87–5.11)
RDW: 14 % (ref 11.5–15.5)
WBC: 12.7 10*3/uL — AB (ref 4.0–10.5)

## 2017-07-06 MED ORDER — HYDROCORTISONE 1 % EX CREA
TOPICAL_CREAM | Freq: Two times a day (BID) | CUTANEOUS | Status: DC
  Administered 2017-07-06 – 2017-07-07 (×3): via TOPICAL
  Filled 2017-07-06: qty 28

## 2017-07-06 MED ORDER — POLYSACCHARIDE IRON COMPLEX 150 MG PO CAPS
150.0000 mg | ORAL_CAPSULE | Freq: Every day | ORAL | Status: DC
  Administered 2017-07-06 – 2017-07-07 (×2): 150 mg via ORAL
  Filled 2017-07-06 (×2): qty 1

## 2017-07-06 MED ORDER — POLYSACCHARIDE IRON COMPLEX 150 MG PO CAPS: 150.0000 mg | ORAL_CAPSULE | Freq: Every day | ORAL | Status: DC

## 2017-07-06 MED ORDER — MAGNESIUM OXIDE 400 (241.3 MG) MG PO TABS
400.0000 mg | ORAL_TABLET | Freq: Every day | ORAL | Status: DC
  Administered 2017-07-06 – 2017-07-07 (×2): 400 mg via ORAL
  Filled 2017-07-06 (×2): qty 1

## 2017-07-06 NOTE — Lactation Note (Signed)
This note was copied from a baby's chart. Lactation Consultation Note  Patient Name: [name]'G Date: 07/06/2017 Reason for consult: Follow-up assessment Baby at 33 hr of life. Upon entry parents had just finished spoon feeding expressed milk. Baby was still cueing and mom was agreeable to latching. Mom has semi firm, wide spaced, cone shaped breast with short shaft nipples. Baby has a noticeable anterior lingual frenulum. Baby has a nice gape and will latch but can not maintain suction for more than 2-3 sucks. Applied #20 NS and baby was able to maintain long bursts of sucking with several swallows. Encouraged mom to continue latching baby, but express and spoon feed per volume guidelines as needed. Discussed baby behavior, feeding frequency, baby belly size, voids, wt loss, breast changes, and nipple care. Gave mom her [place]. Parents are aware of lactation services and support group.      Maternal Data Has patient been taught Hand Expression?: Yes Does the patient have breastfeeding experience prior to this delivery?: No  Feeding Feeding Type: Breast Fed Length of feed: 15 min  LATCH Score Latch: Repeated attempts needed to sustain latch, nipple held in mouth throughout feeding, stimulation needed to elicit sucking reflex.  Audible Swallowing: A few with stimulation  Type of Nipple: Everted at rest and after stimulation  Comfort (Breast/Nipple): Soft / non-tender  Hold (Positioning): Assistance needed to correctly position infant at breast and maintain latch.  LATCH Score: 7  Interventions Interventions: Breast feeding basics reviewed;Assisted with latch;Skin to skin;Breast massage;Hand express;Position options;Support pillows;DEBP  Lactation Tools Discussed/Used Tools: Nipple Shields Nipple shield size: 20 Pump Review: Setup, frequency, and cleaning;Milk Storage Initiated by:: ES Date initiated:: 07/06/17   Consult Status Consult Status:  Follow-up Date: 07/07/17 Follow-up type: In-patient    Denzil Hughes 07/06/2017, 10:03 PM

## 2017-07-06 NOTE — Progress Notes (Signed)
POSTOPERATIVE DAY # 1 S/P CS - NRFHR  S:         Reports feeling ok - rash on abdomen really itches             Tolerating po intake / no nausea / no vomiting / + flatus / no BM             Bleeding is light             Pain controlled with motrin             Up ad lib / ambulatory/ voiding QS  Newborn breast feeding  / Circumcision planned  O:  VS: BP 108/60 (BP Location: Right Arm)   Pulse 99   Temp 99 F (37.2 C) (Oral)   Resp 18   Ht 5\' 3"  (1.6 m)   Wt 104.8 kg (231 lb)   LMP 07/06/2016 (Exact Date)   SpO2 98%   Breastfeeding? Unknown   BMI 40.92 kg/m    LABS:               Recent Labs  07/05/17 1406 07/06/17 0538  WBC 21.7* 12.7*  HGB 11.3* 9.1*  PLT 196 174               Bloodtype: --/--/O POS (10/22 1743)  Rubella: Immune (10/22 1538)                tdap and flu vaccines current 2018                                          I&O: Intake/Output      10/23 0701 - 10/24 0700 10/24 0701 - 10/25 0700   P.O. 500    I.V. (mL/kg) 2565 (24.5)    Total Intake(mL/kg) 3065 (29.2)    Urine (mL/kg/hr) 1475 (0.6)    Blood 776    Total Output 2251     Net +814                     Physical Exam:             Alert and Oriented X3  Lungs: Clear and unlabored  Heart: regular rate and rhythm / no mumurs  Abdomen: soft, non-tender, non-distended, patchy rash with erythema over lower abdomen                                                                      (prep and adhesive dressing - states HX reaction to Bandaids in past)             Fundus: firm, non-tender, Ueven             Dressing intact              Incision:  approximated with suture / no erythema / no ecchymosis / no drainage  Perineum: intact  Lochia: light  Extremities: 2+edema, no calf pain or tenderness, negative Homans  A:        POD # 1 S/P CS - NRFHR            ABL anemia  Topical dermatitis on abdomen -likely reaction to adhesive based on location and history  P:        Routine  postoperative care              Start iron and magnesium tomorrow             Topical hydrocortisone - wash area well in shower today to remove residual prep/adhesive     Artelia Laroche CNM, MSN, [facility] 07/06/2017, 8:08 AM

## 2017-07-07 MED ORDER — MAGNESIUM OXIDE 400 (241.3 MG) MG PO TABS: 400.0000 mg | ORAL_TABLET | Freq: Every day | ORAL | 0 refills | Status: DC

## 2017-07-07 MED ORDER — HYDROCHLOROTHIAZIDE 12.5 MG PO CAPS
12.5000 mg | ORAL_CAPSULE | Freq: Every day | ORAL | Status: DC
  Administered 2017-07-07: 12.5 mg via ORAL
  Filled 2017-07-07: qty 1

## 2017-07-07 MED ORDER — IBUPROFEN 600 MG PO TABS: 600.0000 mg | ORAL_TABLET | Freq: Four times a day (QID) | ORAL | 0 refills | Status: DC

## 2017-07-07 MED ORDER — HYDROCHLOROTHIAZIDE 12.5 MG PO CAPS: 12.5000 mg | ORAL_CAPSULE | Freq: Every day | ORAL | 0 refills | Status: DC

## 2017-07-07 MED ORDER — POLYSACCHARIDE IRON COMPLEX 150 MG PO CAPS: 150.0000 mg | ORAL_CAPSULE | Freq: Every day | ORAL | 0 refills | Status: DC

## 2017-07-07 MED ORDER — TRAMADOL HCL 50 MG PO TABS: 50.0000 mg | ORAL_TABLET | Freq: Four times a day (QID) | ORAL | 0 refills | Status: AC | PRN

## 2017-07-07 MED FILL — MAGNESIUM OXIDE 400 MG TABS: 400 | 90 days supply | Qty: 90 | Fill #0

## 2017-07-07 MED FILL — POLY-IRON 150 MG CAPSULE: 150 | 45 days supply | Qty: 45 | Fill #0

## 2017-07-07 MED FILL — HYDROCHLOROTHIAZIDE 12.5 MG: 12.5 | 7 days supply | Qty: 7 | Fill #0

## 2017-07-07 MED FILL — OXYCODON-ACETAMINOPHEN 2.5-: 2.5-325 | 1 days supply | Qty: 10 | Fill #0

## 2017-07-07 MED FILL — IBUPROFEN 600 MG TABLET: 600 | 7 days supply | Qty: 30 | Fill #0

## 2017-07-07 NOTE — Discharge Summary (Signed)
OB Discharge Summary  Patient Name: [name] DOB: 10/08/1985 MRN: [id number]  Date of admission: 07/04/2017  Admitting diagnosis: gestational hypertension with headache / no evidence of PEC Intrauterine pregnancy: [redacted]w[redacted]d     Secondary diagnosis: moribd obesity   Date of discharge: 07/07/2017    Discharge diagnosis: Term Pregnancy Delivered / POD 2 s/p c-section for [name] / ABL anemia   Prenatal history: [id number]   EDC : 07/08/2017, by Patient Reported  Prenatal care at [facility]  Primary provider : [facility] Prenatal course complicated by obesity / migraines / gestational hypertnesion  Prenatal Labs: ABO, Rh: --/--/O POS (10/22 1743) Antibody: NEG (10/22 1743) Rubella: Immune (10/22 1538)   RPR: Non Reactive (10/22 1733)  HBsAg: Negative (10/22 1538)  HIV: Non-reactive (10/22 1538)  GBS: Negative (10/22 1538)                                    Hospital course:  Induction of Labor With Cesarean Section  31 y.o. yo [id number] at [redacted]w[redacted]d was admitted to the hospital 07/04/2017 for induction of labor. Patient had a labor course significant for FHR decelerations. The patient went for cesarean section due to Non-Reassuring FHR, and delivered a Viable infant,@BABYSUPPRESS (DBLINK,ept,110,,1,,) Membrane Rupture Time/Date: )7:50 AM ,07/05/2017   @Details  of operation can be found in separate operative Note.  Patient had an uncomplicated postpartum course. She is ambulating, tolerating a regular diet, passing flatus, and urinating well.  Patient is discharged home in stable condition on 07/07/17.                                   Augmentation: AROM Delivering PROVIDER: Brien Few                                                            Complications: None  Newborn Data: Live born female  Birth Weight: 8 lb 5.5 oz (3785 g) APGAR: 8, 9  Newborn Delivery   Birth date/time:  07/05/2017 12:45:00 Delivery type:  C-Section, Low Transverse  C-section  categorization:  Primary     Baby Feeding: Breast Disposition:home with mother  Post partum procedures:none  Labs: Lab Results  Component Value Date   WBC 12.7 (H) 07/06/2017   HGB 9.1 (L) 07/06/2017   HCT 26.0 (L) 07/06/2017   MCV 88.7 07/06/2017   PLT 174 07/06/2017   CMP Latest Ref Rng & Units 07/04/2017  Glucose 65 - 99 mg/dL 121(H)  BUN 6 - 20 mg/dL 10  Creatinine 0.44 - 1.00 mg/dL 0.61  Sodium 135 - 145 mmol/L 135  Potassium 3.5 - 5.1 mmol/L 4.0  Chloride 101 - 111 mmol/L 107  CO2 22 - 32 mmol/L 19(L)  Calcium 8.9 - 10.3 mg/dL 8.7(L)  Total Protein 6.5 - 8.1 g/dL 5.8(L)  Total Bilirubin 0.3 - 1.2 mg/dL 0.3  Alkaline Phos 38 - 126 U/L 136(H)  AST 15 - 41 U/L 23  ALT 14 - 54 U/L 13(L)      Physical Exam @ time of discharge:  Vitals:   07/06/17 1119 07/06/17 1128 07/06/17 1821 07/07/17 0529  BP: 134/74  128/86  130/82  Pulse: 85  95 (!) 108  Resp: 18  18 18   Temp:  98 F (36.7 C) 98.7 F (37.1 C) 98.8 F (37.1 C)  TempSrc:  Axillary Oral   SpO2:      Weight:      Height:        General: alert, cooperative and no distress Lochia: appropriate Uterine Fundus: firm Perineum: intact Incision: No significant erythema / intact  / dressing intact without drainage Extremities: 3+ edema - dependent DVT Evaluation: No evidence of DVT seen on physical exam.   Discharge instructions:  "Baby and Me Booklet" and [place] Booklet  Discharge Medications:  Allergies as of 07/07/2017      Reactions   Macrobid [nitrofurantoin Monohyd Macro] Other (See Comments)   FLU LIKE SYMPTONS   Penicillins Hives   Has patient had a PCN reaction causing immediate rash, facial/tongue/throat swelling, SOB or lightheadedness with hypotension: yes Has patient had a PCN reaction causing severe rash involving mucus membranes or skin necrosis: no Has patient had a PCN reaction that required hospitalization no Has patient had a PCN reaction occurring within the last 10 years: yes If  all of the above answers are "NO", then may proceed with Cephalosporin use.      Medication List    STOP taking these medications   calcium carbonate 500 MG chewable tablet Commonly known as:  TUMS - dosed in mg elemental calcium     TAKE these medications   butalbital-acetaminophen-caffeine 50-325-40 MG tablet Commonly known as:  FIORICET, ESGIC Take 2 tablets by mouth 3 (three) times daily as needed for headache.   hydrochlorothiazide 12.5 MG capsule Commonly known as:  MICROZIDE Take 1 capsule (12.5 mg total) by mouth daily.   HYDROcodone-acetaminophen 5-325 MG tablet Commonly known as:  NORCO/VICODIN Take 2 tablets by mouth every 6 (six) hours as needed for severe pain.   ibuprofen 600 MG tablet Commonly known as:  ADVIL,MOTRIN Take 1 tablet (600 mg total) by mouth every 6 (six) hours.   iron polysaccharides 150 MG capsule Commonly known as:  NIFEREX Take 1 capsule (150 mg total) by mouth daily.   magnesium oxide 400 (241.3 Mg) MG tablet Commonly known as:  MAG-OX Take 1 tablet (400 mg total) by mouth daily.   prenatal multivitamin Tabs tablet Take 1 tablet by mouth daily at 12 noon.   traMADol 50 MG tablet Commonly known as:  ULTRAM Take 1 tablet (50 mg total) by mouth every 6 (six) hours as needed for moderate pain.            Discharge Care Instructions        Start     Ordered   07/07/17 0000  Discharge wound care:    Comments:  Keep incision clean and dry - remove honeycomb dressing Saturday Tape strips remain in place x 2 weeks - dry with hairdryer after shower   07/07/17 1106      Diet: low salt diet  Activity: Advance as tolerated. Pelvic rest x 6 weeks.   Follow up:6 weeks at [facility] / Unversal home nurse visit for BP & edema recheck within the week    Signed: Artelia Laroche CNM, MSN, [facility] 07/07/2017, 11:06 AM

## 2017-07-07 NOTE — Plan of Care (Signed)
Problem: Activity: Goal: Ability to tolerate increased activity will improve Outcome: Completed/Met Date Met: 07/07/17 Pt ambulating easily and without assistance.  Increased ambulation encouraged.  Problem: Nutritional: Goal: Mothers verbalization of comfort with breastfeeding process will improve Outcome: Completed/Met Date Met: 07/07/17 RN informed by lactation that the pt requests formula for the baby with artificial nipple.  Pt reports she does not want to breastfeed at this time.  Pt appeared conflicted about no longer breastfeeding but reports latching is too difficult.  Explained that the pt could pump and bottle feed breastmilk if she desired.  Pt reported that she may choose to do that.  Discussed frequency of pumping.  Formula feeding and bottle feeding discussed and demonstrated.  Feeding amounts handout given.  Preparation and use of formula discussed.  Pt verbalized understanding.  Pt reports that she wants to take a break from pumping until she gets home, as she is hoping to be discharged today.  Will continue to monitor and assist as needing with feedings.

## 2017-07-07 NOTE — Lactation Note (Signed)
This note was copied from a baby's chart. Lactation Consultation Note  Patient Name: Boy Sacoya Mcgourty SVXBL'T Date: 07/07/2017 Reason for consult: Follow-up assessment  Follow up visit at 74 hours of age.  Mom is sleepy in bed and baby had last feeding a few hours ago.  Mom reports she is having difficulty with NS, baby latching, baby fussy and limited expression with pumping. LC discussed normal pumping for her first few days and need to stimulate breast 8x/day to establish a good milk supply.  Mom has personal DEBP for home use at bedside as well as DEBP set up for hospital use. Mom encouraged to post pump with use of NS to protect milk supply.  When asked how she wants to feed baby at home she reports she will keep trying, Lc encouraged mom to schedule o/p appointment for follow up.  Mom then reports she thinks she is done and will just give formula.  LC explained to mom [place] resources and made mom aware of printed instructions for engorgement if she decides she wants to breast feed more or if she is done. LC told patient her [facility] RN will provide her with formula to feed baby during her stay.  Mom reports baby's Dr. Has not made rounds today, but maybe discharging baby home today. Mom to call as needed.   LC reported to RN, Clark     Maternal Data    Feeding Feeding Type: Breast Milk Length of feed: 15 min  LATCH Score                   Interventions    Lactation Tools Discussed/Used     Consult Status Consult Status: Complete    Malena Edman 07/07/2017, 9:25 AM

## 2017-07-07 NOTE — Progress Notes (Signed)
POSTOPERATIVE DAY # 2 S/P CS   S:         Reports feeling miserable today with swelling in feet and abdominal soreness - wants to go home as cant sleep here             Tolerating po intake / no nausea / no vomiting / + flatus / no BM             Bleeding is light             Pain controlled with motrin and oxycodone - prefers something else that dose not make her sleepy             Up ad lib / ambulatory/ voiding QS  Newborn breast feeding   O:  VS: BP 130/82   Pulse (!) 108   Temp 98.8 F (37.1 C)   Resp 18   Ht 5\' 3"  (1.6 m)   Wt 104.8 kg (231 lb)   LMP 07/06/2016 (Exact Date)   SpO2 98%   Breastfeeding? Unknown   BMI 40.92 kg/m    LABS:               Recent Labs  07/05/17 1406 07/06/17 0538  WBC 21.7* 12.7*  HGB 11.3* 9.1*  PLT 196 174               Bloodtype: --/--/O POS (10/22 1743)  Rubella: Immune (10/22 1538)                                             I&O: Intake/Output      10/24 0701 - 10/25 0700 10/25 0701 - 10/26 0700   I.V. (mL/kg) 250 (2.4)    Total Intake(mL/kg) 250 (2.4)    Urine (mL/kg/hr) 1100 (0.4)    Total Output 1100     Net -850                     Physical Exam:             Alert and Oriented X3  Lungs: Clear and unlabored  Heart: regular rate and rhythm / no mumurs  Abdomen: soft, non-tender, non-distended, BS active             Fundus: firm, non-tender, Ueven             Dressing intact              Incision:  approximated with suture / no erythema / no ecchymosis / no drainage  Perineum: intact  Lochia: light  Extremities: 3+ edema, no calf pain or tenderness, negative Homans  A:        POD # 2 S/P CS            ABL anemia            Dependent edema  P:        Routine postoperative care              Start HCTZ             Continue iron and magnesium             Switch pain medicine to Tramadol             DC home - [facility] home visit Saturday - [facility] booklet - instructions reviewed  Artelia Laroche CNM, MSN,  [facility] 07/07/2017, 8:34 AM

## 2017-07-12 MED FILL — OXYCOD/ACETAMINOPHEN 5-325M: 5-325 | 2 days supply | Qty: 20 | Fill #0

## 2017-08-09 MED FILL — FLUCONAZOLE 150 MG TABLET: 150 | 5 days supply | Qty: 2 | Fill #0

## 2017-08-18 MED FILL — PHENTERMINE 37.5 MG TABLET: 37.5 | 30 days supply | Qty: 30 | Fill #0

## 2017-09-23 MED FILL — PHENTERMINE 37.5 MG TABLET: 37.5 | 30 days supply | Qty: 30 | Fill #1

## 2017-10-28 MED FILL — ALPRAZolam 0.5 MG TABS: 0.5 | 7 days supply | Qty: 20 | Fill #0

## 2017-11-07 DIAGNOSIS — Z3202 Encounter for pregnancy test, result negative: Secondary | ICD-10-CM | POA: Diagnosis not present

## 2017-11-15 ENCOUNTER — Ambulatory Visit (INDEPENDENT_AMBULATORY_CARE_PROVIDER_SITE_OTHER): Payer: Self-pay | Admitting: Emergency Medicine

## 2017-11-15 VITALS — BP 105/80 | HR 115 | Temp 98.8°F | Resp 18 | Wt 212.6 lb

## 2017-11-15 DIAGNOSIS — J101 Influenza due to other identified influenza virus with other respiratory manifestations: Secondary | ICD-10-CM

## 2017-11-15 DIAGNOSIS — R6889 Other general symptoms and signs: Secondary | ICD-10-CM

## 2017-11-15 MED ORDER — OSELTAMIVIR PHOSPHATE 75 MG PO CAPS: 75.0000 mg | ORAL_CAPSULE | Freq: Two times a day (BID) | ORAL | 0 refills | Status: DC

## 2017-11-15 MED ORDER — BALOXAVIR MARBOXIL(40 MG DOSE) 2 X 20 MG PO TBPK: 40.0000 mg | ORAL_TABLET | Freq: Once | ORAL | 0 refills | Status: DC

## 2017-11-15 MED ORDER — BENZONATATE 100 MG PO CAPS: 100.0000 mg | ORAL_CAPSULE | Freq: Three times a day (TID) | ORAL | 0 refills | Status: DC | PRN

## 2017-11-15 MED FILL — OSELTAMIVIR PHOSPHATE 75 MG: 75 | 5 days supply | Qty: 10 | Fill #0

## 2017-11-15 MED FILL — BENZONATATE 100 MG CAPS: 100 | 10 days supply | Qty: 30 | Fill #0

## 2017-11-15 NOTE — Progress Notes (Signed)
Subjective:     [name] is a 32 y.o. female who presents for evaluation of influenza like symptoms. Symptoms include chills, headache, myalgias, productive cough and fever and have been present for 1 day. She has tried to alleviate the symptoms with acetaminophen with minimal relief. High risk factors for influenza complications: none.     Review of Systems Pertinent items are noted in HPI.     Objective:    BP 105/80 (BP Location: Right Arm, Patient Position: Sitting, Cuff Size: Normal)   Pulse (!) 115   Temp 98.8 F (37.1 C) (Oral)   Resp 18   Wt 212 lb 9.6 oz (96.4 kg)   SpO2 97%   BMI 37.66 kg/m  General appearance: alert, cooperative and appears stated age Head: Normocephalic, without obvious abnormality, atraumatic Ears: normal TM's and external ear canals both ears Nose: Nares normal. Septum midline. Mucosa normal. No drainage or sinus tenderness. Throat: lips, mucosa, and tongue normal; teeth and gums normal Lungs: clear to auscultation bilaterally Heart: regular rate and rhythm Extremities: extremities normal, atraumatic, no cyanosis or edema Pulses: 2+ and symmetric Skin: Skin color, texture, turgor normal. No rashes or lesions    Assessment:    Influenza    Plan:    Supportive care with appropriate antipyretics and fluids. Educational material distributed and questions answered. Antivirals per orders. Follow up in 1 week or as needed. ER if worse

## 2017-11-15 NOTE — Addendum Note (Signed)
Addended by: Andres Shad on: 11/15/2017 05:19 PM   Modules accepted: Orders

## 2017-11-15 NOTE — Patient Instructions (Signed)

## 2017-11-18 ENCOUNTER — Telehealth: Payer: 59 | Admitting: Family

## 2017-11-18 DIAGNOSIS — J029 Acute pharyngitis, unspecified: Secondary | ICD-10-CM | POA: Diagnosis not present

## 2017-11-18 MED ORDER — ALBUTEROL SULFATE HFA 108 (90 BASE) MCG/ACT IN AERS
2.0000 | INHALATION_SPRAY | Freq: Four times a day (QID) | RESPIRATORY_TRACT | 2 refills | Status: DC | PRN
Start: 2017-11-18 — End: 2018-11-24

## 2017-11-18 MED ORDER — PREDNISONE 5 MG PO TABS: 5.0000 mg | ORAL_TABLET | Freq: Every day | ORAL | 0 refills | Status: DC

## 2017-11-18 MED FILL — predniSONE 5 MG TABS: 5 | 6 days supply | Qty: 21 | Fill #0

## 2017-11-18 MED FILL — VENTOLIN HFA 90 MCG INHALER: 108 (90 BAS | 25 days supply | Qty: 18 | Fill #0

## 2017-11-18 NOTE — Progress Notes (Signed)
Thank you for the details you included in the comment boxes. Those details are very helpful in determining the best course of treatment for you and help [place] to provide the best care. After the details you provided in our telephone call, see treatment plan below.  We are sorry that you are not feeling well.  Here is how we plan to help!  Based on your presentation I believe you most likely have A cough due to a virus.  This is called viral bronchitis and is best treated by rest, plenty of fluids and control of the cough.  You may use Ibuprofen or Tylenol as directed to help your symptoms.     In addition you may use A non-prescription cough medication called Mucinex DM: take 2 tablets every 12 hours.  Sterapred 5 mg dosepak (and continue using the benzonatate you already have)  From your responses in the eVisit questionnaire you describe inflammation in the upper respiratory tract which is causing a significant cough.  This is commonly called Bronchitis and has four common causes:    Allergies  Viral Infections  Acid Reflux  Bacterial Infection Allergies, viruses and acid reflux are treated by controlling symptoms or eliminating the cause. An example might be a cough caused by taking certain blood pressure medications. You stop the cough by changing the medication. Another example might be a cough caused by acid reflux. Controlling the reflux helps control the cough.  USE OF BRONCHODILATOR ("RESCUE") INHALERS: There is a risk from using your bronchodilator too frequently.  The risk is that over-reliance on a medication which only relaxes the muscles surrounding the breathing tubes can reduce the effectiveness of medications prescribed to reduce swelling and congestion of the tubes themselves.  Although you feel brief relief from the bronchodilator inhaler, your asthma may actually be worsening with the tubes becoming more swollen and filled with mucus.  This can delay other crucial treatments,  such as oral steroid medications. If you need to use a bronchodilator inhaler daily, several times per day, you should discuss this with your provider.  There are probably better treatments that could be used to keep your asthma under control.     HOME CARE . Only take medications as instructed by your medical team. . Complete the entire course of an antibiotic. . Drink plenty of fluids and get plenty of rest. . Avoid close contacts especially the very young and the elderly . Cover your mouth if you cough or cough into your sleeve. . Always remember to wash your hands . A steam or ultrasonic humidifier can help congestion.   GET HELP RIGHT AWAY IF: . You develop worsening fever. . You become short of breath . You cough up blood. . Your symptoms persist after you have completed your treatment plan MAKE SURE YOU   Understand these instructions.  Will watch your condition.  Will get help right away if you are not doing well or get worse.  Your e-visit answers were reviewed by a board certified advanced clinical practitioner to complete your personal care plan.  Depending on the condition, your plan could have included both over the counter or prescription medications. If there is a problem please reply  once you have received a response from your provider. Your safety is important to [place].  If you have drug allergies check your prescription carefully.    You can use MyChart to ask questions about today's visit, request a non-urgent call back, or ask for a work or  school excuse for 24 hours related to this e-Visit. If it has been greater than 24 hours you will need to follow up with your provider, or enter a new e-Visit to address those concerns. You will get an e-mail in the next two days asking about your experience.  I hope that your e-visit has been valuable and will speed your recovery. Thank you for using e-visits.

## 2017-11-24 ENCOUNTER — Other Ambulatory Visit: Payer: Self-pay

## 2017-11-24 DIAGNOSIS — D229 Melanocytic nevi, unspecified: Secondary | ICD-10-CM | POA: Diagnosis not present

## 2017-11-24 DIAGNOSIS — D485 Neoplasm of uncertain behavior of skin: Secondary | ICD-10-CM | POA: Diagnosis not present

## 2017-11-24 DIAGNOSIS — D1800 Hemangioma unspecified site: Secondary | ICD-10-CM | POA: Diagnosis not present

## 2017-12-06 DIAGNOSIS — L980 Pyogenic granuloma: Secondary | ICD-10-CM | POA: Diagnosis not present

## 2017-12-06 DIAGNOSIS — D485 Neoplasm of uncertain behavior of skin: Secondary | ICD-10-CM | POA: Diagnosis not present

## 2018-01-19 MED FILL — TRIAMCINOLONE 0.1% CREAM: 0.1 | 20 days supply | Qty: 45 | Fill #0

## 2018-01-19 MED FILL — NYSTATIN 100,000 UNIT/GM CR: 100000 | 20 days supply | Qty: 45 | Fill #0

## 2018-01-23 DIAGNOSIS — R5383 Other fatigue: Secondary | ICD-10-CM | POA: Diagnosis not present

## 2018-01-23 DIAGNOSIS — Z01419 Encounter for gynecological examination (general) (routine) without abnormal findings: Secondary | ICD-10-CM | POA: Diagnosis not present

## 2018-01-23 DIAGNOSIS — Z6835 Body mass index (BMI) 35.0-35.9, adult: Secondary | ICD-10-CM | POA: Diagnosis not present

## 2018-01-26 DIAGNOSIS — H52223 Regular astigmatism, bilateral: Secondary | ICD-10-CM | POA: Diagnosis not present

## 2018-06-12 DIAGNOSIS — K219 Gastro-esophageal reflux disease without esophagitis: Secondary | ICD-10-CM | POA: Diagnosis not present

## 2018-06-12 DIAGNOSIS — E039 Hypothyroidism, unspecified: Secondary | ICD-10-CM | POA: Diagnosis not present

## 2018-06-12 DIAGNOSIS — F419 Anxiety disorder, unspecified: Secondary | ICD-10-CM | POA: Diagnosis not present

## 2018-06-12 DIAGNOSIS — G43009 Migraine without aura, not intractable, without status migrainosus: Secondary | ICD-10-CM | POA: Diagnosis not present

## 2018-06-12 MED FILL — ALPRAZolam 0.25 MG TABS: 0.25 | 15 days supply | Qty: 15 | Fill #0

## 2018-08-01 DIAGNOSIS — H5213 Myopia, bilateral: Secondary | ICD-10-CM | POA: Diagnosis not present

## 2018-09-20 DIAGNOSIS — N6320 Unspecified lump in the left breast, unspecified quadrant: Secondary | ICD-10-CM | POA: Diagnosis not present

## 2018-09-21 MED FILL — ALPRAZolam 0.25 MG TABS: 0.25 | 15 days supply | Qty: 15 | Fill #1

## 2018-10-09 DIAGNOSIS — G43009 Migraine without aura, not intractable, without status migrainosus: Secondary | ICD-10-CM | POA: Diagnosis not present

## 2018-10-09 DIAGNOSIS — F419 Anxiety disorder, unspecified: Secondary | ICD-10-CM | POA: Diagnosis not present

## 2018-10-09 DIAGNOSIS — K219 Gastro-esophageal reflux disease without esophagitis: Secondary | ICD-10-CM | POA: Diagnosis not present

## 2018-10-09 DIAGNOSIS — E039 Hypothyroidism, unspecified: Secondary | ICD-10-CM | POA: Diagnosis not present

## 2018-10-09 DIAGNOSIS — N3010 Interstitial cystitis (chronic) without hematuria: Secondary | ICD-10-CM | POA: Diagnosis not present

## 2018-10-09 MED FILL — ALPRAZolam 0.5 MG TABS: 0.5 | 30 days supply | Qty: 30 | Fill #0

## 2018-10-11 MED FILL — RIZATRIPTAN BENZOATE 10 MG: 10 | 30 days supply | Qty: 10 | Fill #0

## 2018-10-12 DIAGNOSIS — D485 Neoplasm of uncertain behavior of skin: Secondary | ICD-10-CM | POA: Diagnosis not present

## 2018-10-12 DIAGNOSIS — L720 Epidermal cyst: Secondary | ICD-10-CM | POA: Diagnosis not present

## 2018-10-12 DIAGNOSIS — D239 Other benign neoplasm of skin, unspecified: Secondary | ICD-10-CM | POA: Diagnosis not present

## 2018-10-31 DIAGNOSIS — D225 Melanocytic nevi of trunk: Secondary | ICD-10-CM | POA: Diagnosis not present

## 2018-10-31 DIAGNOSIS — D485 Neoplasm of uncertain behavior of skin: Secondary | ICD-10-CM | POA: Diagnosis not present

## 2018-11-02 MED FILL — CLINDAMYCIN HCL 300 MG CAPS: 300 | 7 days supply | Qty: 21 | Fill #0

## 2018-11-06 MED FILL — RIZATRIPTAN BENZOATE 10 MG: 10 | 30 days supply | Qty: 10 | Fill #0

## 2018-11-14 DIAGNOSIS — D2271 Melanocytic nevi of right lower limb, including hip: Secondary | ICD-10-CM | POA: Diagnosis not present

## 2018-11-14 DIAGNOSIS — D485 Neoplasm of uncertain behavior of skin: Secondary | ICD-10-CM | POA: Diagnosis not present

## 2018-11-14 MED FILL — TRIAMCINOLONE 0.1% OINTMENT: 0.1 | 30 days supply | Qty: 80 | Fill #0

## 2018-11-24 ENCOUNTER — Telehealth: Payer: 59 | Admitting: Family

## 2018-11-24 ENCOUNTER — Telehealth: Payer: 59 | Admitting: Nurse Practitioner

## 2018-11-24 DIAGNOSIS — J029 Acute pharyngitis, unspecified: Secondary | ICD-10-CM

## 2018-11-24 DIAGNOSIS — J208 Acute bronchitis due to other specified organisms: Secondary | ICD-10-CM | POA: Diagnosis not present

## 2018-11-24 MED ORDER — ALBUTEROL SULFATE 108 (90 BASE) MCG/ACT IN AEPB
2.0000 | INHALATION_SPRAY | Freq: Four times a day (QID) | RESPIRATORY_TRACT | 2 refills | Status: DC | PRN
Start: 2018-11-24 — End: 2018-11-24

## 2018-11-24 MED ORDER — PREDNISONE 5 MG PO TABS: 5.0000 mg | ORAL_TABLET | Freq: Every day | ORAL | 0 refills | Status: DC

## 2018-11-24 MED ORDER — ALBUTEROL SULFATE 108 (90 BASE) MCG/ACT IN AEPB: 2.0000 | INHALATION_SPRAY | Freq: Four times a day (QID) | RESPIRATORY_TRACT | 0 refills | Status: DC | PRN

## 2018-11-24 MED ORDER — BENZONATATE 100 MG PO CAPS: 100.0000 mg | ORAL_CAPSULE | Freq: Three times a day (TID) | ORAL | 0 refills | Status: DC | PRN

## 2018-11-24 MED FILL — predniSONE 5 MG TABS: 5 | 6 days supply | Qty: 21 | Fill #0

## 2018-11-24 MED FILL — PROAIR HFA 90 MCG INHALER: 108 (90 BAS | 25 days supply | Qty: 9 | Fill #0

## 2018-11-24 NOTE — Addendum Note (Signed)
Addended by: Chevis Pretty on: 11/24/2018 04:36 PM   Modules accepted: Orders

## 2018-11-24 NOTE — Addendum Note (Signed)
Addended by: Chevis Pretty on: 11/24/2018 04:59 PM   Modules accepted: Orders

## 2018-11-24 NOTE — Progress Notes (Signed)
Greater than 5 minutes, yet less than 10 minutes of time have been spent researching, coordinating, and implementing care for this patient today.  Thank you for the details you included in the comment boxes. Those details are very helpful in determining the best course of treatment for you and help [place] to provide the best care.   We rarely see shortness of breath with typical allergies without several other symptoms. This could be the beginning of a viral respiratory infection. As you did not mention any other symptoms, I am not suspicious about possible Coronavirus or something serious at this time. However, this is likely a viral infection that is common, that should be watched and treated with supportive care.   We are sorry that you are not feeling well.  Here is how we plan to help!  Based on your presentation I believe you most likely have A cough due to a virus.  This is called viral bronchitis and is best treated by rest, plenty of fluids and control of the cough.  You may use Ibuprofen or Tylenol as directed to help your symptoms.     In addition you may use A non-prescription cough medication called Mucinex DM: take 2 tablets every 12 hours. and A prescription cough medication called Tessalon Perles 100mg . You may take 1-2 capsules every 8 hours as needed for your cough.  I have also added an Albuterol inhaler, take 2 puffs every 6 hours as needed for shortness of breath.    Prednisone 5 mg daily for 6 days (see taper instructions below)  Directions for 6 day taper: Day 1: 2 tablets before breakfast, 1 after both lunch & dinner and 2 at bedtime Day 2: 1 tab before breakfast, 1 after both lunch & dinner and 2 at bedtime Day 3: 1 tab at each meal & 1 at bedtime Day 4: 1 tab at breakfast, 1 at lunch, 1 at bedtime Day 5: 1 tab at breakfast & 1 tab at bedtime Day 6: 1 tab at breakfast   From your responses in the eVisit questionnaire you describe inflammation in the upper respiratory  tract which is causing a significant cough.  This is commonly called Bronchitis and has four common causes:    Allergies  Viral Infections  Acid Reflux  Bacterial Infection Allergies, viruses and acid reflux are treated by controlling symptoms or eliminating the cause. An example might be a cough caused by taking certain blood pressure medications. You stop the cough by changing the medication. Another example might be a cough caused by acid reflux. Controlling the reflux helps control the cough.  USE OF BRONCHODILATOR ("RESCUE") INHALERS: There is a risk from using your bronchodilator too frequently.  The risk is that over-reliance on a medication which only relaxes the muscles surrounding the breathing tubes can reduce the effectiveness of medications prescribed to reduce swelling and congestion of the tubes themselves.  Although you feel brief relief from the bronchodilator inhaler, your asthma may actually be worsening with the tubes becoming more swollen and filled with mucus.  This can delay other crucial treatments, such as oral steroid medications. If you need to use a bronchodilator inhaler daily, several times per day, you should discuss this with your provider.  There are probably better treatments that could be used to keep your asthma under control.     HOME CARE . Only take medications as instructed by your medical team. . Complete the entire course of an antibiotic. . Drink plenty of fluids  and get plenty of rest. . Avoid close contacts especially the very young and the elderly . Cover your mouth if you cough or cough into your sleeve. . Always remember to wash your hands . A steam or ultrasonic humidifier can help congestion.   GET HELP RIGHT AWAY IF: . You develop worsening fever. . You become short of breath . You cough up blood. . Your symptoms persist after you have completed your treatment plan MAKE SURE YOU   Understand these instructions.  Will watch your  condition.  Will get help right away if you are not doing well or get worse.  Your e-visit answers were reviewed by a board certified advanced clinical practitioner to complete your personal care plan.  Depending on the condition, your plan could have included both over the counter or prescription medications. If there is a problem please reply  once you have received a response from your provider. Your safety is important to [place].  If you have drug allergies check your prescription carefully.    You can use MyChart to ask questions about today's visit, request a non-urgent call back, or ask for a work or school excuse for 24 hours related to this e-Visit. If it has been greater than 24 hours you will need to follow up with your provider, or enter a new e-Visit to address those concerns. You will get an e-mail in the next two days asking about your experience.  I hope that your e-visit has been valuable and will speed your recovery. Thank you for using e-visits. Marland Kitchen

## 2018-11-24 NOTE — Progress Notes (Signed)
Patient submitted 2 e visits- spoke with her on phone and corrected issue

## 2018-12-05 MED FILL — ALPRAZolam 0.5 MG TABS: 0.5 | 30 days supply | Qty: 30 | Fill #1 | Status: TO

## 2018-12-06 MED FILL — RIZATRIPTAN BENZOATE 10 MG: 10 | 30 days supply | Qty: 10 | Fill #0

## 2019-01-03 MED FILL — ALPRAZolam 0.5 MG TABS: 0.5 | 30 days supply | Qty: 30 | Fill #0

## 2019-01-03 MED FILL — RIZATRIPTAN BENZOATE 10 MG: 10 | 30 days supply | Qty: 10 | Fill #1

## 2019-02-02 MED FILL — FOLIC ACID 1 MG TABS: 1 | 30 days supply | Qty: 120 | Fill #0

## 2019-02-07 MED FILL — RIZATRIPTAN BENZOATE 10 MG: 10 | 17 days supply | Qty: 10 | Fill #0

## 2019-03-01 DIAGNOSIS — Z1329 Encounter for screening for other suspected endocrine disorder: Secondary | ICD-10-CM | POA: Diagnosis not present

## 2019-03-01 DIAGNOSIS — E039 Hypothyroidism, unspecified: Secondary | ICD-10-CM | POA: Diagnosis not present

## 2019-03-01 DIAGNOSIS — Z6834 Body mass index (BMI) 34.0-34.9, adult: Secondary | ICD-10-CM | POA: Diagnosis not present

## 2019-03-01 DIAGNOSIS — Z130 Encounter for screening for diseases of the blood and blood-forming organs and certain disorders involving the immune mechanism: Secondary | ICD-10-CM | POA: Diagnosis not present

## 2019-03-01 DIAGNOSIS — Z0000 Encounter for general adult medical examination without abnormal findings: Secondary | ICD-10-CM | POA: Diagnosis not present

## 2019-03-01 DIAGNOSIS — Z13220 Encounter for screening for lipoid disorders: Secondary | ICD-10-CM | POA: Diagnosis not present

## 2019-03-01 DIAGNOSIS — Z01419 Encounter for gynecological examination (general) (routine) without abnormal findings: Secondary | ICD-10-CM | POA: Diagnosis not present

## 2019-03-01 DIAGNOSIS — Z1151 Encounter for screening for human papillomavirus (HPV): Secondary | ICD-10-CM | POA: Diagnosis not present

## 2019-03-01 MED FILL — VIRT-C DHA SOFTGEL: 53.5-38-1 | 90 days supply | Qty: 90 | Fill #0

## 2019-03-06 MED FILL — LEVOTHYROXINE 25 MCG TABLET: 25 | 60 days supply | Qty: 60 | Fill #0

## 2019-03-06 MED FILL — RIZATRIPTAN BENZOATE 10 MG: 10 | 30 days supply | Qty: 10 | Fill #0

## 2019-03-20 DIAGNOSIS — Z3A01 Less than 8 weeks gestation of pregnancy: Secondary | ICD-10-CM | POA: Diagnosis not present

## 2019-03-20 DIAGNOSIS — O09291 Supervision of pregnancy with other poor reproductive or obstetric history, first trimester: Secondary | ICD-10-CM | POA: Diagnosis not present

## 2019-03-20 DIAGNOSIS — Z3200 Encounter for pregnancy test, result unknown: Secondary | ICD-10-CM | POA: Diagnosis not present

## 2019-03-20 DIAGNOSIS — Z3689 Encounter for other specified antenatal screening: Secondary | ICD-10-CM | POA: Diagnosis not present

## 2019-03-21 MED FILL — PROGESTERONE MICRONIZED 200: 200 | 30 days supply | Qty: 30 | Fill #0

## 2019-03-22 DIAGNOSIS — O09291 Supervision of pregnancy with other poor reproductive or obstetric history, first trimester: Secondary | ICD-10-CM | POA: Diagnosis not present

## 2019-03-22 DIAGNOSIS — Z3A01 Less than 8 weeks gestation of pregnancy: Secondary | ICD-10-CM | POA: Diagnosis not present

## 2019-03-26 DIAGNOSIS — O09291 Supervision of pregnancy with other poor reproductive or obstetric history, first trimester: Secondary | ICD-10-CM | POA: Diagnosis not present

## 2019-03-26 DIAGNOSIS — Z3A01 Less than 8 weeks gestation of pregnancy: Secondary | ICD-10-CM | POA: Diagnosis not present

## 2019-03-26 MED FILL — BUTALB-ACETAMIN-CAFF 50-325: 50-325-40 | 3 days supply | Qty: 30 | Fill #0

## 2019-04-05 DIAGNOSIS — Z3201 Encounter for pregnancy test, result positive: Secondary | ICD-10-CM | POA: Diagnosis not present

## 2019-04-05 MED FILL — busPIRone HCL 5 MG TABS: 5 | 30 days supply | Qty: 60 | Fill #0

## 2019-04-13 DIAGNOSIS — E039 Hypothyroidism, unspecified: Secondary | ICD-10-CM | POA: Diagnosis not present

## 2019-04-13 DIAGNOSIS — Z3201 Encounter for pregnancy test, result positive: Secondary | ICD-10-CM | POA: Diagnosis not present

## 2019-04-13 DIAGNOSIS — R300 Dysuria: Secondary | ICD-10-CM | POA: Diagnosis not present

## 2019-04-13 DIAGNOSIS — N760 Acute vaginitis: Secondary | ICD-10-CM | POA: Diagnosis not present

## 2019-04-13 MED FILL — CLINDAMYCIN HCL 300 MG CAPS: 300 | 7 days supply | Qty: 14 | Fill #0

## 2019-04-13 MED FILL — FOLIC ACID 1 MG TABS: 1 | 30 days supply | Qty: 120 | Fill #0

## 2019-04-16 MED FILL — PROGESTERONE MICRONIZED 200: 200 | 30 days supply | Qty: 30 | Fill #1

## 2019-04-16 MED FILL — LEVOTHYROXINE 50 MCG TABLET: 50 | 30 days supply | Qty: 30 | Fill #0

## 2019-04-18 MED FILL — BUTALB-ACETAMIN-CAFF 50-325: 50-325-40 | 5 days supply | Qty: 30 | Fill #0

## 2019-04-23 DIAGNOSIS — Z3481 Encounter for supervision of other normal pregnancy, first trimester: Secondary | ICD-10-CM | POA: Diagnosis not present

## 2019-04-23 DIAGNOSIS — Z3689 Encounter for other specified antenatal screening: Secondary | ICD-10-CM | POA: Diagnosis not present

## 2019-04-23 LAB — OB RESULTS CONSOLE GC/CHLAMYDIA
Chlamydia: NEGATIVE
Gonorrhea: NEGATIVE

## 2019-04-23 LAB — OB RESULTS CONSOLE ANTIBODY SCREEN: Antibody Screen: NEGATIVE

## 2019-04-23 LAB — OB RESULTS CONSOLE RPR: RPR: NONREACTIVE

## 2019-04-23 LAB — OB RESULTS CONSOLE RUBELLA ANTIBODY, IGM: Rubella: IMMUNE

## 2019-04-23 LAB — OB RESULTS CONSOLE HIV ANTIBODY (ROUTINE TESTING): HIV: NONREACTIVE

## 2019-04-23 LAB — OB RESULTS CONSOLE ABO/RH: RH Type: POSITIVE

## 2019-04-23 LAB — OB RESULTS CONSOLE HEPATITIS B SURFACE ANTIGEN: Hepatitis B Surface Ag: NEGATIVE

## 2019-04-23 MED FILL — HYDROCODON-APAP 5-325: 5-325 | 2 days supply | Qty: 10 | Fill #0

## 2019-05-03 DIAGNOSIS — Z3A10 10 weeks gestation of pregnancy: Secondary | ICD-10-CM | POA: Diagnosis not present

## 2019-05-03 DIAGNOSIS — Z118 Encounter for screening for other infectious and parasitic diseases: Secondary | ICD-10-CM | POA: Diagnosis not present

## 2019-05-03 DIAGNOSIS — N898 Other specified noninflammatory disorders of vagina: Secondary | ICD-10-CM | POA: Diagnosis not present

## 2019-05-03 DIAGNOSIS — O2620 Pregnancy care for patient with recurrent pregnancy loss, unspecified trimester: Secondary | ICD-10-CM | POA: Diagnosis not present

## 2019-05-03 DIAGNOSIS — O09291 Supervision of pregnancy with other poor reproductive or obstetric history, first trimester: Secondary | ICD-10-CM | POA: Diagnosis not present

## 2019-05-15 MED FILL — FOLIC ACID 1 MG TABS: 1 | 30 days supply | Qty: 120 | Fill #1

## 2019-05-15 MED FILL — VIRT-C DHA SOFTGEL: 53.5-38-1 | 90 days supply | Qty: 90 | Fill #1

## 2019-05-18 MED FILL — BUTALB-ACETAMIN-CAFF 50-325: 50-325-40 | 5 days supply | Qty: 30 | Fill #0

## 2019-05-23 DIAGNOSIS — N760 Acute vaginitis: Secondary | ICD-10-CM | POA: Diagnosis not present

## 2019-05-23 DIAGNOSIS — O99281 Endocrine, nutritional and metabolic diseases complicating pregnancy, first trimester: Secondary | ICD-10-CM | POA: Diagnosis not present

## 2019-05-23 DIAGNOSIS — Z3A13 13 weeks gestation of pregnancy: Secondary | ICD-10-CM | POA: Diagnosis not present

## 2019-05-23 DIAGNOSIS — E039 Hypothyroidism, unspecified: Secondary | ICD-10-CM | POA: Diagnosis not present

## 2019-05-23 DIAGNOSIS — R300 Dysuria: Secondary | ICD-10-CM | POA: Diagnosis not present

## 2019-05-23 MED FILL — metroNIDAZOLE 0.75 % GEL: 0.75 | 5 days supply | Qty: 70 | Fill #0

## 2019-05-25 MED FILL — SUMAtriptan SUCCINATE 50 MG: 50 | 5 days supply | Qty: 2 | Fill #0

## 2019-05-25 MED FILL — LEVOTHYROXINE 50 MCG TABLET: 50 | 30 days supply | Qty: 30 | Fill #1

## 2019-06-08 MED FILL — BUTALB-ACETAMIN-CAFF 50-325: 50-325-40 | 5 days supply | Qty: 30 | Fill #0

## 2019-06-12 DIAGNOSIS — Z361 Encounter for antenatal screening for raised alphafetoprotein level: Secondary | ICD-10-CM | POA: Diagnosis not present

## 2019-06-13 MED FILL — busPIRone HCL 10 MG TABS: 10 | 30 days supply | Qty: 60 | Fill #0

## 2019-06-20 MED FILL — LEVOTHYROXINE 50 MCG TABLET: 50 | 30 days supply | Qty: 30 | Fill #0

## 2019-07-02 DIAGNOSIS — Z363 Encounter for antenatal screening for malformations: Secondary | ICD-10-CM | POA: Diagnosis not present

## 2019-07-02 DIAGNOSIS — E039 Hypothyroidism, unspecified: Secondary | ICD-10-CM | POA: Diagnosis not present

## 2019-07-19 MED FILL — LEVOTHYROXINE 50 MCG TABLET: 50 | 30 days supply | Qty: 30 | Fill #0

## 2019-07-20 MED FILL — BUTALB-ACETAMIN-CAFF 50-325: 50-325-40 | 5 days supply | Qty: 30 | Fill #0

## 2019-08-14 MED FILL — VIRT-C DHA SOFTGEL: 53.5-38-1 | 90 days supply | Qty: 90 | Fill #0

## 2019-08-15 MED FILL — LEVOTHYROXINE 50 MCG TABLET: 50 | 30 days supply | Qty: 30 | Fill #0

## 2019-08-15 MED FILL — LEVOTHYROXINE 50 MCG TABLET: 50 | 30 days supply | Qty: 30 | Fill #0 | Status: TO

## 2019-08-30 DIAGNOSIS — Z3A27 27 weeks gestation of pregnancy: Secondary | ICD-10-CM | POA: Diagnosis not present

## 2019-08-30 DIAGNOSIS — Z3689 Encounter for other specified antenatal screening: Secondary | ICD-10-CM | POA: Diagnosis not present

## 2019-08-30 DIAGNOSIS — O4402 Placenta previa specified as without hemorrhage, second trimester: Secondary | ICD-10-CM | POA: Diagnosis not present

## 2019-08-30 MED FILL — metroNIDAZOLE 0.75 % GEL: 0.75 | 5 days supply | Qty: 70 | Fill #0

## 2019-08-31 MED FILL — BUTALB-ACETAMIN-CAFF 50-325: 50-325-40 | 3 days supply | Qty: 30 | Fill #0

## 2019-09-04 DIAGNOSIS — F411 Generalized anxiety disorder: Secondary | ICD-10-CM | POA: Diagnosis not present

## 2019-09-18 DIAGNOSIS — Z3689 Encounter for other specified antenatal screening: Secondary | ICD-10-CM | POA: Diagnosis not present

## 2019-09-18 DIAGNOSIS — Z23 Encounter for immunization: Secondary | ICD-10-CM | POA: Diagnosis not present

## 2019-09-18 DIAGNOSIS — O99280 Endocrine, nutritional and metabolic diseases complicating pregnancy, unspecified trimester: Secondary | ICD-10-CM | POA: Diagnosis not present

## 2019-09-20 MED FILL — LEVOTHYROXINE 50 MCG TABLET: 50 | 30 days supply | Qty: 30 | Fill #0

## 2019-10-02 DIAGNOSIS — Z3A32 32 weeks gestation of pregnancy: Secondary | ICD-10-CM | POA: Diagnosis not present

## 2019-10-02 DIAGNOSIS — O99283 Endocrine, nutritional and metabolic diseases complicating pregnancy, third trimester: Secondary | ICD-10-CM | POA: Diagnosis not present

## 2019-10-02 MED FILL — HYDROCORTISONE 2.5% CREAM: 2.5 | 30 days supply | Qty: 30 | Fill #0

## 2019-10-16 DIAGNOSIS — O3663X0 Maternal care for excessive fetal growth, third trimester, not applicable or unspecified: Secondary | ICD-10-CM | POA: Diagnosis not present

## 2019-10-16 DIAGNOSIS — Z3A34 34 weeks gestation of pregnancy: Secondary | ICD-10-CM | POA: Diagnosis not present

## 2019-10-20 MED FILL — LEVOTHYROXINE 50 MCG TABLET: 50 | 30 days supply | Qty: 30 | Fill #0

## 2019-10-23 DIAGNOSIS — Z3685 Encounter for antenatal screening for Streptococcus B: Secondary | ICD-10-CM | POA: Diagnosis not present

## 2019-10-23 LAB — OB RESULTS CONSOLE GBS: GBS: POSITIVE

## 2019-10-25 ENCOUNTER — Other Ambulatory Visit: Payer: Self-pay | Admitting: Obstetrics and Gynecology

## 2019-11-06 DIAGNOSIS — O3663X0 Maternal care for excessive fetal growth, third trimester, not applicable or unspecified: Secondary | ICD-10-CM | POA: Diagnosis not present

## 2019-11-06 DIAGNOSIS — Z3A37 37 weeks gestation of pregnancy: Secondary | ICD-10-CM | POA: Diagnosis not present

## 2019-11-08 ENCOUNTER — Encounter (HOSPITAL_COMMUNITY): Payer: Self-pay | Admitting: *Deleted

## 2019-11-08 NOTE — Patient Instructions (Addendum)
SECILIA MURRILL  11/08/2019   Your procedure is scheduled on:  11/21/2019  Arrive at 1:15PM at Entrance C on [address] at [facility]  and [employer] [employer] [address]. You are invited to use the FREE valet parking or use the Visitor's parking deck.  Pick up the phone at the desk and dial [phone].  Call this number if you have problems the morning of surgery: [phone]  Remember:   Do not eat food:(After Midnight) Desps de medianoche.  Do not drink clear liquids: (6 Hours before arrival) 6 horas ante llegada.  Take these medicines the morning of surgery with A SIP OF WATER:  Synthroid and buspar   Do not wear jewelry, make-up or nail polish.  Do not wear lotions, powders, or perfumes. Do not wear deodorant.  Do not shave 48 hours prior to surgery.  Do not bring valuables to the hospital.  [facility] is not   responsible for any belongings or valuables brought to the hospital.  Contacts, dentures or bridgework may not be worn into surgery.  Leave suitcase in the car. After surgery it may be brought to your room.  For patients admitted to the hospital, checkout time is 11:00 AM the day of              discharge.      Please read over the following fact sheets that you were given:     Preparing for Surgery

## 2019-11-09 ENCOUNTER — Telehealth (HOSPITAL_COMMUNITY): Payer: Self-pay | Admitting: *Deleted

## 2019-11-09 NOTE — Telephone Encounter (Signed)
Preadmission screen  

## 2019-11-12 ENCOUNTER — Encounter (HOSPITAL_COMMUNITY): Payer: Self-pay

## 2019-11-13 DIAGNOSIS — Z3A38 38 weeks gestation of pregnancy: Secondary | ICD-10-CM | POA: Diagnosis not present

## 2019-11-13 DIAGNOSIS — O133 Gestational [pregnancy-induced] hypertension without significant proteinuria, third trimester: Secondary | ICD-10-CM | POA: Diagnosis not present

## 2019-11-13 MED FILL — BUTALB-ACETAMIN-CAFF 50-325: 50-325-40 | 5 days supply | Qty: 30 | Fill #0

## 2019-11-19 ENCOUNTER — Other Ambulatory Visit (HOSPITAL_COMMUNITY): Admission: RE | Admit: 2019-11-19 | Payer: 59 | Source: Ambulatory Visit

## 2019-11-20 ENCOUNTER — Other Ambulatory Visit (HOSPITAL_COMMUNITY)
Admission: RE | Admit: 2019-11-20 | Discharge: 2019-11-20 | Disposition: A | Discharge status: Home / Self Care | Payer: 59 | Source: Ambulatory Visit | Attending: Obstetrics and Gynecology | Admitting: Obstetrics and Gynecology

## 2019-11-20 ENCOUNTER — Other Ambulatory Visit: Payer: Self-pay

## 2019-11-20 DIAGNOSIS — D62 Acute posthemorrhagic anemia: Secondary | ICD-10-CM | POA: Diagnosis not present

## 2019-11-20 DIAGNOSIS — E039 Hypothyroidism, unspecified: Secondary | ICD-10-CM | POA: Diagnosis not present

## 2019-11-20 DIAGNOSIS — O9081 Anemia of the puerperium: Secondary | ICD-10-CM | POA: Diagnosis not present

## 2019-11-20 DIAGNOSIS — O134 Gestational [pregnancy-induced] hypertension without significant proteinuria, complicating childbirth: Secondary | ICD-10-CM | POA: Diagnosis not present

## 2019-11-20 DIAGNOSIS — O99824 Streptococcus B carrier state complicating childbirth: Secondary | ICD-10-CM | POA: Diagnosis not present

## 2019-11-20 DIAGNOSIS — Z20822 Contact with and (suspected) exposure to covid-19: Secondary | ICD-10-CM | POA: Diagnosis not present

## 2019-11-20 DIAGNOSIS — O99284 Endocrine, nutritional and metabolic diseases complicating childbirth: Secondary | ICD-10-CM | POA: Diagnosis not present

## 2019-11-20 DIAGNOSIS — Z302 Encounter for sterilization: Secondary | ICD-10-CM | POA: Diagnosis not present

## 2019-11-20 DIAGNOSIS — O34211 Maternal care for low transverse scar from previous cesarean delivery: Secondary | ICD-10-CM | POA: Diagnosis not present

## 2019-11-20 HISTORY — DX: Personal history of other complications of pregnancy, childbirth and the puerperium: Z87.59

## 2019-11-20 HISTORY — DX: Other complications of anesthesia, initial encounter: T88.59XA

## 2019-11-20 LAB — CBC
HCT: 38.8 % (ref 36.0–46.0)
Hemoglobin: 13.2 g/dL (ref 12.0–15.0)
MCH: 29.9 pg (ref 26.0–34.0)
MCHC: 34 g/dL (ref 30.0–36.0)
MCV: 88 fL (ref 80.0–100.0)
Platelets: 267 10*3/uL (ref 150–400)
RBC: 4.41 MIL/uL (ref 3.87–5.11)
RDW: 13.9 % (ref 11.5–15.5)
WBC: 9.3 10*3/uL (ref 4.0–10.5)
nRBC: 0 % (ref 0.0–0.2)

## 2019-11-20 LAB — RPR: RPR Ser Ql: NONREACTIVE

## 2019-11-20 LAB — SARS CORONAVIRUS 2 (TAT 6-24 HRS): SARS Coronavirus 2: NEGATIVE

## 2019-11-20 LAB — TYPE AND SCREEN
ABO/RH(D): O POS
Antibody Screen: NEGATIVE

## 2019-11-20 LAB — ABO/RH: ABO/RH(D): O POS

## 2019-11-20 MED FILL — LEVOTHYROXINE 50 MCG TABLET: 50 | 30 days supply | Qty: 30 | Fill #1

## 2019-11-21 ENCOUNTER — Inpatient Hospital Stay (HOSPITAL_COMMUNITY)
Admission: RE | Admit: 2019-11-21 | Discharge: 2019-11-22 | DRG: 784 | Disposition: A | Discharge status: Home / Self Care | Payer: 59 | Attending: Obstetrics and Gynecology | Admitting: Obstetrics and Gynecology

## 2019-11-21 ENCOUNTER — Inpatient Hospital Stay (HOSPITAL_COMMUNITY): Payer: 59

## 2019-11-21 ENCOUNTER — Encounter (HOSPITAL_COMMUNITY): Payer: Self-pay | Admitting: Obstetrics and Gynecology

## 2019-11-21 ENCOUNTER — Other Ambulatory Visit: Payer: Self-pay

## 2019-11-21 ENCOUNTER — Encounter (HOSPITAL_COMMUNITY)
Admission: RE | Disposition: A | Discharge status: Home / Self Care | Payer: Self-pay | Source: Home / Self Care | Attending: Obstetrics and Gynecology

## 2019-11-21 DIAGNOSIS — O99824 Streptococcus B carrier state complicating childbirth: Secondary | ICD-10-CM | POA: Diagnosis present

## 2019-11-21 DIAGNOSIS — Z20822 Contact with and (suspected) exposure to covid-19: Secondary | ICD-10-CM | POA: Diagnosis present

## 2019-11-21 DIAGNOSIS — Z87891 Personal history of nicotine dependence: Secondary | ICD-10-CM | POA: Diagnosis not present

## 2019-11-21 DIAGNOSIS — O34211 Maternal care for low transverse scar from previous cesarean delivery: Principal | ICD-10-CM | POA: Diagnosis present

## 2019-11-21 DIAGNOSIS — O9081 Anemia of the puerperium: Secondary | ICD-10-CM | POA: Diagnosis not present

## 2019-11-21 DIAGNOSIS — O99284 Endocrine, nutritional and metabolic diseases complicating childbirth: Secondary | ICD-10-CM | POA: Diagnosis not present

## 2019-11-21 DIAGNOSIS — O134 Gestational [pregnancy-induced] hypertension without significant proteinuria, complicating childbirth: Secondary | ICD-10-CM | POA: Diagnosis present

## 2019-11-21 DIAGNOSIS — O34218 Maternal care for other type scar from previous cesarean delivery: Secondary | ICD-10-CM | POA: Diagnosis not present

## 2019-11-21 DIAGNOSIS — E039 Hypothyroidism, unspecified: Secondary | ICD-10-CM | POA: Diagnosis present

## 2019-11-21 DIAGNOSIS — Z3A39 39 weeks gestation of pregnancy: Secondary | ICD-10-CM

## 2019-11-21 DIAGNOSIS — Z98891 History of uterine scar from previous surgery: Secondary | ICD-10-CM

## 2019-11-21 DIAGNOSIS — D62 Acute posthemorrhagic anemia: Secondary | ICD-10-CM | POA: Diagnosis not present

## 2019-11-21 DIAGNOSIS — O99214 Obesity complicating childbirth: Secondary | ICD-10-CM | POA: Diagnosis present

## 2019-11-21 DIAGNOSIS — Z880 Allergy status to penicillin: Secondary | ICD-10-CM | POA: Diagnosis not present

## 2019-11-21 DIAGNOSIS — E669 Obesity, unspecified: Secondary | ICD-10-CM | POA: Diagnosis present

## 2019-11-21 DIAGNOSIS — Z302 Encounter for sterilization: Secondary | ICD-10-CM | POA: Diagnosis not present

## 2019-11-21 DIAGNOSIS — O34219 Maternal care for unspecified type scar from previous cesarean delivery: Secondary | ICD-10-CM | POA: Diagnosis present

## 2019-11-21 SURGERY — Surgical Case
Anesthesia: Spinal

## 2019-11-21 MED ORDER — BUPIVACAINE HCL (PF) 0.25 % IJ SOLN
INTRAMUSCULAR | Status: DC | PRN
  Administered 2019-11-21: 20 mL

## 2019-11-21 MED ORDER — SIMETHICONE 80 MG PO CHEW
80.0000 mg | CHEWABLE_TABLET | ORAL | Status: DC
  Administered 2019-11-21: 80 mg via ORAL
  Filled 2019-11-21: qty 1

## 2019-11-21 MED ORDER — DIPHENHYDRAMINE HCL 25 MG PO CAPS: 25.0000 mg | ORAL_CAPSULE | ORAL | Status: DC | PRN

## 2019-11-21 MED ORDER — COCONUT OIL OIL: 1.0000 "application " | TOPICAL_OIL | Status: DC | PRN

## 2019-11-21 MED ORDER — LEVOTHYROXINE SODIUM 50 MCG PO TABS
50.0000 ug | ORAL_TABLET | Freq: Every day | ORAL | Status: DC
  Administered 2019-11-22: 50 ug via ORAL
  Filled 2019-11-21: qty 1

## 2019-11-21 MED ORDER — SIMETHICONE 80 MG PO CHEW: 80.0000 mg | CHEWABLE_TABLET | ORAL | Status: DC | PRN

## 2019-11-21 MED ORDER — CEFAZOLIN SODIUM-DEXTROSE 2-3 GM-%(50ML) IV SOLR
INTRAVENOUS | Status: DC | PRN
  Administered 2019-11-21: 2 g via INTRAVENOUS

## 2019-11-21 MED ORDER — BUPIVACAINE IN DEXTROSE 0.75-8.25 % IT SOLN
INTRATHECAL | Status: DC | PRN
  Administered 2019-11-21: 1.5 mL via INTRATHECAL

## 2019-11-21 MED ORDER — KETOROLAC TROMETHAMINE 30 MG/ML IJ SOLN
INTRAMUSCULAR | Status: AC
  Filled 2019-11-21: qty 1

## 2019-11-21 MED ORDER — SCOPOLAMINE 1 MG/3DAYS TD PT72
MEDICATED_PATCH | TRANSDERMAL | Status: AC
  Filled 2019-11-21: qty 1

## 2019-11-21 MED ORDER — KETOROLAC TROMETHAMINE 30 MG/ML IJ SOLN: 30.0000 mg | Freq: Four times a day (QID) | INTRAMUSCULAR | Status: AC | PRN

## 2019-11-21 MED ORDER — PHENYLEPHRINE HCL (PRESSORS) 10 MG/ML IV SOLN
INTRAVENOUS | Status: DC | PRN
  Administered 2019-11-21: 120 ug via INTRAVENOUS
  Administered 2019-11-21: 80 ug via INTRAVENOUS

## 2019-11-21 MED ORDER — NALBUPHINE HCL 10 MG/ML IJ SOLN: 5.0000 mg | Freq: Once | INTRAMUSCULAR | Status: DC | PRN

## 2019-11-21 MED ORDER — LACTATED RINGERS IV SOLN: INTRAVENOUS | Status: DC | PRN

## 2019-11-21 MED ORDER — OXYTOCIN 10 UNIT/ML IJ SOLN
INTRAMUSCULAR | Status: DC | PRN
  Administered 2019-11-21: 40 [IU]

## 2019-11-21 MED ORDER — DIPHENHYDRAMINE HCL 25 MG PO CAPS: 25.0000 mg | ORAL_CAPSULE | Freq: Four times a day (QID) | ORAL | Status: DC | PRN

## 2019-11-21 MED ORDER — NALBUPHINE HCL 10 MG/ML IJ SOLN: 5.0000 mg | INTRAMUSCULAR | Status: DC | PRN

## 2019-11-21 MED ORDER — METOCLOPRAMIDE HCL 5 MG/ML IJ SOLN
INTRAMUSCULAR | Status: DC | PRN
  Administered 2019-11-21: 10 mg via INTRAVENOUS

## 2019-11-21 MED ORDER — DIPHENHYDRAMINE HCL 50 MG/ML IJ SOLN
INTRAMUSCULAR | Status: AC
  Filled 2019-11-21: qty 1

## 2019-11-21 MED ORDER — SODIUM CHLORIDE 0.9% FLUSH: 3.0000 mL | INTRAVENOUS | Status: DC | PRN

## 2019-11-21 MED ORDER — DEXAMETHASONE SODIUM PHOSPHATE 4 MG/ML IJ SOLN
INTRAMUSCULAR | Status: DC | PRN
  Administered 2019-11-21: 4 mg via INTRAVENOUS

## 2019-11-21 MED ORDER — OXYTOCIN 40 UNITS IN NORMAL SALINE INFUSION - SIMPLE MED
INTRAVENOUS | Status: AC
  Filled 2019-11-21: qty 1000

## 2019-11-21 MED ORDER — METOCLOPRAMIDE HCL 5 MG/ML IJ SOLN
INTRAMUSCULAR | Status: AC
  Filled 2019-11-21: qty 2

## 2019-11-21 MED ORDER — DIBUCAINE (PERIANAL) 1 % EX OINT: 1.0000 "application " | TOPICAL_OINTMENT | CUTANEOUS | Status: DC | PRN

## 2019-11-21 MED ORDER — OXYTOCIN 40 UNITS IN NORMAL SALINE INFUSION - SIMPLE MED: 2.5000 [IU]/h | INTRAVENOUS | Status: AC

## 2019-11-21 MED ORDER — ACETAMINOPHEN 500 MG PO TABS
1000.0000 mg | ORAL_TABLET | Freq: Four times a day (QID) | ORAL | Status: DC
  Administered 2019-11-21 – 2019-11-22 (×3): 1000 mg via ORAL
  Filled 2019-11-21 (×3): qty 2

## 2019-11-21 MED ORDER — MORPHINE SULFATE (PF) 0.5 MG/ML IJ SOLN
INTRAMUSCULAR | Status: AC
  Filled 2019-11-21: qty 10

## 2019-11-21 MED ORDER — PHENYLEPHRINE HCL-NACL 20-0.9 MG/250ML-% IV SOLN
INTRAVENOUS | Status: DC | PRN
  Administered 2019-11-21: 60 ug/min via INTRAVENOUS

## 2019-11-21 MED ORDER — WITCH HAZEL-GLYCERIN EX PADS: 1.0000 "application " | MEDICATED_PAD | CUTANEOUS | Status: DC | PRN

## 2019-11-21 MED ORDER — BUPIVACAINE HCL (PF) 0.25 % IJ SOLN
INTRAMUSCULAR | Status: AC
  Filled 2019-11-21: qty 20

## 2019-11-21 MED ORDER — MEPERIDINE HCL 25 MG/ML IJ SOLN: 6.2500 mg | INTRAMUSCULAR | Status: DC | PRN

## 2019-11-21 MED ORDER — SENNOSIDES-DOCUSATE SODIUM 8.6-50 MG PO TABS
2.0000 | ORAL_TABLET | ORAL | Status: DC
  Administered 2019-11-21: 2 via ORAL
  Filled 2019-11-21: qty 2

## 2019-11-21 MED ORDER — PHENYLEPHRINE 40 MCG/ML (10ML) SYRINGE FOR IV PUSH (FOR BLOOD PRESSURE SUPPORT)
PREFILLED_SYRINGE | INTRAVENOUS | Status: AC
  Filled 2019-11-21: qty 10

## 2019-11-21 MED ORDER — CEFAZOLIN SODIUM-DEXTROSE 2-4 GM/100ML-% IV SOLN: 2.0000 g | INTRAVENOUS | Status: DC

## 2019-11-21 MED ORDER — ONDANSETRON HCL 4 MG/2ML IJ SOLN
INTRAMUSCULAR | Status: DC | PRN
  Administered 2019-11-21: 4 mg via INTRAVENOUS

## 2019-11-21 MED ORDER — DEXAMETHASONE SODIUM PHOSPHATE 4 MG/ML IJ SOLN
INTRAMUSCULAR | Status: AC
  Filled 2019-11-21: qty 1

## 2019-11-21 MED ORDER — FENTANYL CITRATE (PF) 100 MCG/2ML IJ SOLN
INTRAMUSCULAR | Status: DC | PRN
  Administered 2019-11-21: 15 ug via INTRATHECAL

## 2019-11-21 MED ORDER — METHYLERGONOVINE MALEATE 0.2 MG/ML IJ SOLN: 0.2000 mg | INTRAMUSCULAR | Status: DC | PRN

## 2019-11-21 MED ORDER — LACTATED RINGERS IV SOLN: INTRAVENOUS | Status: DC

## 2019-11-21 MED ORDER — PHENYLEPHRINE HCL-NACL 20-0.9 MG/250ML-% IV SOLN
INTRAVENOUS | Status: AC
  Filled 2019-11-21: qty 250

## 2019-11-21 MED ORDER — PROMETHAZINE HCL 25 MG/ML IJ SOLN: 6.2500 mg | INTRAMUSCULAR | Status: DC | PRN

## 2019-11-21 MED ORDER — NALOXONE HCL 4 MG/10ML IJ SOLN
1.0000 ug/kg/h | INTRAVENOUS | Status: DC | PRN
  Filled 2019-11-21: qty 5

## 2019-11-21 MED ORDER — METHYLERGONOVINE MALEATE 0.2 MG PO TABS: 0.2000 mg | ORAL_TABLET | ORAL | Status: DC | PRN

## 2019-11-21 MED ORDER — MENTHOL 3 MG MT LOZG
1.0000 | LOZENGE | OROMUCOSAL | Status: DC | PRN
  Administered 2019-11-21 – 2019-11-22 (×2): 3 mg via ORAL
  Filled 2019-11-21 (×2): qty 9

## 2019-11-21 MED ORDER — BUSPIRONE HCL 5 MG PO TABS: 10.0000 mg | ORAL_TABLET | Freq: Two times a day (BID) | ORAL | Status: DC | PRN

## 2019-11-21 MED ORDER — ONDANSETRON HCL 4 MG/2ML IJ SOLN
INTRAMUSCULAR | Status: AC
  Filled 2019-11-21: qty 2

## 2019-11-21 MED ORDER — FENTANYL CITRATE (PF) 100 MCG/2ML IJ SOLN
INTRAMUSCULAR | Status: AC
  Filled 2019-11-21: qty 2

## 2019-11-21 MED ORDER — NALOXONE HCL 0.4 MG/ML IJ SOLN: 0.4000 mg | INTRAMUSCULAR | Status: DC | PRN

## 2019-11-21 MED ORDER — PRENATAL MULTIVITAMIN CH
1.0000 | ORAL_TABLET | Freq: Every day | ORAL | Status: DC
  Administered 2019-11-22: 1 via ORAL
  Filled 2019-11-21: qty 1

## 2019-11-21 MED ORDER — OXYCODONE-ACETAMINOPHEN 5-325 MG PO TABS: 1.0000 | ORAL_TABLET | ORAL | Status: DC | PRN

## 2019-11-21 MED ORDER — CEFAZOLIN SODIUM-DEXTROSE 2-4 GM/100ML-% IV SOLN
INTRAVENOUS | Status: AC
  Filled 2019-11-21: qty 100

## 2019-11-21 MED ORDER — ONDANSETRON HCL 4 MG/2ML IJ SOLN: 4.0000 mg | Freq: Three times a day (TID) | INTRAMUSCULAR | Status: DC | PRN

## 2019-11-21 MED ORDER — ZOLPIDEM TARTRATE 5 MG PO TABS: 5.0000 mg | ORAL_TABLET | Freq: Every evening | ORAL | Status: DC | PRN

## 2019-11-21 MED ORDER — SIMETHICONE 80 MG PO CHEW
80.0000 mg | CHEWABLE_TABLET | Freq: Three times a day (TID) | ORAL | Status: DC
  Administered 2019-11-22 (×2): 80 mg via ORAL
  Filled 2019-11-21 (×2): qty 1

## 2019-11-21 MED ORDER — KETOROLAC TROMETHAMINE 30 MG/ML IJ SOLN
30.0000 mg | Freq: Four times a day (QID) | INTRAMUSCULAR | Status: DC
  Administered 2019-11-21 – 2019-11-22 (×2): 30 mg via INTRAVENOUS
  Filled 2019-11-21 (×2): qty 1

## 2019-11-21 MED ORDER — DIPHENHYDRAMINE HCL 50 MG/ML IJ SOLN
12.5000 mg | INTRAMUSCULAR | Status: DC | PRN
  Administered 2019-11-21: 12.5 mg via INTRAVENOUS

## 2019-11-21 MED ORDER — IBUPROFEN 800 MG PO TABS
800.0000 mg | ORAL_TABLET | Freq: Four times a day (QID) | ORAL | Status: DC
  Administered 2019-11-22: 800 mg via ORAL
  Filled 2019-11-21: qty 1

## 2019-11-21 MED ORDER — SCOPOLAMINE 1 MG/3DAYS TD PT72: 1.0000 | MEDICATED_PATCH | Freq: Once | TRANSDERMAL | Status: DC

## 2019-11-21 MED ORDER — SODIUM CHLORIDE 0.9 % IV SOLN: INTRAVENOUS | Status: DC | PRN

## 2019-11-21 MED ORDER — KETOROLAC TROMETHAMINE 30 MG/ML IJ SOLN
30.0000 mg | Freq: Once | INTRAMUSCULAR | Status: AC | PRN
  Administered 2019-11-21: 30 mg via INTRAVENOUS

## 2019-11-21 MED ORDER — HYDROMORPHONE HCL 1 MG/ML IJ SOLN: 0.2500 mg | INTRAMUSCULAR | Status: DC | PRN

## 2019-11-21 MED ORDER — TETANUS-DIPHTH-ACELL PERTUSSIS 5-2.5-18.5 LF-MCG/0.5 IM SUSP: 0.5000 mL | Freq: Once | INTRAMUSCULAR | Status: DC

## 2019-11-21 MED ORDER — MORPHINE SULFATE (PF) 0.5 MG/ML IJ SOLN
INTRAMUSCULAR | Status: DC | PRN
  Administered 2019-11-21: .15 ug via INTRATHECAL

## 2019-11-21 SURGICAL SUPPLY — 38 items
CLAMP CORD UMBIL (MISCELLANEOUS) IMPLANT
CLOTH BEACON ORANGE TIMEOUT ST (SAFETY) ×2 IMPLANT
DECANTER SPIKE VIAL GLASS SM (MISCELLANEOUS) ×1 IMPLANT
DRSG OPSITE POSTOP 4X10 (GAUZE/BANDAGES/DRESSINGS) ×2 IMPLANT
ELECT REM PT RETURN 9FT ADLT (ELECTROSURGICAL) ×2
ELECTRODE REM PT RTRN 9FT ADLT (ELECTROSURGICAL) ×1 IMPLANT
EXTRACTOR VACUUM M CUP 4 TUBE (SUCTIONS) IMPLANT
GAUZE SPONGE 4X4 12PLY STRL LF (GAUZE/BANDAGES/DRESSINGS) ×1 IMPLANT
GLOVE BIO SURGEON STRL SZ7.5 (GLOVE) ×2 IMPLANT
GLOVE BIOGEL PI IND STRL 7.0 (GLOVE) ×1 IMPLANT
GLOVE BIOGEL PI INDICATOR 7.0 (GLOVE) ×1
GOWN STRL REUS W/TWL LRG LVL3 (GOWN DISPOSABLE) ×4 IMPLANT
KIT ABG SYR 3ML LUER SLIP (SYRINGE) IMPLANT
NDL HYPO 25X5/8 SAFETYGLIDE (NEEDLE) IMPLANT
NDL SPNL 20GX3.5 QUINCKE YW (NEEDLE) IMPLANT
NEEDLE HYPO 22GX1.5 SAFETY (NEEDLE) ×2 IMPLANT
NEEDLE HYPO 25X5/8 SAFETYGLIDE (NEEDLE) IMPLANT
NEEDLE SPNL 20GX3.5 QUINCKE YW (NEEDLE) IMPLANT
NS IRRIG 1000ML POUR BTL (IV SOLUTION) ×2 IMPLANT
PACK C SECTION WH (CUSTOM PROCEDURE TRAY) ×2 IMPLANT
PAD ABD 7.5X8 STRL (GAUZE/BANDAGES/DRESSINGS) ×1 IMPLANT
PAD OB MATERNITY 4.3X12.25 (PERSONAL CARE ITEMS) ×2 IMPLANT
SUT MNCRL 0 VIOLET CTX 36 (SUTURE) ×2 IMPLANT
SUT MNCRL AB 3-0 PS2 27 (SUTURE) IMPLANT
SUT MON AB 2-0 CT1 27 (SUTURE) ×2 IMPLANT
SUT MON AB-0 CT1 36 (SUTURE) ×4 IMPLANT
SUT MONOCRYL 0 CTX 36 (SUTURE) ×4
SUT PLAIN 0 NONE (SUTURE) IMPLANT
SUT PLAIN 2 0 (SUTURE)
SUT PLAIN 2 0 XLH (SUTURE) IMPLANT
SUT PLAIN ABS 2-0 CT1 27XMFL (SUTURE) IMPLANT
SUT VIC AB 4-0 KS 27 (SUTURE) ×1 IMPLANT
SYR 20CC LL (SYRINGE) IMPLANT
SYR CONTROL 10ML LL (SYRINGE) ×2 IMPLANT
TAPE CLOTH SURG 4X10 WHT LF (GAUZE/BANDAGES/DRESSINGS) ×1 IMPLANT
TOWEL OR 17X24 6PK STRL BLUE (TOWEL DISPOSABLE) ×2 IMPLANT
TRAY FOLEY W/BAG SLVR 14FR LF (SET/KITS/TRAYS/PACK) ×2 IMPLANT
WATER STERILE IRR 1000ML POUR (IV SOLUTION) ×2 IMPLANT

## 2019-11-21 NOTE — Anesthesia Procedure Notes (Signed)
Spinal  Patient location during procedure: OR Start time: 11/21/2019 2:38 PM End time: 11/21/2019 2:42 PM Staffing Performed: anesthesiologist  Anesthesiologist: Lyn Hollingshead, MD Preanesthetic Checklist Completed: patient identified, IV checked, site marked, risks and benefits discussed, surgical consent, monitors and equipment checked, pre-op evaluation and timeout performed Spinal Block Patient position: sitting Prep: DuraPrep and site prepped and draped Patient monitoring: continuous pulse ox and blood pressure Approach: midline Location: L3-4 Injection technique: single-shot Needle Needle type: Pencan  Needle gauge: 24 G Needle length: 10 cm Needle insertion depth: 6 cm Assessment Sensory level: T4

## 2019-11-21 NOTE — Transfer of Care (Signed)
Immediate Anesthesia Transfer of Care Note  Patient: [name]  Procedure(s) Performed: CESAREAN SECTION WITH BILATERAL TUBAL LIGATION (N/A )  Patient Location: PACU  Anesthesia Type:Spinal  Level of Consciousness: awake, alert  and oriented  Airway & Oxygen Therapy: Patient Spontanous Breathing  Post-op Assessment: Report given to RN and Post -op Vital signs reviewed and stable  Post vital signs: Reviewed and stable  Last Vitals:  Vitals Value Taken Time  BP    Temp    Pulse    Resp    SpO2      Last Pain:  Vitals:   11/21/19 1306  TempSrc: Oral  PainSc: 0-No pain         Complications: No apparent anesthesia complications

## 2019-11-21 NOTE — Op Note (Signed)
Cesarean Section Procedure Note  Indications: previous uterine incision kerr x one and Elective TL  Pre-operative Diagnosis: 39 week 1 day pregnancy.  Post-operative Diagnosis: same  Surgeon: Lovenia Kim   Assistants: Eddie Dibbles, CNM  Anesthesia: Local anesthesia 0.25.% bupivacaine and Spinal anesthesia  ASA Class: 2  Procedure Details  The patient was seen in the Holding Room. The risks, benefits, complications, treatment options, and expected outcomes were discussed with the patient.  The patient concurred with the proposed plan, giving informed consent. The risks of anesthesia, infection, bleeding and possible injury to other organs discussed. Injury to bowel, bladder, or ureter with possible need for repair discussed. Possible need for transfusion with secondary risks of hepatitis or HIV acquisition discussed. Post operative complications to include but not limited to DVT, PE and Pneumonia noted. The site of surgery properly noted/marked. The patient was taken to Operating Room # C, identified as CINNAMON VONGPHAKDY and the procedure verified as C-Section Delivery. A Time Out was held and the above information confirmed.  After induction of anesthesia, the patient was draped and prepped in the usual sterile manner. A Pfannenstiel incision was made and carried down through the subcutaneous tissue to the fascia. Fascial incision was made and extended transversely using Mayo scissors. The fascia was separated from the underlying rectus tissue superiorly and inferiorly. The peritoneum was identified and entered. Peritoneal incision was extended longitudinally. The utero-vesical peritoneal reflection was incised transversely and the bladder flap was bluntly freed from the lower uterine segment. A low transverse uterine incision(Kerr hysterotomy) was made. Delivered from vertex presentation was a  female with Apgar scores of 8 at one minute and 9 at five minutes. Bulb suctioning gently performed. Neonatal  team in attendance.After the umbilical cord was clamped and cut cord blood was obtained for evaluation. The placenta was removed intact and appeared normal. The uterus was curetted with a dry lap pack. Good hemostasis was noted.The uterine outline, tubes and ovaries appeared normal. The uterine incision was closed with running locked sutures of 0 Monocryl x 2 layers. Hemostasis was observed. Lavage was carried out until clear.The parietal peritoneum was closed with a running 2-0 Monocryl suture. The fascia was then reapproximated with running sutures of 0 Monocryl. The skin was reapproximated with 4-0 vicryl after [place] closure with 2-0 plain.  Instrument, sponge, and needle counts were correct prior the abdominal closure and at the conclusion of the case.   Findings: FTLF, OA, Apgars 8/9, nl uterus, nl tubes  Estimated Blood Loss:  500         Drains: foley                 Specimens: placenta                 Complications:  None; patient tolerated the procedure well.         Disposition: PACU - hemodynamically stable.         Condition: stable  Attending Attestation: I performed the procedure.

## 2019-11-21 NOTE — Anesthesia Preprocedure Evaluation (Addendum)
Anesthesia Evaluation  Patient identified by MRN, date of birth, ID band Patient awake    Reviewed: Allergy & Precautions, H&P , NPO status , Patient's Chart, lab work & pertinent test results  Airway Mallampati: II  TM Distance: >3 FB Neck ROM: full    Dental no notable dental hx. (+) Teeth Intact   Pulmonary former smoker,    Pulmonary exam normal breath sounds clear to auscultation       Cardiovascular negative cardio ROS   Rhythm:regular Rate:Normal     Neuro/Psych    GI/Hepatic Neg liver ROS,   Endo/Other  Hypothyroidism   Renal/GU      Musculoskeletal   Abdominal (+) + obese,   Peds  Hematology   Anesthesia Other Findings   Reproductive/Obstetrics (+) Pregnancy                            Anesthesia Physical Anesthesia Plan  ASA: III  Anesthesia Plan: Spinal   Post-op Pain Management:    Induction:   PONV Risk Score and Plan: 3 and Ondansetron, Dexamethasone and Scopolamine patch - Pre-op  Airway Management Planned: Nasal Cannula and Natural Airway  Additional Equipment: None  Intra-op Plan:   Post-operative Plan:   Informed Consent: I have reviewed the patients History and Physical, chart, labs and discussed the procedure including the risks, benefits and alternatives for the proposed anesthesia with the patient or authorized representative who has indicated his/her understanding and acceptance.       Plan Discussed with: CRNA  Anesthesia Plan Comments:        Anesthesia Quick Evaluation

## 2019-11-21 NOTE — Anesthesia Postprocedure Evaluation (Signed)
Anesthesia Post Note  Patient: [name]  Procedure(s) Performed: CESAREAN SECTION WITH BILATERAL TUBAL LIGATION (N/A )     Patient location during evaluation: PACU Anesthesia Type: Spinal Level of consciousness: awake Pain management: pain level controlled Vital Signs Assessment: post-procedure vital signs reviewed and stable Respiratory status: spontaneous breathing Cardiovascular status: stable Postop Assessment: no headache, no backache, spinal receding, patient able to bend at knees and no apparent nausea or vomiting Anesthetic complications: no    Last Vitals:  Vitals:   11/21/19 1630 11/21/19 1645  BP: 114/64 115/61  Pulse: (!) 107 97  Resp: 18 20  Temp: 36.5 C   SpO2: 100% 100%    Last Pain:  Vitals:   11/21/19 1630  TempSrc: Oral  PainSc: 0-No pain   Pain Goal:                Epidural/Spinal Function Cutaneous sensation: Able to Wiggle Toes (11/21/19 1630), Patient able to flex knees: Yes (11/21/19 1630), Patient able to lift hips off bed: No (11/21/19 1630), Back pain beyond tenderness at insertion site: No (11/21/19 1630), Progressively worsening motor and/or sensory loss: No (11/21/19 1630), Bowel and/or bladder incontinence post epidural: No (11/21/19 [place])  Huston Foley

## 2019-11-21 NOTE — H&P (Signed)
[name] is a 34 y.o. female presenting for rpt csection and TL. OB History    Gravida  6   Para  1   Term  1   Preterm  0   AB  4   Living  1     SAB  3   TAB  0   Ectopic  1   Multiple  0   Live Births  1          Past Medical History:  Diagnosis Date  . Anxiety   . Asthma    CHILDHOOD  . Bacterial vaginitis   . Chronic kidney disease    INTERTERSIAL CYSTITIS  . Complication of anesthesia    has concerns from anesthesia from last CS  . Frequency of urination   . GERD (gastroesophageal reflux disease)   . Headache    MIGRAINES   . Heart murmur   . History of pre-eclampsia   . Hyperemesis   . Hypothyroidism    no longer needs medication  . Interstitial cystitis   . LGSIL on Pap smear of cervix   . Mental disorder   . Nocturia   . Pelvic pain in female   . Spotting between menses   . Urgency of urination    Past Surgical History:  Procedure Laterality Date  . CESAREAN SECTION N/A 07/05/2017   Procedure: CESAREAN SECTION;  Surgeon: Brien Few, MD;  Location: [facility];  Service: Obstetrics;  Laterality: N/A;  . CHROMOPERTUBATION Bilateral 07/23/2016   Procedure: CHROMOPERTUBATION;  Surgeon: Brien Few, MD;  Location: [facility];  Service: Gynecology;  Laterality: Bilateral;  . COLPOSCOPY    . CYSTO WITH HYDRODISTENSION  10/14/2011   Procedure: CYSTOSCOPY/HYDRODISTENSION;  Surgeon: Malka So, MD;  Location: [facility];  Service: Urology;  Laterality: N/A;  [place] AND CURETTAGE OF UTERUS  09/16/2011   Procedure: DILATATION AND CURETTAGE;  Surgeon: Gus Height;  Location: [facility];  Service: Gynecology;  Laterality: N/A;  Endometrial Biopsy, Urethral Dilation  . DILATION AND EVACUATION N/A 02/26/2016   Procedure: DILATATION AND EVACUATION/Chromosome Analysis;  Surgeon: Brien Few, MD;  Location: [facility];  Service: Gynecology;  Laterality: N/A;  with chromosome studies   . LAPAROSCOPIC LYSIS OF ADHESIONS     . LAPAROSCOPY  09/16/2011   Procedure: LAPAROSCOPY OPERATIVE;  Surgeon: Gus Height;  Location: [facility];  Service: Gynecology;  Laterality: N/A;  . LAPAROSCOPY N/A 07/23/2016   Procedure: LAPAROSCOPY OPERATIVE, LYSIS OF ADHESIONS;  Surgeon: Brien Few, MD;  Location: [facility];  Service: Gynecology;  Laterality: N/A;  . NOSE SURGERY  2015  . WISDOM TOOTH EXTRACTION  2008- ORAL SURG OFFICE   Family History: family history is not on file. Social History:  reports that she quit smoking about 8 years ago. Her smoking use included cigarettes. She has a 0.08 pack-year smoking history. She has never used smokeless tobacco. She reports that she does not drink alcohol or use drugs.     Maternal Diabetes: No Genetic Screening: Normal Maternal Ultrasounds/Referrals: Normal Fetal Ultrasounds or other Referrals:  None Maternal Substance Abuse:  No Significant Maternal Medications:  Meds include: Other: buspar Significant Maternal Lab Results:  Group B Strep negative Other Comments:  None  Review of Systems  Constitutional: Negative.   All other systems reviewed and are negative.  Maternal Medical History:  Reason for admission: Contractions.   Contractions: Onset was less than 1 hour ago.   Frequency: rare.  Fetal activity: Perceived fetal activity is normal.   Last perceived fetal movement was within the past hour.    Prenatal complications: PIH.   Prenatal Complications - Diabetes: none.      Blood pressure (!) 150/91, pulse (!) 107, temperature 98.2 F (36.8 C), temperature source Oral, resp. rate 16, height 5\' 5"  (1.651 m), weight 108 kg, unknown if currently breastfeeding. Maternal Exam:  Uterine Assessment: Contraction strength is mild.  Contraction frequency is rare.   Abdomen: Patient reports no abdominal tenderness. Surgical scars: low transverse.   Fetal presentation: vertex  Introitus: Normal vulva. Normal vagina.  Ferning test: not done.  Nitrazine test: not  done. Amniotic fluid character: not assessed.  Pelvis: questionable for delivery.   Cervix: Cervix evaluated by digital exam.     Physical Exam  Nursing note and vitals reviewed. Constitutional: She is oriented to person, place, and time. She appears well-developed and well-nourished.  HENT:  Head: Normocephalic and atraumatic.  Cardiovascular: Normal rate and regular rhythm.  Respiratory: Effort normal and breath sounds normal.  GI: Soft. Bowel sounds are normal.  Genitourinary:    Vulva, vagina and uterus normal.   Musculoskeletal:        General: Normal range of motion.     Cervical back: Normal range of motion and neck supple.  Neurological: She is alert and oriented to person, place, and time.  Skin: Skin is warm and dry.  Psychiatric: She has a normal mood and affect.    Prenatal labs: ABO, Rh: --/--/O POS, O POS Performed at [facility], [place] [address] [place], [place] [address]  [phone] [id number]) Antibody: NEG (03/09 [id number]) Rubella: Immune (08/10 0000) RPR: NON REACTIVE (03/09 [id number])  HBsAg: Negative (08/10 0000)  HIV: Non-reactive (08/10 0000)  GBS: Positive/-- (02/09 0000)   Assessment/Plan: 39 wk IUP Previous csection for rpt and TL Consent done. Risks vs benefits of surgery discussed. Failure rate of TL discussed.   Sadeen Wiegel J 11/21/2019, 1:46 PM

## 2019-11-21 NOTE — Lactation Note (Signed)
This note was copied from a baby's chart. Lactation Consultation Note  Patient Name: Girl Denette Preslar [id number] Date: 11/21/2019 Reason for consult: Initial assessment;Term  Visited with mom of a 32 hours old FT female, she's a P2. Mom came as breast/formula but she has decided to just do formula while at the hospital and will do pumping and bottle feeding once she's home. She BF her first baby for 3 months and had BF difficulties due to a difficult latch, she had to use a NS and started pumping and bottle feeding shortly after. She has a Medela DEBP at home.  She's already familiar with hand expression and able to get some colostrum when doing so, praised her for her efforts. RN offered to set up a DEBP but mom is not interested in pumping while at the hospital or in putting baby to breast at this point. Stressed the importance of STS care, for both mom and dad even if baby is not being BF.   Reviewed normal newborn behavior, cluster feeding, feeding cues and size of baby's stomach. RN has already provided mom with supplementation guidelines and parents are following; baby is currently on Similac 20 calorie formula.   BF brochure, BF resources and feeding diary were reviewed. Dad present and supportive. Parents reported all questions and concerns were answered, they're both aware of [facility] OP services and will call PRN.  Maternal Data Formula Feeding for Exclusion: Yes Reason for exclusion: Mother's choice to formula and breast feed on admission Has patient been taught Hand Expression?: Yes Does the patient have breastfeeding experience prior to this delivery?: Yes  Feeding Feeding Type: Bottle Fed - Formula Nipple Type: Slow - flow  LATCH Score                   Interventions Interventions: Breast feeding basics reviewed  Lactation Tools Discussed/Used     Consult Status Consult Status: PRN Follow-up type: In-patient    Shardai Star Francene Boyers 11/21/2019, 10:37 PM

## 2019-11-22 LAB — CBC
HCT: 30.7 % — ABNORMAL LOW (ref 36.0–46.0)
Hemoglobin: 10.1 g/dL — ABNORMAL LOW (ref 12.0–15.0)
MCH: 29.1 pg (ref 26.0–34.0)
MCHC: 32.9 g/dL (ref 30.0–36.0)
MCV: 88.5 fL (ref 80.0–100.0)
Platelets: 226 10*3/uL (ref 150–400)
RBC: 3.47 MIL/uL — ABNORMAL LOW (ref 3.87–5.11)
RDW: 14 % (ref 11.5–15.5)
WBC: 12.7 10*3/uL — ABNORMAL HIGH (ref 4.0–10.5)
nRBC: 0 % (ref 0.0–0.2)

## 2019-11-22 MED ORDER — IBUPROFEN 800 MG PO TABS: 800.0000 mg | ORAL_TABLET | Freq: Four times a day (QID) | ORAL | 0 refills | Status: DC

## 2019-11-22 MED ORDER — SIMETHICONE 80 MG PO CHEW: 80.0000 mg | CHEWABLE_TABLET | Freq: Three times a day (TID) | ORAL | 0 refills | Status: DC

## 2019-11-22 MED ORDER — SENNOSIDES-DOCUSATE SODIUM 8.6-50 MG PO TABS: 2.0000 | ORAL_TABLET | Freq: Every evening | ORAL | 0 refills | Status: DC | PRN

## 2019-11-22 MED ORDER — POLYSACCHARIDE IRON COMPLEX 150 MG PO CAPS: 150.0000 mg | ORAL_CAPSULE | Freq: Every day | ORAL | Status: DC

## 2019-11-22 MED ORDER — OXYCODONE-ACETAMINOPHEN 5-325 MG PO TABS: 1.0000 | ORAL_TABLET | ORAL | 0 refills | Status: DC | PRN

## 2019-11-22 MED ORDER — POLYSACCHARIDE IRON COMPLEX 150 MG PO CAPS: 150.0000 mg | ORAL_CAPSULE | Freq: Every day | ORAL | 3 refills | Status: DC

## 2019-11-22 MED FILL — IBUPROFEN 800 MG TAB: 800 | 7 days supply | Qty: 30 | Fill #0

## 2019-11-22 MED FILL — OXYCODONE-ACETAMINOPHEN 5-3: 5-325 | 3 days supply | Qty: 30 | Fill #0

## 2019-11-22 MED FILL — FERREX 150 CAPSULE: 150 | 30 days supply | Qty: 30 | Fill #0

## 2019-11-22 MED FILL — IBUPROFEN 800 MG TAB: 800 | 7 days supply | Qty: 30 | Fill #0 | Status: TO

## 2019-11-22 NOTE — Discharge Summary (Signed)
Obstetric Discharge Summary   Patient Name: [name] DOB: Aug 24, 1986 MRN: [id number]  Date of Admission: 11/21/2019 Date of Discharge: 11/22/2019 Date of Delivery: 11/21/2019 Gestational Age at Delivery: [redacted]w[redacted]d  Primary OB: [facility] - Dr. Ronita Hipps  Antepartum complications:  - hx of anxiety - Hypothyroidism on Synthroid - Obesity - Previous c/s - Hx. Of MTHFR Prenatal Labs:  ABO, Rh: --/--/O POS, O POS Performed at [facility], [place] [address], [place], [place] [address]  [phone] [id number]) Antibody: NEG (03/09 [id number]) Rubella: Immune (08/10 0000) RPR: NON REACTIVE (03/09 [id number])  HBsAg: Negative (08/10 0000)  HIV: Non-reactive (08/10 0000)  GBS: Positive/-- (02/09 0000)  Admitting Diagnosis: 39 weeks repeat c/s and desired sterilization  Secondary Diagnoses: Patient Active Problem List   Diagnosis Date Noted  . Postpartum care following cesarean delivery (3/10) 11/22/2019  . Previous cesarean section 11/21/2019  . Previous cesarean delivery affecting pregnancy 11/21/2019  . Contact dermatitis due to adhesives 07/06/2017  . Acute blood loss anemia 07/06/2017  . Cesarean delivery delivered: indication [facility] 07/05/2017  . 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 Ronita Hipps Delivery Type: repeat cesarean section, low transverse incision Anesthesia: spinal  Newborn Data: Live born female  Birth Weight: 9 lb 12 oz (4423 g) APGAR: 9, 9  Newborn Delivery   Birth date/time: 11/21/2019 15:03:00 Delivery type: C-Section, Low Transverse Trial of labor: No C-section categorization: Repeat       Hospital/Postpartum Course  (Cesarean Section):  Pt. Admitted at 39 weeks for repeat LTCS and bilateral tubal ligation.  See notes and delivery summary for details. Patient had an uncomplicated postpartum course. She requested early d/c home at 24 hours. By time of discharge on POD#1, her  pain was controlled on oral pain medications; she had appropriate lochia and was ambulating, voiding without difficulty, tolerating regular diet and passing flatus.   She was deemed stable for discharge to home.     Labs: CBC Latest Ref Rng & Units 11/22/2019 11/20/2019 07/06/2017  WBC 4.0 - 10.5 K/uL 12.7(H) 9.3 12.7(H)  Hemoglobin 12.0 - 15.0 g/dL 10.1(L) 13.2 9.1(L)  Hematocrit 36.0 - 46.0 % 30.7(L) 38.8 26.0(L)  Platelets 150 - 400 K/uL [id number]   Conflict (See Lab Report): O POS/O POS Performed at [facility], [place] [address] [place], [place] [address]   Physical exam:  BP 125/86 (BP Location: Left Arm)   Pulse 82   Temp 98 F (36.7 C) (Oral)   Resp 18   Ht 5\' 5"  (1.651 m)   Wt 108 kg   SpO2 98%   Breastfeeding Unknown   BMI 39.61 kg/m    Alert and Oriented X3             Lungs: Clear and unlabored             Heart: regular rate and rhythm / no murmurs             Abdomen: soft, non-tender, mild gaseous distention, active bowel sounds             Fundus: firm, non-tender, U-1             Dressing: pressure dressing in place; old drainage noted               Incision:  approximated with sutures / no erythema / mild ecchymosis / no drainage  Perineum: intact             Lochia: small rubra on pad              Extremities: trace LE edema, no calf pain or tenderness,    Disposition: stable, discharge to home Baby Feeding: formula Baby Disposition: home with mom  Contraception: s/p bilateral tubal ligation  Rh Immune globulin given: N/A Rubella vaccine given: N/A Tdap vaccine given in AP or PP setting: UTD Flu vaccine given in AP or PP setting: not on file   Plan:  Janon Eppolito Bierly was discharged to home in good condition. Follow-up appointment at [facility] in 6 weeks.  Discharge Instructions: Per After Visit Summary. Refer to After Visit Summary and [facility] discharge booklet  Activity: Advance as tolerated. Pelvic rest for 6  weeks.   Diet: Regular, Heart Healthy Discharge Medications: Allergies as of 11/22/2019      Reactions   Macrobid [nitrofurantoin Monohyd Macro] Other (See Comments)   FLU LIKE SYMPTONS   Penicillins Hives   Has patient had a PCN reaction causing immediate rash, facial/tongue/throat swelling, SOB or lightheadedness with hypotension: yes Has patient had a PCN reaction causing severe rash involving mucus membranes or skin necrosis: no Has patient had a PCN reaction that required hospitalization no Has patient had a PCN reaction occurring within the last 10 years: yes If all of the above answers are "NO", then may proceed with Cephalosporin use.      Medication List    STOP taking these medications   acetaminophen 500 MG tablet Commonly known as: TYLENOL   butalbital-acetaminophen-caffeine 50-325-40 MG tablet Commonly known as: FIORICET     TAKE these medications   busPIRone 10 MG tablet Commonly known as: BUSPAR Take 10 mg by mouth 2 (two) times daily as needed (For anxiety.).   calcium carbonate 750 MG chewable tablet Commonly known as: TUMS EX Chew 2 tablets by mouth 2 (two) times daily as needed for heartburn.   diphenhydrAMINE 25 MG tablet Commonly known as: BENADRYL Take 25 mg by mouth at bedtime as needed for allergies or sleep.   diphenhydrAMINE-zinc acetate cream Commonly known as: BENADRYL Apply 1 application topically daily as needed for itching.   docusate sodium 100 MG capsule Commonly known as: COLACE Take 200 mg by mouth daily.   fluticasone 50 MCG/ACT nasal spray Commonly known as: FLONASE Place 2 sprays into both nostrils daily.   hydrocortisone 2.5 % cream Apply 1 application topically in the morning and at bedtime.   ibuprofen 800 MG tablet Commonly known as: ADVIL Take 1 tablet (800 mg total) by mouth every 6 (six) hours. Start taking on: November 23, 2019   iron polysaccharides 150 MG capsule Commonly known as: NIFEREX Take 1 capsule (150 mg  total) by mouth daily.   levothyroxine 50 MCG tablet Commonly known as: SYNTHROID Take 50 mcg by mouth daily before breakfast.   oxyCODONE-acetaminophen 5-325 MG tablet Commonly known as: PERCOCET/ROXICET Take 1-2 tablets by mouth every 4 (four) hours as needed for moderate pain.   prenatal multivitamin Tabs tablet Take 1 tablet by mouth daily at 12 noon.   senna-docusate 8.6-50 MG tablet Commonly known as: Senokot-S Take 2 tablets by mouth at bedtime as needed for mild constipation.   simethicone 80 MG chewable tablet Commonly known as: MYLICON Chew 1 tablet (80 mg total) by mouth 3 (three) times daily after meals.            Discharge Care Instructions  (  From admission, onward)         Start     Ordered   11/22/19 0000  Discharge wound care:    Comments: Remove honeycomb dressing in 4-5 days.  Keep incision clean and dry   11/22/19 1335         Outpatient follow up:   Signed:  Lars Pinks, MSN, CNM [facility]

## 2019-11-22 NOTE — Progress Notes (Signed)
MOB was referred for history of depression/anxiety. * Referral screened out by Clinical Social Worker because none of the following criteria appear to apply: ~ History of anxiety/depression during this pregnancy, or of post-partum depression following prior delivery. ~ Diagnosis of anxiety and/or depression within last 3 years. MOB also diagnosed with anxiety in 2013.  OR * MOB's symptoms currently being treated with medication and/or therapy. Per further chart review, MOB on Buspar for anxiety.    Please contact the Clinical Social Worker if needs arise, by [facility] request, or if MOB scores greater than 9/yes to question 10 on Edinburgh Postpartum Depression Screen.     Virgie Dad Waldon Sheerin, MSW, LCSW [facility] [place] [facility] [place] [phone]

## 2019-11-22 NOTE — Progress Notes (Signed)
POSTOPERATIVE DAY # 1 S/P Repeat LTCS with bilateral tubal ligation, baby girl "Mila"  S:         Reports feeling great.  Requesting discharge home this evening.               Tolerating po intake / no nausea / no vomiting / + flatus / no BM  Denies dizziness, SOB, or CP             Bleeding is light             Pain controlled with Motrin and Tylenol             Up ad lib / ambulatory/ voiding QS without difficulty  Newborn formula feeding   O:  VS: BP 106/62 (BP Location: Left Arm)   Pulse 86   Temp 98.6 F (37 C) (Oral)   Resp 17   Ht 5\' 5"  (1.651 m)   Wt 108 kg   SpO2 98%   Breastfeeding Unknown   BMI 39.61 kg/m    LABS:               Recent Labs    11/20/19 0833 11/22/19 0613  WBC 9.3 12.7*  HGB 13.2 10.1*  PLT 267 226               Bloodtype: --/--/O POS, O POS Performed at [facility], [place] [address] [place], [place] [address]  [phone] [id number])  Rubella: Immune (08/10 0000)                                             I&O: Intake/Output      03/10 0701 - 03/11 0700 03/11 0701 - 03/12 0700   P.O. 480    I.V. (mL/kg) 4742.5 (43.9)    IV Piggyback 50    Total Intake(mL/kg) 5272.5 (48.8)    Urine (mL/kg/hr) 2825    Blood 679    Total Output 3504    Net +1768.5                      Physical Exam:             Alert and Oriented X3  Lungs: Clear and unlabored  Heart: regular rate and rhythm / no murmurs  Abdomen: soft, non-tender, mild gaseous distention, active bowel sounds             Fundus: firm, non-tender, U-1             Dressing: pressure dressing in place; old drainage noted               Incision:  approximated with sutures / no erythema / mild ecchymosis / no drainage  Perineum: intact  Lochia: small rubra on pad   Extremities: trace LE edema, no calf pain or tenderness,   A/P:      POD # 1 S/P Repeat LTCS with BTL             ABL Anemia   - Niferex 150mg  PO daily  Remove pressure dressing and replace with honeycomb dressing  Breast  care for formula feeding mothers  Discharge home after 24 hrs  W[facility] discharge book given, instructions and warning s/s reviewed.   F/u with Dr. Ronita Hipps in 6 weeks  POC in consult with Dr. Stevan Born,  MSN, CNM [facility]

## 2019-11-23 LAB — SURGICAL PATHOLOGY

## 2019-12-11 DIAGNOSIS — J342 Deviated nasal septum: Secondary | ICD-10-CM | POA: Diagnosis not present

## 2019-12-15 MED FILL — TARON-C DHA CAPSULE: 53.5-38-1 | 90 days supply | Qty: 90 | Fill #1

## 2019-12-18 ENCOUNTER — Other Ambulatory Visit: Payer: Self-pay

## 2019-12-18 ENCOUNTER — Encounter (HOSPITAL_BASED_OUTPATIENT_CLINIC_OR_DEPARTMENT_OTHER): Payer: Self-pay | Admitting: Otolaryngology

## 2019-12-20 ENCOUNTER — Other Ambulatory Visit (HOSPITAL_COMMUNITY)
Admission: RE | Admit: 2019-12-20 | Discharge: 2019-12-20 | Disposition: A | Discharge status: Home / Self Care | Payer: 59 | Source: Ambulatory Visit | Attending: Otolaryngology | Admitting: Otolaryngology

## 2019-12-20 DIAGNOSIS — Z01812 Encounter for preprocedural laboratory examination: Secondary | ICD-10-CM | POA: Diagnosis not present

## 2019-12-20 DIAGNOSIS — Z20822 Contact with and (suspected) exposure to covid-19: Secondary | ICD-10-CM | POA: Insufficient documentation

## 2019-12-20 LAB — SARS CORONAVIRUS 2 (TAT 6-24 HRS): SARS Coronavirus 2: NEGATIVE

## 2019-12-20 MED FILL — LEVOTHYROXINE 50 MCG TABLET: 50 | 30 days supply | Qty: 30 | Fill #2

## 2019-12-20 NOTE — H&P (Signed)
HPI:   [name] is a 34 y.o. female who presents as a new Patient.   Referring Provider: Self, A Referral  Chief complaint: Nasal obstruction.  HPI: She had reduction of turbinates about 4 years ago and did very well until about 1-1/2-2 years ago when she started having some intermittent nasal obstruction again. It was especially bad while she was pregnant. She delivered 3 weeks ago. She is otherwise in good health. Baby is doing well as well. She has tried Flonase without relief. She was using topical decongestant during pregnancy but has not used it since. Both sides seem to be equally affected. No other symptoms.  PMH/Meds/All/SocHx/FamHx/ROS:   Past Medical History:  Diagnosis Date  . Chronic interstitial cystitis   Past Surgical History:  Procedure Laterality Date  . CESAREAN SECTION  . CYSTOSCOPY 09-2011  . LAPAROSCOPY  . NASAL TURBINATE REDUCTION  . SINUS SURGERY   No family history of bleeding disorders, wound healing problems or difficulty with anesthesia.   Social History   Socioeconomic History  . Marital status: Single  Spouse name: Not on file  . Number of children: Not on file  . Years of education: Not on file  . Highest education level: Not on file  Occupational History  . Not on file  Tobacco Use  . Smoking status: Former Research scientist (life sciences)  . Smokeless tobacco: Never Used  Substance and Sexual Activity  . Alcohol use: Yes  . Drug use: No  . Sexual activity: Yes  Other Topics Concern  . Not on file  Social History Narrative  . Not on file   Social Determinants of Health   Financial Resource Strain:  . Difficulty of Paying Living Expenses:  Food Insecurity:  . Worried About Charity fundraiser in the Last Year:  . Arboriculturist in the Last Year:  Transportation Needs:  . Film/video editor (Medical):  Marland Kitchen Lack of Transportation (Non-Medical):  Physical Activity:  . Days of Exercise per Week:  . Minutes of Exercise per Session:  Stress:  .  Feeling of Stress :  Social Connections:  . Frequency of Communication with Friends and Family:  . Frequency of Social Gatherings with Friends and Family:  . Attends Religious Services:  . Active Member of Clubs or Organizations:  . Attends Archivist Meetings:  Marland Kitchen Marital Status:   Current Outpatient Medications:  . calcium carbonate (TUMS EX) 300 mg (750 mg) Chew chewable tablet, Take by mouth., Disp: , Rfl:  . diphenhydrAMINE-zinc acetate (BENADRYL) 1-0.1 % cream, Apply topically., Disp: , Rfl:  . fluticasone (FLONASE) 50 mcg/actuation nasal spray, 1 spray by Nasal route daily., Disp: , Rfl:  . hydrocortisone 2.5 % cream, , Disp: , Rfl:  . ibuprofen (ADVIL,MOTRIN) 800 MG tablet, , Disp: , Rfl:  . iron polysaccharides (NU-IRON) 150 mg iron capsule, Take 150 mg by mouth., Disp: , Rfl:  . levothyroxine (SYNTHROID) 50 MCG tablet, , Disp: , Rfl:  . prenatal [id number] fum/folic/dha (PRENATAL-1 ORAL), Take by mouth., Disp: , Rfl:   A complete ROS was performed with pertinent positives/negatives noted in the HPI. The remainder of the ROS are negative.   Physical Exam:   BP 125/78  Pulse 81  Temp 97.1 F (36.2 C)  Ht 1.651 m (5\' 5" )  Wt 99.8 kg (220 lb)  BMI 36.61 kg/m   General: Healthy and alert, in no distress, breathing easily. Normal affect. In a pleasant mood. Head: Normocephalic, atraumatic. No masses, or scars. Eyes: Pupils  are equal, and reactive to light. Vision is grossly intact. No spontaneous or gaze nystagmus. Ears: Ear canals are clear. Tympanic membranes are intact, with normal landmarks and the middle ears are clear and healthy. Hearing: Grossly normal. Nose: Nasal cavities are clear with healthy mucosa, no polyps or exudate. Airways are reduced secondary to bilateral septal spurs along the inferior aspect. There is also a curvature towards the left. Face: No masses or scars, facial nerve function is symmetric. Oral Cavity: No mucosal abnormalities are noted.  Tongue with normal mobility. Dentition appears healthy. Oropharynx: Tonsils are symmetric. There are no mucosal masses identified. Tongue base appears normal and healthy. Larynx/Hypopharynx: deferred Chest: Deferred Neck: No palpable masses, no cervical adenopathy, no thyroid nodules or enlargement. Neuro: Cranial nerves II-XII with normal function. Balance: Normal gate. Other findings: none.  Independent Review of Additional Tests or Records:  none  Procedures:  none  Impression & Plans:  Nasal mucosa and turbinates look healthy. There is no signs of infection or polyps. There are irregularities and deviations of the nasal septum which are the only remaining pathology. Recommend nasal septoplasty. Risks and benefits discussed in detail. We will schedule at her convenience.

## 2019-12-22 MED FILL — BUTALB-ACETAMIN-CAFF 50-325: 50-325-40 | 5 days supply | Qty: 30 | Fill #0

## 2019-12-24 ENCOUNTER — Ambulatory Visit (HOSPITAL_BASED_OUTPATIENT_CLINIC_OR_DEPARTMENT_OTHER)
Admission: RE | Admit: 2019-12-24 | Discharge: 2019-12-24 | Disposition: A | Discharge status: Home / Self Care | Payer: 59 | Attending: Otolaryngology | Admitting: Otolaryngology

## 2019-12-24 ENCOUNTER — Encounter (HOSPITAL_BASED_OUTPATIENT_CLINIC_OR_DEPARTMENT_OTHER)
Admission: RE | Disposition: A | Discharge status: Home / Self Care | Payer: Self-pay | Source: Home / Self Care | Attending: Otolaryngology

## 2019-12-24 ENCOUNTER — Encounter (HOSPITAL_BASED_OUTPATIENT_CLINIC_OR_DEPARTMENT_OTHER): Payer: Self-pay | Admitting: Otolaryngology

## 2019-12-24 ENCOUNTER — Other Ambulatory Visit: Payer: Self-pay

## 2019-12-24 ENCOUNTER — Ambulatory Visit (HOSPITAL_BASED_OUTPATIENT_CLINIC_OR_DEPARTMENT_OTHER): Payer: 59 | Admitting: Anesthesiology

## 2019-12-24 DIAGNOSIS — N3010 Interstitial cystitis (chronic) without hematuria: Secondary | ICD-10-CM | POA: Diagnosis not present

## 2019-12-24 DIAGNOSIS — Z87891 Personal history of nicotine dependence: Secondary | ICD-10-CM | POA: Insufficient documentation

## 2019-12-24 DIAGNOSIS — K219 Gastro-esophageal reflux disease without esophagitis: Secondary | ICD-10-CM | POA: Diagnosis not present

## 2019-12-24 DIAGNOSIS — Z791 Long term (current) use of non-steroidal anti-inflammatories (NSAID): Secondary | ICD-10-CM | POA: Insufficient documentation

## 2019-12-24 DIAGNOSIS — J342 Deviated nasal septum: Secondary | ICD-10-CM | POA: Diagnosis not present

## 2019-12-24 DIAGNOSIS — E039 Hypothyroidism, unspecified: Secondary | ICD-10-CM | POA: Diagnosis not present

## 2019-12-24 DIAGNOSIS — D62 Acute posthemorrhagic anemia: Secondary | ICD-10-CM | POA: Diagnosis not present

## 2019-12-24 HISTORY — DX: Nausea with vomiting, unspecified: R11.2

## 2019-12-24 HISTORY — DX: Other specified postprocedural states: Z98.890

## 2019-12-24 HISTORY — PX: SEPTOPLASTY: SHX2393

## 2019-12-24 LAB — POCT PREGNANCY, URINE: Preg Test, Ur: NEGATIVE

## 2019-12-24 SURGERY — SEPTOPLASTY, NOSE
Anesthesia: General | Site: Nose

## 2019-12-24 MED ORDER — FENTANYL CITRATE (PF) 100 MCG/2ML IJ SOLN
INTRAMUSCULAR | Status: AC
  Filled 2019-12-24: qty 2

## 2019-12-24 MED ORDER — FENTANYL CITRATE (PF) 100 MCG/2ML IJ SOLN: 50.0000 ug | INTRAMUSCULAR | Status: DC | PRN

## 2019-12-24 MED ORDER — FENTANYL CITRATE (PF) 100 MCG/2ML IJ SOLN
INTRAMUSCULAR | Status: DC | PRN
  Administered 2019-12-24: 100 ug via INTRAVENOUS
  Administered 2019-12-24 (×3): 50 ug via INTRAVENOUS

## 2019-12-24 MED ORDER — ACETAMINOPHEN 500 MG PO TABS
1000.0000 mg | ORAL_TABLET | Freq: Once | ORAL | Status: AC
  Administered 2019-12-24: 1000 mg via ORAL

## 2019-12-24 MED ORDER — OXYMETAZOLINE HCL 0.05 % NA SOLN
NASAL | Status: AC
  Filled 2019-12-24: qty 30

## 2019-12-24 MED ORDER — LIDOCAINE-EPINEPHRINE 1 %-1:100000 IJ SOLN
INTRAMUSCULAR | Status: AC
  Filled 2019-12-24: qty 4

## 2019-12-24 MED ORDER — HYDROMORPHONE HCL 1 MG/ML IJ SOLN
0.2500 mg | INTRAMUSCULAR | Status: DC | PRN
  Administered 2019-12-24 (×3): 0.5 mg via INTRAVENOUS

## 2019-12-24 MED ORDER — KETOROLAC TROMETHAMINE 30 MG/ML IJ SOLN: 30.0000 mg | Freq: Once | INTRAMUSCULAR | Status: DC | PRN

## 2019-12-24 MED ORDER — CIPROFLOXACIN-DEXAMETHASONE 0.3-0.1 % OT SUSP
OTIC | Status: AC
  Filled 2019-12-24: qty 7.5

## 2019-12-24 MED ORDER — OXYMETAZOLINE HCL 0.05 % NA SOLN
2.0000 | NASAL | Status: DC
  Administered 2019-12-24: 2 via NASAL

## 2019-12-24 MED ORDER — LACTATED RINGERS IV SOLN: INTRAVENOUS | Status: DC

## 2019-12-24 MED ORDER — ACETAMINOPHEN 500 MG PO TABS
ORAL_TABLET | ORAL | Status: AC
  Filled 2019-12-24: qty 2

## 2019-12-24 MED ORDER — SUCCINYLCHOLINE CHLORIDE 200 MG/10ML IV SOSY
PREFILLED_SYRINGE | INTRAVENOUS | Status: DC | PRN
  Administered 2019-12-24: 100 mg via INTRAVENOUS

## 2019-12-24 MED ORDER — OXYCODONE HCL 5 MG/5ML PO SOLN: 5.0000 mg | Freq: Once | ORAL | Status: DC | PRN

## 2019-12-24 MED ORDER — ONDANSETRON HCL 4 MG/2ML IJ SOLN
INTRAMUSCULAR | Status: AC
  Filled 2019-12-24: qty 2

## 2019-12-24 MED ORDER — MIDAZOLAM HCL 2 MG/2ML IJ SOLN
INTRAMUSCULAR | Status: AC
  Filled 2019-12-24: qty 2

## 2019-12-24 MED ORDER — ONDANSETRON HCL 4 MG/2ML IJ SOLN
INTRAMUSCULAR | Status: DC | PRN
  Administered 2019-12-24 (×2): 4 mg via INTRAVENOUS

## 2019-12-24 MED ORDER — HYDROMORPHONE HCL 1 MG/ML IJ SOLN
INTRAMUSCULAR | Status: AC
  Filled 2019-12-24: qty 0.5

## 2019-12-24 MED ORDER — PROMETHAZINE HCL 25 MG RE SUPP: 25.0000 mg | Freq: Four times a day (QID) | RECTAL | 1 refills | Status: DC | PRN

## 2019-12-24 MED ORDER — SCOPOLAMINE 1 MG/3DAYS TD PT72
MEDICATED_PATCH | TRANSDERMAL | Status: AC
  Filled 2019-12-24: qty 1

## 2019-12-24 MED ORDER — BACITRACIN ZINC 500 UNIT/GM EX OINT
TOPICAL_OINTMENT | CUTANEOUS | Status: AC
  Filled 2019-12-24: qty 28.35

## 2019-12-24 MED ORDER — LIDOCAINE 2% (20 MG/ML) 5 ML SYRINGE
INTRAMUSCULAR | Status: AC
  Filled 2019-12-24: qty 5

## 2019-12-24 MED ORDER — DEXMEDETOMIDINE HCL 200 MCG/2ML IV SOLN
INTRAVENOUS | Status: DC | PRN
  Administered 2019-12-24 (×5): 4 ug via INTRAVENOUS

## 2019-12-24 MED ORDER — OXYMETAZOLINE HCL 0.05 % NA SOLN
NASAL | Status: DC | PRN
  Administered 2019-12-24: 1 via TOPICAL

## 2019-12-24 MED ORDER — PROPOFOL 10 MG/ML IV BOLUS
INTRAVENOUS | Status: DC | PRN
  Administered 2019-12-24: 200 mg via INTRAVENOUS

## 2019-12-24 MED ORDER — BACITRACIN ZINC 500 UNIT/GM EX OINT
TOPICAL_OINTMENT | CUTANEOUS | Status: DC | PRN
  Administered 2019-12-24: 1 via TOPICAL

## 2019-12-24 MED ORDER — LIDOCAINE 2% (20 MG/ML) 5 ML SYRINGE
INTRAMUSCULAR | Status: DC | PRN
  Administered 2019-12-24: 80 mg via INTRAVENOUS

## 2019-12-24 MED ORDER — EPINEPHRINE PF 1 MG/ML IJ SOLN
INTRAMUSCULAR | Status: AC
  Filled 2019-12-24: qty 1

## 2019-12-24 MED ORDER — MEPERIDINE HCL 25 MG/ML IJ SOLN: 6.2500 mg | INTRAMUSCULAR | Status: DC | PRN

## 2019-12-24 MED ORDER — LIDOCAINE-EPINEPHRINE 1 %-1:100000 IJ SOLN
INTRAMUSCULAR | Status: DC | PRN
  Administered 2019-12-24: 4 mL

## 2019-12-24 MED ORDER — HYDROCODONE-ACETAMINOPHEN 7.5-325 MG PO TABS: 1.0000 | ORAL_TABLET | Freq: Four times a day (QID) | ORAL | 0 refills | Status: DC | PRN

## 2019-12-24 MED ORDER — DIPHENHYDRAMINE HCL 50 MG/ML IJ SOLN
INTRAMUSCULAR | Status: DC | PRN
  Administered 2019-12-24: 12.5 mg via INTRAVENOUS

## 2019-12-24 MED ORDER — MIDAZOLAM HCL 2 MG/2ML IJ SOLN: 1.0000 mg | INTRAMUSCULAR | Status: DC | PRN

## 2019-12-24 MED ORDER — MIDAZOLAM HCL 5 MG/5ML IJ SOLN
INTRAMUSCULAR | Status: DC | PRN
  Administered 2019-12-24: 2 mg via INTRAVENOUS

## 2019-12-24 MED ORDER — DIPHENHYDRAMINE HCL 50 MG/ML IJ SOLN
INTRAMUSCULAR | Status: AC
  Filled 2019-12-24: qty 1

## 2019-12-24 MED ORDER — PROMETHAZINE HCL 25 MG/ML IJ SOLN: 6.2500 mg | INTRAMUSCULAR | Status: DC | PRN

## 2019-12-24 MED ORDER — SCOPOLAMINE 1 MG/3DAYS TD PT72
1.0000 | MEDICATED_PATCH | TRANSDERMAL | Status: DC
  Administered 2019-12-24: 1.5 mg via TRANSDERMAL

## 2019-12-24 MED ORDER — OXYCODONE HCL 5 MG PO TABS: 5.0000 mg | ORAL_TABLET | Freq: Once | ORAL | Status: DC | PRN

## 2019-12-24 MED ORDER — DEXAMETHASONE SODIUM PHOSPHATE 10 MG/ML IJ SOLN
INTRAMUSCULAR | Status: DC | PRN
  Administered 2019-12-24: 10 mg via INTRAVENOUS

## 2019-12-24 MED FILL — PROMETHAZINE HCL 25 MG SUPP: 25 | 3 days supply | Qty: 12 | Fill #0

## 2019-12-24 MED FILL — HYDROCODON-APAP 7.5-325: 7.5-325 | 5 days supply | Qty: 20 | Fill #0

## 2019-12-24 SURGICAL SUPPLY — 33 items
ATTRACTOMAT 16X20 MAGNETIC DRP (DRAPES) IMPLANT
BLADE SURG 11 STRL SS (BLADE) IMPLANT
CANISTER SUCT 1200ML W/VALVE (MISCELLANEOUS) ×2 IMPLANT
CATH ROBINSON RED A/P 8FR (CATHETERS) IMPLANT
COVER WAND RF STERILE (DRAPES) IMPLANT
DECANTER SPIKE VIAL GLASS SM (MISCELLANEOUS) IMPLANT
DRSG TELFA 3X8 NADH (GAUZE/BANDAGES/DRESSINGS) ×2 IMPLANT
GAUZE 4X4 16PLY RFD (DISPOSABLE) IMPLANT
GLOVE BIO SURGEON STRL SZ 6.5 (GLOVE) ×1 IMPLANT
GLOVE BIOGEL PI IND STRL 7.0 (GLOVE) IMPLANT
GLOVE BIOGEL PI INDICATOR 7.0 (GLOVE) ×2
GLOVE ECLIPSE 6.5 STRL STRAW (GLOVE) ×1 IMPLANT
GLOVE ECLIPSE 7.5 STRL STRAW (GLOVE) ×2 IMPLANT
GOWN STRL REUS W/ TWL LRG LVL3 (GOWN DISPOSABLE) ×2 IMPLANT
GOWN STRL REUS W/TWL LRG LVL3 (GOWN DISPOSABLE) ×4
HEMOSTAT SURGICEL .5X2 ABSORB (HEMOSTASIS) IMPLANT
NDL PRECISIONGLIDE 27X1.5 (NEEDLE) ×1 IMPLANT
NEEDLE PRECISIONGLIDE 27X1.5 (NEEDLE) ×2 IMPLANT
NS IRRIG 1000ML POUR BTL (IV SOLUTION) IMPLANT
PACK BASIN DAY SURGERY FS (CUSTOM PROCEDURE TRAY) ×2 IMPLANT
PACK ENT DAY SURGERY (CUSTOM PROCEDURE TRAY) ×2 IMPLANT
PAD DRESSING TELFA 3X8 NADH (GAUZE/BANDAGES/DRESSINGS) IMPLANT
PATTIES SURGICAL .5 X3 (DISPOSABLE) ×2 IMPLANT
SHEET SILICONE 2X3 0.03 REINF (MISCELLANEOUS) IMPLANT
SPONGE GAUZE 2X2 8PLY STRL LF (GAUZE/BANDAGES/DRESSINGS) ×2 IMPLANT
STRIP CLOSURE SKIN 1/2X4 (GAUZE/BANDAGES/DRESSINGS) IMPLANT
SUT CHROMIC 4 0 P 3 18 (SUTURE) ×2 IMPLANT
SUT ETHILON 3 0 PS 1 (SUTURE) IMPLANT
SUT ETHILON 4 0 CL P 3 (SUTURE) IMPLANT
SUT ETHILON 6 0 P 1 (SUTURE) IMPLANT
SUT PLAIN 4 0 ~~LOC~~ 1 (SUTURE) ×2 IMPLANT
TOWEL GREEN STERILE FF (TOWEL DISPOSABLE) ×2 IMPLANT
YANKAUER SUCT BULB TIP NO VENT (SUCTIONS) ×2 IMPLANT

## 2019-12-24 NOTE — Transfer of Care (Signed)
Immediate Anesthesia Transfer of Care Note  Patient: [name]  Procedure(s) Performed: SEPTOPLASTY (N/A Nose)  Patient Location: PACU  Anesthesia Type:General  Level of Consciousness: drowsy and patient cooperative  Airway & Oxygen Therapy: Patient Spontanous Breathing and Patient connected to face mask oxygen  Post-op Assessment: Report given to RN and Post -op Vital signs reviewed and stable  Post vital signs: Reviewed and stable  Last Vitals:  Vitals Value Taken Time  BP 125/78 12/24/19 1051  Temp    Pulse 113 12/24/19 1054  Resp 14 12/24/19 1054  SpO2 100 % 12/24/19 1054  Vitals shown include unvalidated device data.  Last Pain:  Vitals:   12/24/19 0810  TempSrc: Tympanic  PainSc: 4          Complications: No apparent anesthesia complications

## 2019-12-24 NOTE — Anesthesia Preprocedure Evaluation (Addendum)
Anesthesia Evaluation  Patient identified by MRN, date of birth, ID band Patient awake    Reviewed: Allergy & Precautions, H&P , NPO status , Patient's Chart, lab work & pertinent test results  Airway Mallampati: II  TM Distance: >3 FB Neck ROM: full    Dental no notable dental hx. (+) Teeth Intact   Pulmonary former smoker,    Pulmonary exam normal breath sounds clear to auscultation       Cardiovascular negative cardio ROS   Rhythm:regular Rate:Normal     Neuro/Psych    GI/Hepatic Neg liver ROS, GERD  Controlled and Medicated,  Endo/Other  Hypothyroidism   Renal/GU      Musculoskeletal   Abdominal (+) + obese,   Peds  Hematology  (+) Blood dyscrasia, anemia ,   Anesthesia Other Findings   Reproductive/Obstetrics (+) Pregnancy                            Anesthesia Physical Anesthesia Plan  ASA: II  Anesthesia Plan: General   Post-op Pain Management:    Induction: Intravenous  PONV Risk Score and Plan: 3 and Ondansetron, Dexamethasone, Midazolam and Treatment may vary due to age or medical condition  Airway Management Planned: Oral ETT  Additional Equipment: None  Intra-op Plan:   Post-operative Plan: Extubation in OR  Informed Consent: I have reviewed the patients History and Physical, chart, labs and discussed the procedure including the risks, benefits and alternatives for the proposed anesthesia with the patient or authorized representative who has indicated his/her understanding and acceptance.     Dental advisory given  Plan Discussed with: CRNA  Anesthesia Plan Comments:         Anesthesia Quick Evaluation

## 2019-12-24 NOTE — Anesthesia Procedure Notes (Signed)
Procedure Name: Intubation Date/Time: 12/24/2019 10:14 AM Performed by: Gwyndolyn Saxon, CRNA Pre-anesthesia Checklist: Patient identified, Emergency Drugs available, Suction available and Patient being monitored Patient Re-evaluated:Patient Re-evaluated prior to induction Oxygen Delivery Method: Circle system utilized Preoxygenation: Pre-oxygenation with 100% oxygen Induction Type: IV induction Ventilation: Mask ventilation without difficulty Laryngoscope Size: Miller and 2 Grade View: Grade I Tube type: Oral Tube size: 7.0 mm Number of attempts: 1 Airway Equipment and Method: Stylet Placement Confirmation: ETT inserted through vocal cords under direct vision,  positive ETCO2 and breath sounds checked- equal and bilateral Secured at: 20 cm Tube secured with: Tape Dental Injury: Teeth and Oropharynx as per pre-operative assessment

## 2019-12-24 NOTE — Op Note (Signed)
OPERATIVE REPORT  DATE OF SURGERY: 12/24/2019  PATIENT:  [name],  35 y.o. female  PRE-OPERATIVE DIAGNOSIS:  deviated septum  POST-OPERATIVE DIAGNOSIS:  deviated septum  PROCEDURE:  Procedure(s): SEPTOPLASTY  SURGEON:  Beckie Salts, MD  ASSISTANTS: none  ANESTHESIA:   General   EBL: 20 ml  DRAINS: none  LOCAL MEDICATIONS USED:  1% Xylocaine with epinephrine  SPECIMEN:  none  COUNTS:  Correct  PROCEDURE DETAILS: The patient was taken to the operating room and placed on the operating table in the supine position. Following induction of general endotracheal anesthesia, the nose was prepped and draped in a standard fashion. Afrin spray was used preoperatively in the holding area. 1% Xylocaine with epinephrine was infiltrated into the septum, and the columella.  1. Nasal septoplasty. A left hemitransfixion incision was created with a 15 scalpel. A mucoperichondrial flap was developed posteriorly down the left side of the nasal septum using a Cottle elevator. This was performed all the way to the sphenoid rostrum. The bony cartilaginous junction was divided and a similar flap was developed down the right side. The ethmoid plate was deflected to the right and was mostly resected.  There is also deflection of the maxillary crest to the right and the vomer which were also partially resected.    All of these maneuvers greatly increased the nasal airways bilaterally. The nasal cavities were packed with Silastic sheeting coated with bacitracin ointment.  These were secured in place with a nylon suture.  The pharynx was suctioned of blood and secretions under direct visualization. The patient was awakened extubated and transferred to recovery in stable condition.    PATIENT DISPOSITION:  To PACU, stable

## 2019-12-24 NOTE — Interval H&P Note (Signed)
History and Physical Interval Note:  12/24/2019 9:50 AM  [name]  has presented today for surgery, with the diagnosis of deviated septum.  The various methods of treatment have been discussed with the patient and family. After consideration of risks, benefits and other options for treatment, the patient has consented to  Procedure(s): SEPTOPLASTY (N/A) as a surgical intervention.  The patient's history has been reviewed, patient examined, no change in status, stable for surgery.  I have reviewed the patient's chart and labs.  Questions were answered to the patient's satisfaction.     [name]

## 2019-12-24 NOTE — Discharge Instructions (Signed)
Tylenol given at 8:55am. Next dose 2:55pm if needed.   Use nasal saline spray every hour while awake.   Post Anesthesia Home Care Instructions  Activity: Get plenty of rest for the remainder of the day. A responsible individual must stay with you for 24 hours following the procedure.  For the next 24 hours, DO NOT: -Drive a car -Paediatric nurse -Drink alcoholic beverages -Take any medication unless instructed by your physician -Make any legal decisions or sign important papers.  Meals: Start with liquid foods such as gelatin or soup. Progress to regular foods as tolerated. Avoid greasy, spicy, heavy foods. If nausea and/or vomiting occur, drink only clear liquids until the nausea and/or vomiting subsides. Call your physician if vomiting continues.  Special Instructions/Symptoms: Your throat may feel dry or sore from the anesthesia or the breathing tube placed in your throat during surgery. If this causes discomfort, gargle with warm salt water. The discomfort should disappear within 24 hours.  If you had a scopolamine patch placed behind your ear for the management of post- operative nausea and/or vomiting:  1. The medication in the patch is effective for 72 hours, after which it should be removed.  Wrap patch in a tissue and discard in the trash. Wash hands thoroughly with soap and water. 2. You may remove the patch earlier than 72 hours if you experience unpleasant side effects which may include dry mouth, dizziness or visual disturbances. 3. Avoid touching the patch. Wash your hands with soap and water after contact with the patch.

## 2019-12-24 NOTE — Anesthesia Postprocedure Evaluation (Signed)
Anesthesia Post Note  Patient: [name]  Procedure(s) Performed: SEPTOPLASTY (N/A Nose)     Patient location during evaluation: PACU Anesthesia Type: General Level of consciousness: awake and alert, oriented and patient cooperative Pain management: pain level controlled Vital Signs Assessment: post-procedure vital signs reviewed and stable Respiratory status: spontaneous breathing, nonlabored ventilation and respiratory function stable Cardiovascular status: blood pressure returned to baseline and stable Postop Assessment: no apparent nausea or vomiting Anesthetic complications: no    Last Vitals:  Vitals:   12/24/19 1130 12/24/19 1140  BP: 106/80   Pulse: 71 68  Resp: 14 12  Temp:    SpO2: 99% 96%    Last Pain:  Vitals:   12/24/19 1139  TempSrc:   PainSc: [place]

## 2019-12-25 ENCOUNTER — Encounter: Payer: Self-pay | Admitting: *Deleted

## 2019-12-27 ENCOUNTER — Other Ambulatory Visit (HOSPITAL_COMMUNITY): Payer: Self-pay | Admitting: Internal Medicine

## 2019-12-27 DIAGNOSIS — E039 Hypothyroidism, unspecified: Secondary | ICD-10-CM | POA: Diagnosis not present

## 2019-12-27 DIAGNOSIS — H0288A Meibomian gland dysfunction right eye, upper and lower eyelids: Secondary | ICD-10-CM | POA: Diagnosis not present

## 2019-12-27 DIAGNOSIS — G43009 Migraine without aura, not intractable, without status migrainosus: Secondary | ICD-10-CM | POA: Diagnosis not present

## 2019-12-27 DIAGNOSIS — F419 Anxiety disorder, unspecified: Secondary | ICD-10-CM | POA: Diagnosis not present

## 2019-12-27 DIAGNOSIS — Z803 Family history of malignant neoplasm of breast: Secondary | ICD-10-CM | POA: Diagnosis not present

## 2019-12-27 DIAGNOSIS — H0288B Meibomian gland dysfunction left eye, upper and lower eyelids: Secondary | ICD-10-CM | POA: Diagnosis not present

## 2019-12-27 DIAGNOSIS — H5213 Myopia, bilateral: Secondary | ICD-10-CM | POA: Diagnosis not present

## 2019-12-27 DIAGNOSIS — E559 Vitamin D deficiency, unspecified: Secondary | ICD-10-CM | POA: Diagnosis not present

## 2019-12-27 DIAGNOSIS — N3010 Interstitial cystitis (chronic) without hematuria: Secondary | ICD-10-CM | POA: Diagnosis not present

## 2019-12-27 DIAGNOSIS — K219 Gastro-esophageal reflux disease without esophagitis: Secondary | ICD-10-CM | POA: Diagnosis not present

## 2019-12-27 DIAGNOSIS — L309 Dermatitis, unspecified: Secondary | ICD-10-CM | POA: Diagnosis not present

## 2019-12-27 MED FILL — IBUPROFEN 600 MG TABLET: 600 | 30 days supply | Qty: 90 | Fill #0

## 2019-12-27 MED FILL — TRIAMCINOLONE ACETONIDE 0.0: 0.025 | 30 days supply | Qty: 30 | Fill #0

## 2019-12-27 MED FILL — ALPRAZolam 0.5 MG TABS: 0.5 | 10 days supply | Qty: 10 | Fill #0

## 2019-12-28 MED FILL — FROVATRIPTAN SUCC 2.5 MG TA: 2.5 | 30 days supply | Qty: 15 | Fill #0

## 2020-01-02 DIAGNOSIS — E039 Hypothyroidism, unspecified: Secondary | ICD-10-CM | POA: Diagnosis not present

## 2020-01-02 MED FILL — LEVOTHYROXINE SODIUM 25 MCG: 25 | 30 days supply | Qty: 30 | Fill #0

## 2020-01-29 MED FILL — LEVOTHYROXINE SODIUM 25 MCG: 25 | 30 days supply | Qty: 30 | Fill #1

## 2020-01-30 ENCOUNTER — Other Ambulatory Visit (HOSPITAL_COMMUNITY): Payer: Self-pay | Admitting: Obstetrics and Gynecology

## 2020-01-30 MED FILL — CITALOPRAM HBR 10 MG TABLET: 10 | 30 days supply | Qty: 30 | Fill #0

## 2020-01-31 MED FILL — PHENTERMINE 37.5 MG TABLET: 37.5 | 30 days supply | Qty: 30 | Fill #0

## 2020-02-07 DIAGNOSIS — D485 Neoplasm of uncertain behavior of skin: Secondary | ICD-10-CM | POA: Diagnosis not present

## 2020-02-07 DIAGNOSIS — L578 Other skin changes due to chronic exposure to nonionizing radiation: Secondary | ICD-10-CM | POA: Diagnosis not present

## 2020-02-07 DIAGNOSIS — D2271 Melanocytic nevi of right lower limb, including hip: Secondary | ICD-10-CM | POA: Diagnosis not present

## 2020-02-07 DIAGNOSIS — Z86018 Personal history of other benign neoplasm: Secondary | ICD-10-CM | POA: Diagnosis not present

## 2020-02-07 DIAGNOSIS — L821 Other seborrheic keratosis: Secondary | ICD-10-CM | POA: Diagnosis not present

## 2020-02-08 MED FILL — BUTALB-ACETAMIN-CAFF 50-325: 50-325-40 | 5 days supply | Qty: 30 | Fill #0

## 2020-02-19 ENCOUNTER — Other Ambulatory Visit (HOSPITAL_COMMUNITY): Payer: Self-pay | Admitting: Obstetrics and Gynecology

## 2020-02-19 DIAGNOSIS — K644 Residual hemorrhoidal skin tags: Secondary | ICD-10-CM | POA: Diagnosis not present

## 2020-02-19 DIAGNOSIS — F411 Generalized anxiety disorder: Secondary | ICD-10-CM | POA: Diagnosis not present

## 2020-02-19 DIAGNOSIS — E039 Hypothyroidism, unspecified: Secondary | ICD-10-CM | POA: Diagnosis not present

## 2020-02-26 MED FILL — LEVOTHYROXINE SODIUM 25 MCG: 25 | 30 days supply | Qty: 30 | Fill #0

## 2020-02-26 MED FILL — CITALOPRAM HBR 10 MG TABLET: 10 | 30 days supply | Qty: 30 | Fill #1

## 2020-02-29 MED FILL — PHENTERMINE 37.5 MG TABLET: 37.5 | 30 days supply | Qty: 30 | Fill #0

## 2020-03-04 DIAGNOSIS — D2371 Other benign neoplasm of skin of right lower limb, including hip: Secondary | ICD-10-CM | POA: Diagnosis not present

## 2020-03-04 DIAGNOSIS — D2271 Melanocytic nevi of right lower limb, including hip: Secondary | ICD-10-CM | POA: Diagnosis not present

## 2020-03-06 DIAGNOSIS — F411 Generalized anxiety disorder: Secondary | ICD-10-CM | POA: Diagnosis not present

## 2020-03-12 MED FILL — VIRT-C DHA SOFTGEL: 53.5-38-1 | 90 days supply | Qty: 90 | Fill #0

## 2020-03-13 DIAGNOSIS — F411 Generalized anxiety disorder: Secondary | ICD-10-CM | POA: Diagnosis not present

## 2020-03-20 DIAGNOSIS — F411 Generalized anxiety disorder: Secondary | ICD-10-CM | POA: Diagnosis not present

## 2020-03-21 DIAGNOSIS — F411 Generalized anxiety disorder: Secondary | ICD-10-CM | POA: Diagnosis not present

## 2020-03-21 DIAGNOSIS — L089 Local infection of the skin and subcutaneous tissue, unspecified: Secondary | ICD-10-CM | POA: Diagnosis not present

## 2020-03-21 DIAGNOSIS — N764 Abscess of vulva: Secondary | ICD-10-CM | POA: Diagnosis not present

## 2020-03-21 MED FILL — LEVOTHYROXINE SODIUM 25 MCG: 25 | 30 days supply | Qty: 30 | Fill #1

## 2020-03-24 MED FILL — FLUCONAZOLE 150 MG TABS: 150 | 4 days supply | Qty: 2 | Fill #0

## 2020-03-24 MED FILL — BUTALB-ACETAMIN-CAFF 50-325: 50-325-40 | 5 days supply | Qty: 30 | Fill #0

## 2020-03-24 MED FILL — CITALOPRAM HBR 10 MG TABLET: 10 | 30 days supply | Qty: 30 | Fill #2

## 2020-03-28 MED FILL — PHENTERMINE 37.5 MG TABLET: 37.5 | 30 days supply | Qty: 30 | Fill #0

## 2020-04-17 DIAGNOSIS — M9901 Segmental and somatic dysfunction of cervical region: Secondary | ICD-10-CM | POA: Diagnosis not present

## 2020-04-18 ENCOUNTER — Other Ambulatory Visit (HOSPITAL_COMMUNITY): Payer: Self-pay | Admitting: Internal Medicine

## 2020-04-18 MED FILL — IBUPROFEN 600 MG TABLET: 600 | 30 days supply | Qty: 90 | Fill #0

## 2020-04-18 MED FILL — ALPRAZolam 0.5 MG TABS: 0.5 | 10 days supply | Qty: 10 | Fill #0

## 2020-04-18 MED FILL — LEVOTHYROXINE SODIUM 25 MCG: 25 | 90 days supply | Qty: 90 | Fill #2

## 2020-04-18 MED FILL — FROVATRIPTAN SUCC 2.5 MG TA: 2.5 | 30 days supply | Qty: 15 | Fill #1

## 2020-04-22 DIAGNOSIS — M9901 Segmental and somatic dysfunction of cervical region: Secondary | ICD-10-CM | POA: Diagnosis not present

## 2020-04-29 DIAGNOSIS — N920 Excessive and frequent menstruation with regular cycle: Secondary | ICD-10-CM | POA: Diagnosis not present

## 2020-04-29 MED FILL — PHENTERMINE 37.5 MG TABLET: 37.5 | 30 days supply | Qty: 30 | Fill #0

## 2020-05-02 ENCOUNTER — Ambulatory Visit
Admission: RE | Admit: 2020-05-02 | Discharge: 2020-05-02 | Disposition: A | Discharge status: Home / Self Care | Payer: 59 | Source: Ambulatory Visit | Attending: Internal Medicine | Admitting: Internal Medicine

## 2020-05-02 ENCOUNTER — Other Ambulatory Visit: Payer: Self-pay | Admitting: Internal Medicine

## 2020-05-02 DIAGNOSIS — M7989 Other specified soft tissue disorders: Secondary | ICD-10-CM | POA: Diagnosis not present

## 2020-05-02 DIAGNOSIS — M25531 Pain in right wrist: Secondary | ICD-10-CM

## 2020-05-02 DIAGNOSIS — S62144A Nondisplaced fracture of body of hamate [unciform] bone, right wrist, initial encounter for closed fracture: Secondary | ICD-10-CM | POA: Diagnosis not present

## 2020-05-08 DIAGNOSIS — N920 Excessive and frequent menstruation with regular cycle: Secondary | ICD-10-CM | POA: Diagnosis not present

## 2020-05-13 ENCOUNTER — Encounter: Payer: Self-pay | Admitting: Internal Medicine

## 2020-05-13 DIAGNOSIS — K644 Residual hemorrhoidal skin tags: Secondary | ICD-10-CM | POA: Diagnosis not present

## 2020-05-15 DIAGNOSIS — M25531 Pain in right wrist: Secondary | ICD-10-CM | POA: Diagnosis not present

## 2020-05-28 ENCOUNTER — Encounter: Payer: Self-pay | Admitting: Neurology

## 2020-05-29 ENCOUNTER — Encounter: Payer: Self-pay | Admitting: Neurology

## 2020-05-29 ENCOUNTER — Ambulatory Visit (INDEPENDENT_AMBULATORY_CARE_PROVIDER_SITE_OTHER): Payer: 59 | Admitting: Neurology

## 2020-05-29 ENCOUNTER — Other Ambulatory Visit (HOSPITAL_COMMUNITY): Payer: Self-pay | Admitting: Neurology

## 2020-05-29 VITALS — BP 101/68 | HR 101 | Ht 65.0 in | Wt 202.0 lb

## 2020-05-29 DIAGNOSIS — G44049 Chronic paroxysmal hemicrania, not intractable: Secondary | ICD-10-CM

## 2020-05-29 DIAGNOSIS — M542 Cervicalgia: Secondary | ICD-10-CM | POA: Diagnosis not present

## 2020-05-29 DIAGNOSIS — O99345 Other mental disorders complicating the puerperium: Secondary | ICD-10-CM | POA: Diagnosis not present

## 2020-05-29 DIAGNOSIS — G43711 Chronic migraine without aura, intractable, with status migrainosus: Secondary | ICD-10-CM | POA: Diagnosis not present

## 2020-05-29 DIAGNOSIS — F53 Postpartum depression: Secondary | ICD-10-CM

## 2020-05-29 MED ORDER — PROPRANOLOL HCL 20 MG PO TABS: 20.0000 mg | ORAL_TABLET | Freq: Two times a day (BID) | ORAL | 6 refills | Status: DC

## 2020-05-29 MED ORDER — CYCLOBENZAPRINE HCL 5 MG PO TABS: 5.0000 mg | ORAL_TABLET | Freq: Every day | ORAL | 5 refills | Status: DC

## 2020-05-29 MED FILL — CYCLOBENZAPRINE HCL 5 MG TA: 5 | 30 days supply | Qty: 30 | Fill #0

## 2020-05-29 MED FILL — CITALOPRAM HBR 10 MG TABLET: 10 | 30 days supply | Qty: 30 | Fill #3

## 2020-05-29 MED FILL — PROPRANOLOL 20 MG TABLET: 20 | 30 days supply | Qty: 60 | Fill #0

## 2020-05-29 NOTE — Progress Notes (Signed)
SLEEP MEDICINE CLINIC    Provider:  Larey Seat, MD  Primary Care Physician:  Josetta Huddle, MD [address] [place] [address]     Referring Provider: Josetta Huddle, Md [address] [place],  [place] [address]          Chief Complaint according to patient   Patient presents with:    . New Patient (Initial Visit)      neck- tension, headaches since age 34 and not sleeping well- postpartum depression, with a baby and toddler at home. OB started celexa but she d/c it on her  own- more tearful.Marland Kitchen       HISTORY OF PRESENT ILLNESS:  [name] is a 34 year- old Caucasian female patient and seen here upon a referral on 05/29/2020 from PCP.  Chief concern according to patient : see above    I have the pleasure of seeing [name] today, a right-handed White or Caucasian female with headaches- chronic migraines , tension- status migrainosus.  She has a past medical history of Anxiety, Asthma, Bacterial vaginitis, Chronic kidney disease, Complication of anesthesia, Frequency of urination, GERD (gastroesophageal reflux disease), Headache, Heart murmur, History of pre-eclampsia, Hyperemesis, Hypothyroidism, Interstitial cystitis, LGSIL on Pap smear of cervix, Mental disorder, Nocturia, Pelvic pain in female, PONV (postoperative nausea and vomiting), Spotting between menses, and Urgency of urination. She presents with post partum depression. Her first pregnancy let to pre-ecclampsia and worse depression.  Her headaches got much better with her second pregnancy- couldn't take her Frova. She has tried other triptans.  She has tried topiramate but did not feel much of an improvement from this, due to her childbearing age she should not be on Depakote, she has not been on the beta-blocker or calcium channel blocker as a preventive medication.  She has basically used Frova as an acute pain medication but is not on a preventive.  Her mood got much better on  Celexa but she did not want to take a daily medication.  She is now no longer nursing and we should certainly consider resuming an SSRI. She was placed on phentermine to lose weight ( O B), which keeps her alert.    Social history:  Patient is working 3 days a week with 2 young children. and lives in a household with 4 persons.  Tobacco use: none .  ETOH use ; rare   Caffeine intake in form of Coffee( 1-2/day) Soda( /) Tea ( / ) or energy drinks. Regular exercise in form of  Cardio exercises   Hobbies : kids.     Sleep habits are as follows: The patient's dinner time is between 4.30 PM. The patient goes to bed at 9.30 PM and continues to sleep for 3 hours, wakes because of the baby and toddler.    In the past, headaches have woken her from sleep.  The preferred sleep position is variable ,  She is not snoring, with the support of 1 pillow. Dreams are reportedly frequent/vivid.  5.30   AM is the usual rise time. The patient wakes up spontaneously.  She reports not feeling refreshed or restored in AM, with symptoms such as dry mouth morning headaches are arising during daytime, stress.     Review of Systems: Out of a complete 14 system review, the patient complains of only the following symptoms, and all other reviewed systems are negative.:  Fatigue- fragmented sleep, Insomnia   How likely  are you to doze in the following situations: 0 = not likely, 1 = slight chance, 2 = moderate chance, 3 = high chance   Sitting and Reading? Watching Television? Sitting inactive in a public place (theater or meeting)? As a passenger in a car for an hour without a break? Lying down in the afternoon when circumstances permit? Sitting and talking to someone? Sitting quietly after lunch without alcohol? In a car, while stopped for a few minutes in traffic?   Total = 12/ 24 points   FSS endorsed at 40/ 63 points.   Social History   Socioeconomic History  . Marital status: Married    Spouse name:  Not on file  . Number of children: Not on file  . Years of education: Not on file  . Highest education level: Not on file  Occupational History  . Not on file  Tobacco Use  . Smoking status: Former Smoker    Packs/day: 0.15    Years: 0.50    Pack years: 0.07    Types: Cigarettes    Quit date: 02/25/2011    Years since quitting: 9.2  . Smokeless tobacco: Never Used  Substance and Sexual Activity  . Alcohol use: No  . Drug use: No  . Sexual activity: Yes    Birth control/protection: None, Abstinence  Other Topics Concern  . Not on file  Social History Narrative  . Not on file   Social Determinants of Health   Financial Resource Strain:   . Difficulty of Paying Living Expenses: Not on file  Food Insecurity:   . Worried About Charity fundraiser in the Last Year: Not on file  . Ran Out of Food in the Last Year: Not on file  Transportation Needs:   . Lack of Transportation (Medical): Not on file  . Lack of Transportation (Non-Medical): Not on file  Physical Activity:   . Days of Exercise per Week: Not on file  . Minutes of Exercise per Session: Not on file  Stress:   . Feeling of Stress : Not on file  Social Connections:   . Frequency of Communication with Friends and Family: Not on file  . Frequency of Social Gatherings with Friends and Family: Not on file  . Attends Religious Services: Not on file  . Active Member of Clubs or Organizations: Not on file  . Attends Archivist Meetings: Not on file  . Marital Status: Not on file    No family history on file.  Past Medical History:  Diagnosis Date  . Anxiety   . Asthma    CHILDHOOD  . Bacterial vaginitis   . Chronic kidney disease    INTERTERSIAL CYSTITIS  . Complication of anesthesia    has concerns from anesthesia from last CS, breathing problems  . Frequency of urination   . GERD (gastroesophageal reflux disease)   . Headache    MIGRAINES   . Heart murmur   . History of pre-eclampsia   .  Hyperemesis   . Hypothyroidism    no longer needs medication  . Interstitial cystitis   . LGSIL on Pap smear of cervix   . Mental disorder   . Nocturia   . Pelvic pain in female   . PONV (postoperative nausea and vomiting)   . Spotting between menses   . Urgency of urination     Past Surgical History:  Procedure Laterality Date  . CESAREAN SECTION N/A 07/05/2017   Procedure: CESAREAN SECTION;  Surgeon:  Brien Few, MD;  Location: [facility];  Service: Obstetrics;  Laterality: N/A;  . CESAREAN SECTION WITH BILATERAL TUBAL LIGATION N/A 11/21/2019   Procedure: CESAREAN SECTION WITH BILATERAL TUBAL LIGATION;  Surgeon: Brien Few, MD;  Location: [facility];  Service: Obstetrics;  Laterality: N/A;  . CHROMOPERTUBATION Bilateral 07/23/2016   Procedure: CHROMOPERTUBATION;  Surgeon: Brien Few, MD;  Location: [facility];  Service: Gynecology;  Laterality: Bilateral;  . COLPOSCOPY    . CYSTO WITH HYDRODISTENSION  10/14/2011   Procedure: CYSTOSCOPY/HYDRODISTENSION;  Surgeon: Malka So, MD;  Location: [facility];  Service: Urology;  Laterality: N/A;  [place] AND CURETTAGE OF UTERUS  09/16/2011   Procedure: DILATATION AND CURETTAGE;  Surgeon: Gus Height;  Location: [facility];  Service: Gynecology;  Laterality: N/A;  Endometrial Biopsy, Urethral Dilation  . DILATION AND EVACUATION N/A 02/26/2016   Procedure: DILATATION AND EVACUATION/Chromosome Analysis;  Surgeon: Brien Few, MD;  Location: [facility];  Service: Gynecology;  Laterality: N/A;  with chromosome studies   . LAPAROSCOPIC LYSIS OF ADHESIONS    . LAPAROSCOPY  09/16/2011   Procedure: LAPAROSCOPY OPERATIVE;  Surgeon: Gus Height;  Location: [facility];  Service: Gynecology;  Laterality: N/A;  . LAPAROSCOPY N/A 07/23/2016   Procedure: LAPAROSCOPY OPERATIVE, LYSIS OF ADHESIONS;  Surgeon: Brien Few, MD;  Location: [facility];  Service: Gynecology;  Laterality: N/A;  . NOSE SURGERY  2015  . SEPTOPLASTY N/A  12/24/2019   Procedure: SEPTOPLASTY;  Surgeon: [name], MD;  Location: [facility];  Service: ENT;  Laterality: N/A;  . WISDOM TOOTH EXTRACTION  2008- ORAL SURG OFFICE     Current Outpatient Medications on File Prior to Visit  Medication Sig Dispense Refill  . ALPRAZolam (XANAX) 0.5 MG tablet Take 0.5 mg by mouth daily as needed.    . butalbital-acetaminophen-caffeine (FIORICET) 50-325-40 MG tablet Take 1-2 tablets by mouth every 4 (four) hours as needed.    . diphenhydrAMINE (BENADRYL) 25 MG tablet Take 25 mg by mouth at bedtime as needed for allergies or sleep.    Marland Kitchen docusate sodium (COLACE) 100 MG capsule Take 200 mg by mouth daily.    . frovatriptan (FROVA) 2.5 MG tablet Take 2.5 mg by mouth as needed.    Marland Kitchen ibuprofen (ADVIL) 600 MG tablet Take 600 mg by mouth 3 (three) times daily as needed.    Marland Kitchen levothyroxine (SYNTHROID) 25 MCG tablet Take 25 mcg by mouth daily before breakfast.     . rizatriptan (MAXALT) 10 MG tablet Take 10 mg by mouth as needed for migraine. May repeat in 2 hours if needed    . simethicone (MYLICON) 80 MG chewable tablet Chew 1 tablet (80 mg total) by mouth 3 (three) times daily after meals. 30 tablet 0  . triamcinolone (KENALOG) 0.025 % cream Apply topically 2 (two) times daily as needed.     No current facility-administered medications on file prior to visit.    Allergies  Allergen Reactions  . Macrobid [employer] Macro] Other (See Comments)    FLU LIKE SYMPTONS  . Penicillins Hives    Has patient had a PCN reaction causing immediate rash, facial/tongue/throat swelling, SOB or lightheadedness with hypotension: yes Has patient had a PCN reaction causing severe rash involving mucus membranes or skin necrosis: no Has patient had a PCN reaction that required hospitalization no Has patient had a PCN reaction occurring within the last 10 years: yes If all of the above answers are "NO",  then may proceed with Cephalosporin use.      Physical exam:  Today's Vitals   05/29/20 1349  BP: 101/68  Pulse: (!) 101  Weight: 202 lb (91.6 kg)  Height: 5\' 5"  (1.651 m)   Body mass index is 33.61 kg/m.   Wt Readings from Last 3 Encounters:  05/29/20 202 lb (91.6 kg)  12/24/19 218 lb 7.6 oz (99.1 kg)  11/21/19 238 lb (108 kg)     Ht Readings from Last 3 Encounters:  05/29/20 5\' 5"  (1.651 m)  12/24/19 5\' 5"  (1.651 m)  11/21/19 5\' 5"  (1.651 m)      General: The patient is awake, alert and appears not in acute distress.  The patient is well groomed. Head: Normocephalic, atraumatic. Neck is supple. Mallampati 2  neck circumference:15.5  inches . Nasal airflow  patent.  Retrognathia is not  seen.  Dental status: intact  Cardiovascular:  Regular rate and cardiac rhythm by pulse,  without distended neck veins. Respiratory: Lungs are clear to auscultation.  Skin:  Without evidence of ankle edema, or rash. Trunk: The patient's posture is erect.   Neurologic exam : The patient is awake and alert, oriented to place and time.   Memory subjective described as intact.  Attention span & concentration ability appears normal.  Speech is fluent,  without  dysarthria, dysphonia or aphasia.  Mood and affect are appropriate.   Cranial nerves: no loss of smell or taste reported  Pupils are equal and briskly reactive to light. Funduscopic exam deferred.   Extraocular movements in vertical and horizontal planes were intact and without nystagmus. No Diplopia. Visual fields by finger perimetry are intact. Hearing was intact to soft voice and finger rubbing.    Facial sensation intact to fine touch.  Facial motor strength is symmetric and tongue and uvula move midline.  Neck ROM : rotation, tilt and flexion extension were normal for age and shoulder shrug was symmetrical.    Motor exam:  Symmetric bulk, tone and ROM.   Normal tone without cog wheeling, symmetric grip strength .   Sensory:  Fine touch, pinprick and vibration  were tested  and  normal.  Proprioception tested in the upper extremities was normal.   Coordination: Rapid alternating movements in the fingers/hands were of normal speed.  The Finger-to-nose maneuver was intact without evidence of ataxia, dysmetria or tremor.   Gait and station: Patient could rise unassisted from a seated position, walked without assistive device.  Stance is of normal width/ base and the patient turned with 3 steps.  Toe and heel walk were deferred.  Deep tendon reflexes: in the upper and lower extremities are symmetric and intact.  Babinski response was deferred.      After spending a total time of 45 minutes face to face and additional time for physical and neurologic examination, review of laboratory studies,  personal review of imaging studies, reports and results of other testing and review of referral information / records as far as provided in visit, I have established the following assessments:  1) [name] presents with a typical hypervigilance of her new mother recent mother, sleep interruptions, hormonal changes and also the very frequently seen postpartum depression.  She is sleep deprived. Her sleep has gotten better since her husband has resumed sleeping in the toddler's bedroom, and her headaches have improved.  So the classification of her headaches has been migraine in the past and they have indeed responded to triptan in her case to Frova.  She has no photophobia and no nausea.  She does have a tension neck component and she feels that any stimulus even if not painful to begin with evidence of triggering headache.  The trigger points were definitely related to the occipital atlantal region and paraspinal tenderness.     My Plan is to proceed with: What I would like to do 3 things #1 I would like to start Flexeril just at night as an nonaddicting muscle relaxants.  It will help to sleep actually deeper it can also reduce the triggers that ended up in  hemicrania or migraine.   #2 I have no problem with refilling Frova but I think we should also take a preventive medicine to make the onset of these headaches less frequent I would like to try either a beta-blocker or calcium channel blocker this will also help [place] panic or anxiety regulate the heart rate.  However the preventive medicine but I really like her on would be an injectable Ajovy or Emgality.   No insurance will cover this unless she has failed the more established treatments first.    #3;Last I like for her to consider resuming her Celexa it has improved her mood I think she has a fear that she could get dependent on it or that the medication would have some long-term effects on her but I think that the benefits greatly outweigh those risks.   It would make her less stress resilient.  And that I would also offer her a referral to Dr[facility].  She cannot be staying on phentermine for very long time and change to medication such as Ozempic may be indicated.  Also dietary advice and nutrition advice may help.  The patient is not nursing and has not noticed after this pregnancy.    I would like to thank Josetta Huddle, Md [address] [place],  [place] [address] for allowing me to meet with and to take care of this pleasant patient.   I plan to follow up either personally or through our NP within 3 month.    Electronically signed by: Larey Seat, MD 05/29/2020 1:50 PM  [facility] and [facility] Sleep Board certified by The [employer] of Sleep Medicine and Diplomate of the [employer] of Sleep Medicine. Board certified In Neurology through the [place], Fellow of the [employer] of Neurology. Medical Director of [employer].

## 2020-05-29 NOTE — Patient Instructions (Signed)
Cyclobenzaprine tablets What is this medicine? CYCLOBENZAPRINE (sye kloe BEN za preen) is a muscle relaxer. It is used to treat muscle pain, spasms, and stiffness. This medicine may be used for other purposes; ask your health care provider or pharmacist if you have questions. COMMON BRAND NAME(S): Fexmid, Flexeril What should I tell my health care provider before I take this medicine? They need to know if you have any of these conditions:  heart disease, irregular heartbeat, or previous heart attack  liver disease  thyroid problem  an unusual or allergic reaction to cyclobenzaprine, tricyclic antidepressants, lactose, other medicines, foods, dyes, or preservatives  pregnant or trying to get pregnant  breast-feeding How should I use this medicine? Take this medicine by mouth with a glass of water. Follow the directions on the prescription label. If this medicine upsets your stomach, take it with food or milk. Take your medicine at regular intervals. Do not take it more often than directed. Talk to your pediatrician regarding the use of this medicine in children. Special care may be needed. Overdosage: If you think you have taken too much of this medicine contact a poison control center or emergency room at once. NOTE: This medicine is only for you. Do not share this medicine with others. What if I miss a dose? If you miss a dose, take it as soon as you can. If it is almost time for your next dose, take only that dose. Do not take double or extra doses. What may interact with this medicine? Do not take this medicine with any of the following medications:  MAOIs like Carbex, Eldepryl, Marplan, Nardil, and Parnate  narcotic medicines for cough  safinamide This medicine may also interact with the following medications:  alcohol  bupropion  antihistamines for allergy, cough and cold  certain medicines for anxiety or sleep  certain medicines for bladder problems like oxybutynin,  tolterodine  certain medicines for depression like amitriptyline, fluoxetine, sertraline  certain medicines for Parkinson's disease like benztropine, trihexyphenidyl  certain medicines for seizures like phenobarbital, primidone  certain medicines for stomach problems like dicyclomine, hyoscyamine  certain medicines for travel sickness like scopolamine  general anesthetics like halothane, isoflurane, methoxyflurane, propofol  ipratropium  local anesthetics like lidocaine, pramoxine, tetracaine  medicines that relax muscles for surgery  narcotic medicines for pain  phenothiazines like chlorpromazine, mesoridazine, prochlorperazine, thioridazine  verapamil This list may not describe all possible interactions. Give your health care provider a list of all the medicines, herbs, non-prescription drugs, or dietary supplements you use. Also tell them if you smoke, drink alcohol, or use illegal drugs. Some items may interact with your medicine. What should I watch for while using this medicine? Tell your doctor or health care professional if your symptoms do not start to get better or if they get worse. You may get drowsy or dizzy. Do not drive, use machinery, or do anything that needs mental alertness until you know how this medicine affects you. Do not stand or sit up quickly, especially if you are an older patient. This reduces the risk of dizzy or fainting spells. Alcohol may interfere with the effect of this medicine. Avoid alcoholic drinks. If you are taking another medicine that also causes drowsiness, you may have more side effects. Give your health care provider a list of all medicines you use. Your doctor will tell you how much medicine to take. Do not take more medicine than directed. Call emergency for help if you have problems breathing or unusual sleepiness.  Your mouth may get dry. Chewing sugarless gum or sucking hard candy, and drinking plenty of water may help. Contact your  doctor if the problem does not go away or is severe. What side effects may I notice from receiving this medicine? Side effects that you should report to your doctor or health care professional as soon as possible:  allergic reactions like skin rash, itching or hives, swelling of the face, lips, or tongue  breathing problems  chest pain  fast, irregular heartbeat  hallucinations  seizures  unusually weak or tired Side effects that usually do not require medical attention (report to your doctor or health care professional if they continue or are bothersome):  headache  nausea, vomiting This list may not describe all possible side effects. Call your doctor for medical advice about side effects. You may report side effects to FDA at 1-800-FDA-1088. Where should I keep my medicine? Keep out of the reach of children. Store at room temperature between 15 and 30 degrees C (59 and 86 degrees F). Keep container tightly closed. Throw away any unused medicine after the expiration date. NOTE: This sheet is a summary. It may not cover all possible information. If you have questions about this medicine, talk to your doctor, pharmacist, or health care provider.  2020 Elsevier/Gold Standard (2018-08-02 12:49:26) Migraine Headache A migraine headache is an intense, throbbing pain on one side or both sides of the head. Migraine headaches may also cause other symptoms, such as nausea, vomiting, and sensitivity to light and noise. A migraine headache can last from 4 hours to 3 days. Talk with your doctor about what things may bring on (trigger) your migraine headaches. What are the causes? The exact cause of this condition is not known. However, a migraine may be caused when nerves in the brain become irritated and release chemicals that cause inflammation of blood vessels. This inflammation causes pain. This condition may be triggered or caused by:  Drinking alcohol.  Smoking.  Taking medicines,  such as: ? Medicine used to treat chest pain (nitroglycerin). ? Birth control pills. ? Estrogen. ? Certain blood pressure medicines.  Eating or drinking products that contain nitrates, glutamate, aspartame, or tyramine. Aged cheeses, chocolate, or caffeine may also be triggers.  Doing physical activity. Other things that may trigger a migraine headache include:  Menstruation.  Pregnancy.  Hunger.  Stress.  Lack of sleep or too much sleep.  Weather changes.  Fatigue. What increases the risk? The following factors may make you more likely to experience migraine headaches:  Being a certain age. This condition is more common in people who are 31-40 years old.  Being female.  Having a family history of migraine headaches.  Being Caucasian.  Having a mental health condition, such as depression or anxiety.  Being obese. What are the signs or symptoms? The main symptom of this condition is pulsating or throbbing pain. This pain may:  Happen in any area of the head, such as on one side or both sides.  Interfere with daily activities.  Get worse with physical activity.  Get worse with exposure to bright lights or loud noises. Other symptoms may include:  Nausea.  Vomiting.  Dizziness.  General sensitivity to bright lights, loud noises, or smells. Before you get a migraine headache, you may get warning signs (an aura). An aura may include:  Seeing flashing lights or having blind spots.  Seeing bright spots, halos, or zigzag lines.  Having tunnel vision or blurred vision.  Having  numbness or a tingling feeling.  Having trouble talking.  Having muscle weakness. Some people have symptoms after a migraine headache (postdromal phase), such as:  Feeling tired.  Difficulty concentrating. How is this diagnosed? A migraine headache can be diagnosed based on:  Your symptoms.  A physical exam.  Tests, such as: ? CT scan or an MRI of the head. These imaging  tests can help rule out other causes of headaches. ? Taking fluid from the spine (lumbar puncture) and analyzing it (cerebrospinal fluid analysis, or CSF analysis). How is this treated? This condition may be treated with medicines that:  Relieve pain.  Relieve nausea.  Prevent migraine headaches. Treatment for this condition may also include:  Acupuncture.  Lifestyle changes like avoiding foods that trigger migraine headaches.  Biofeedback.  Cognitive behavioral therapy. Follow these instructions at home: Medicines  Take over-the-counter and prescription medicines only as told by your health care provider.  Ask your health care provider if the medicine prescribed to you: ? Requires you to avoid driving or using heavy machinery. ? Can cause constipation. You may need to take these actions to prevent or treat constipation:  Drink enough fluid to keep your urine pale yellow.  Take over-the-counter or prescription medicines.  Eat foods that are high in fiber, such as beans, whole grains, and fresh fruits and vegetables.  Limit foods that are high in fat and processed sugars, such as fried or sweet foods. Lifestyle  Do not drink alcohol.  Do not use any products that contain nicotine or tobacco, such as cigarettes, e-cigarettes, and chewing tobacco. If you need help quitting, ask your health care provider.  Get at least 8 hours of sleep every night.  Find ways to manage stress, such as meditation, deep breathing, or yoga. General instructions      Keep a journal to find out what may trigger your migraine headaches. For example, write down: ? What you eat and drink. ? How much sleep you get. ? Any change to your diet or medicines.  If you have a migraine headache: ? Avoid things that make your symptoms worse, such as bright lights. ? It may help to lie down in a dark, quiet room. ? Do not drive or use heavy machinery. ? Ask your health care provider what activities  are safe for you while you are experiencing symptoms.  Keep all follow-up visits as told by your health care provider. This is important. Contact a health care provider if:  You develop symptoms that are different or more severe than your usual migraine headache symptoms.  You have more than 15 headache days in one month. Get help right away if:  Your migraine headache becomes severe.  Your migraine headache lasts longer than 72 hours.  You have a fever.  You have a stiff neck.  You have vision loss.  Your muscles feel weak or like you cannot control them.  You start to lose your balance often.  You have trouble walking.  You faint.  You have a seizure. Summary  A migraine headache is an intense, throbbing pain on one side or both sides of the head. Migraines may also cause other symptoms, such as nausea, vomiting, and sensitivity to light and noise.  This condition may be treated with medicines and lifestyle changes. You may also need to avoid certain things that trigger a migraine headache.  Keep a journal to find out what may trigger your migraine headaches.  Contact your health care provider if  you have more than 15 headache days in a month or you develop symptoms that are different or more severe than your usual migraine headache symptoms. This information is not intended to replace advice given to you by your health care provider. Make sure you discuss any questions you have with your health care provider. Document Revised: 12/22/2018 Document Reviewed: 10/12/2018 Elsevier Patient Education  [place]. Tension Headache, Adult A tension headache is a feeling of pain, pressure, or aching in the head that is often felt over the front and sides of the head. The pain can be dull, or it can feel tight (constricting). There are two types of tension headache:  Episodic tension headache. This is when the headaches happen fewer than 15 days a month.  Chronic tension  headache. This is when the headaches happen more than 15 days a month during a 23-month period. A tension headache can last from 30 minutes to several days. It is the most common kind of headache. Tension headaches are not normally associated with nausea or vomiting, and they do not get worse with physical activity. What are the causes? The exact cause of this condition is not known. Tension headaches are often triggered by stress, anxiety, or depression. Other triggers include:  Alcohol.  Too much caffeine or caffeine withdrawal.  Respiratory infections, such as colds, flu, or sinus infections.  Dental problems or teeth clenching.  Tiredness (fatigue).  Holding your head and neck in the same position for a long period of time, such as while using a computer.  Smoking.  Arthritis of the neck. What are the signs or symptoms? Symptoms of this condition include:  A feeling of pressure or tightness around the head.  Dull, aching head pain.  Pain over the front and sides of the head.  Tenderness in the muscles of the head, neck, and shoulders. How is this diagnosed? This condition may be diagnosed based on your symptoms, your medical history, and a physical exam. If your symptoms are severe or unusual, you may have imaging tests, such as a CT scan or an MRI of your head. Your vision may also be checked. How is this treated? This condition may be treated with lifestyle changes and with medicines that help relieve symptoms. Follow these instructions at home: Managing pain  Take over-the-counter and prescription medicines only as told by your health care provider.  When you have a headache, lie down in a dark, quiet room.  If directed, apply ice to the head and neck: ? Put ice in a plastic bag. ? Place a towel between your skin and the bag. ? Leave the ice on for 20 minutes, 2-3 times a day.  If directed, apply heat to the back of your neck as often as told by your health care  provider. Use the heat source that your health care provider recommends, such as a moist heat pack or a heating pad. ? Place a towel between your skin and the heat source. ? Leave the heat on for 20-30 minutes. ? Remove the heat if your skin turns bright red. This is especially important if you are unable to feel pain, heat, or cold. You may have a greater risk of getting burned. Eating and drinking  Eat meals on a regular schedule.  Limit alcohol intake to no more than 1 drink a day for nonpregnant women and 2 drinks a day for men. One drink equals 12 oz of beer, 5 oz of wine, or 1 oz  of hard liquor.  Drink enough fluid to keep your urine pale yellow.  Decrease your caffeine intake, or stop using caffeine. Lifestyle  Get 7-9 hours of sleep each night, or get the amount of sleep recommended by your health care provider.  At bedtime, remove all electronic devices from your room. Electronic devices include computers, phones, and tablets.  Find ways to manage your stress. Some things that can help relieve stress include: ? Exercise. ? Deep breathing exercises. ? Yoga. ? Listening to music. ? Positive mental imagery.  Try to sit up straight and avoid tensing your muscles.  Do not use any products that contain nicotine or tobacco, such as cigarettes and e-cigarettes. If you need help quitting, ask your health care provider. General instructions   Keep all follow-up visits as told by your health care provider. This is important.  Avoid any headache triggers. Keep a headache journal to help find out what may trigger your headaches. For example, write down: ? What you eat and drink. ? How much sleep you get. ? Any change to your diet or medicines. Contact a health care provider if:  Your headache does not get better.  Your headache comes back.  You are sensitive to sounds, light, or smells because of a headache.  You have nausea or you vomit.  Your stomach hurts. Get help  right away if:  You suddenly develop a very severe headache along with any of the following: ? A stiff neck. ? Nausea and vomiting. ? Confusion. ? Weakness. ? Double vision or loss of vision. ? Shortness of breath. ? Rash. ? Unusual sleepiness. ? Fever. ? Trouble speaking. ? Pain in your eyes or ears. ? Trouble walking or balancing. ? Feeling faint or passing out. Summary  A tension headache is a feeling of pain, pressure, or aching in the head that is often felt over the front and sides of the head.  A tension headache can last from 30 minutes to several days. It is the most common kind of headache.  This condition may be diagnosed based on your symptoms, your medical history, and a physical exam.  This condition may be treated with lifestyle changes and with medicines that help relieve symptoms. This information is not intended to replace advice given to you by your health care provider. Make sure you discuss any questions you have with your health care provider. Document Revised: 06/27/2019 Document Reviewed: 12/10/2016 Elsevier Patient Education  [place].

## 2020-05-30 ENCOUNTER — Ambulatory Visit
Admission: RE | Admit: 2020-05-30 | Discharge: 2020-05-30 | Disposition: A | Discharge status: Home / Self Care | Payer: 59 | Source: Ambulatory Visit | Attending: Neurology | Admitting: Neurology

## 2020-05-30 ENCOUNTER — Other Ambulatory Visit: Payer: Self-pay

## 2020-05-30 DIAGNOSIS — O99345 Other mental disorders complicating the puerperium: Secondary | ICD-10-CM | POA: Diagnosis not present

## 2020-05-30 DIAGNOSIS — G44049 Chronic paroxysmal hemicrania, not intractable: Secondary | ICD-10-CM | POA: Insufficient documentation

## 2020-05-30 DIAGNOSIS — M542 Cervicalgia: Secondary | ICD-10-CM | POA: Diagnosis not present

## 2020-05-30 DIAGNOSIS — F53 Postpartum depression: Secondary | ICD-10-CM | POA: Insufficient documentation

## 2020-05-30 DIAGNOSIS — R519 Headache, unspecified: Secondary | ICD-10-CM | POA: Diagnosis not present

## 2020-05-30 MED ORDER — GADOBUTROL 1 MMOL/ML IV SOLN
9.0000 mL | Freq: Once | INTRAVENOUS | Status: AC | PRN
  Administered 2020-05-30: 9 mL via INTRAVENOUS

## 2020-06-02 ENCOUNTER — Encounter: Payer: Self-pay | Admitting: Neurology

## 2020-06-02 ENCOUNTER — Ambulatory Visit: Payer: 59

## 2020-06-02 ENCOUNTER — Encounter (INDEPENDENT_AMBULATORY_CARE_PROVIDER_SITE_OTHER): Payer: Self-pay

## 2020-06-02 MED FILL — PHENTERMINE 37.5 MG TABLET: 37.5 | 30 days supply | Qty: 30 | Fill #1

## 2020-06-02 NOTE — Progress Notes (Signed)
SKULL AND UPPER CERVICAL SPINE: Normal calvarium and skull base. Visualized upper cervical spine and soft tissues are normal.  SINUSES/ORBITS: No paranasal sinus fluid levels or advanced mucosal thickening. No mastoid or middle ear effusion. Normal orbits.  IMPRESSION: Normal brain MRI. No sinusitis.    Electronically Signed   By: Ulyses Jarred M.D.   On: 05/31/2020 04:03

## 2020-06-03 ENCOUNTER — Other Ambulatory Visit: Payer: Self-pay | Admitting: Obstetrics and Gynecology

## 2020-06-04 NOTE — Telephone Encounter (Signed)
Dohmeier, Ashley Partridge, MD 1 hour ago (1:14 PM)  CD   Lets just take the single night time dose of the betablocker , once you get used to it we can later increase to bid . CD      I called the patient and provided her with the information above. She verbalized understanding and  was in agreement to reduced her dosage for now. She will attempt to increase it back up once she is use to the medications. If her side effects continue, she was advised to reach back out to our office.

## 2020-06-04 NOTE — Telephone Encounter (Signed)
Lets just take the single night time dose of the betablocker , once you get used to it we can later increase to bid . CD

## 2020-06-12 MED FILL — BUTALB-ACETAMIN-CAFF 50-325: 50-325-40 | 5 days supply | Qty: 30 | Fill #0

## 2020-06-17 ENCOUNTER — Encounter: Payer: Self-pay | Admitting: Internal Medicine

## 2020-06-17 ENCOUNTER — Ambulatory Visit (INDEPENDENT_AMBULATORY_CARE_PROVIDER_SITE_OTHER): Payer: 59 | Admitting: Internal Medicine

## 2020-06-17 VITALS — BP 110/82 | HR 112 | Ht 65.0 in | Wt 206.6 lb

## 2020-06-17 DIAGNOSIS — K644 Residual hemorrhoidal skin tags: Secondary | ICD-10-CM | POA: Diagnosis not present

## 2020-06-17 DIAGNOSIS — K582 Mixed irritable bowel syndrome: Secondary | ICD-10-CM

## 2020-06-17 DIAGNOSIS — K601 Chronic anal fissure: Secondary | ICD-10-CM | POA: Diagnosis not present

## 2020-06-17 MED ORDER — AMBULATORY NON FORMULARY MEDICATION: 2 refills | Status: AC

## 2020-06-17 NOTE — Progress Notes (Signed)
[name] 34 y.o. 02-Feb-1986 [id number] Referred by: Josetta Huddle, MD Brien Few, MD Assessment & Plan:   Encounter Diagnoses  Name Primary?  . Chronic anal fissure Yes  . Anal skin tag   . Irritable bowel syndrome with both constipation and diarrhea     I think some of her symptoms are likely from an anal fissure.  The tags or tags are small and I understand why surgery was not recommended.  It seems like she has IBS which is very common in patients with interstitial cystitis though it is not symptomatic right now.  It certainly may have contributed to the anorectal symptoms through the constipation and diarrhea problems as well as the fissure.  We had a good conversation today and the plan is for:  2% diltiazem/5% lidocaine cream or gel twice daily to 3 times daily into the anal canal Sitz bath as possible Return to clinic in 2 months for reassessment  I appreciate the opportunity to care for this patient. CC: Josetta Huddle, MD Dr. 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  It developed during pregnancy and was a big problem around the time of delivery March 2021 she also had alternating diarrhea and constipation sometime after that.  The diarrhea and constipation have resolved and "my bowels are good now".  Is using a stool softener with help.  Dr. Ronita Hipps encouraged her to be seen regarding possible removal of the anal tag since it was bothering her.  She was seen by Dr. Marcello Moores of surgery who recommended not removing the anal tag and being careful with wiping etc.  The patient is carefully wiping and using wet wipes but she continues to have problems with soreness and pain not really rectal bleeding anymore that was only on the toilet paper before.  There is pain with  defecation and sitting in this is sort of an aching pain there.  She has tried generic prescription hydrocortisone cream but that has not really helped.  She has a history of interstitial cystitis and that has not been bothering her for some time she says pregnancies seem to have helped. Allergies  Allergen Reactions  . Macrobid [employer] Macro] Other (See Comments)    FLU LIKE SYMPTONS  . Penicillins Hives    Has patient had a PCN reaction causing immediate rash, facial/tongue/throat swelling, SOB or lightheadedness with hypotension: yes Has patient had a PCN reaction causing severe rash involving mucus membranes or skin necrosis: no Has patient had a PCN reaction that required hospitalization no Has patient had a PCN reaction occurring within the last 10 years: yes If all of the above answers are "NO", then may proceed with Cephalosporin use.    Current Meds  Medication Sig  . ALPRAZolam (XANAX) 0.5 MG tablet Take 0.5 mg by mouth daily as needed.  . butalbital-acetaminophen-caffeine (FIORICET) 50-325-40 MG tablet Take 1-2 tablets by mouth every 4 (four) hours as needed.  . citalopram (CELEXA) 20 MG tablet Take 20 mg by mouth daily.  . cyclobenzaprine (FLEXERIL) 5 MG tablet Take 1 tablet (5 mg total) by mouth at bedtime.  . diphenhydrAMINE (BENADRYL) 25 MG tablet Take 25 mg by mouth at bedtime as needed for allergies or sleep.  Marland Kitchen docusate sodium (COLACE) 100 MG capsule Take 200 mg by mouth  daily.  . frovatriptan (FROVA) 2.5 MG tablet Take 2.5 mg by mouth as needed.  Marland Kitchen ibuprofen (ADVIL) 600 MG tablet Take 600 mg by mouth 3 (three) times daily as needed.  Marland Kitchen levothyroxine (SYNTHROID) 25 MCG tablet Take 25 mcg by mouth daily before breakfast.   . propranolol (INDERAL) 20 MG tablet Take 1 tablet (20 mg total) by mouth 2 (two) times daily.  . rizatriptan (MAXALT) 10 MG tablet Take 10 mg by mouth as needed for migraine. May repeat in 2 hours if needed  . triamcinolone (KENALOG)  0.025 % cream Apply topically 2 (two) times daily as needed.   Past Medical History:  Diagnosis Date  . Anxiety   . Asthma    CHILDHOOD  . Bacterial vaginitis   . Chronic kidney disease    INTERTERSIAL CYSTITIS  . Complication of anesthesia    has concerns from anesthesia from last CS, breathing problems  . Frequency of urination   . GERD (gastroesophageal reflux disease)   . Headache    MIGRAINES   . Heart murmur   . History of pre-eclampsia   . Hyperemesis   . Hypothyroidism    no longer needs medication  . Interstitial cystitis   . LGSIL on Pap smear of cervix   . Mental disorder   . Nocturia   . Pelvic pain in female   . PONV (postoperative nausea and vomiting)   . Spotting between menses   . Urgency of urination    Past Surgical History:  Procedure Laterality Date  . CESAREAN SECTION N/A 07/05/2017   Procedure: CESAREAN SECTION;  Surgeon: Brien Few, MD;  Location: [facility];  Service: Obstetrics;  Laterality: N/A;  . CESAREAN SECTION WITH BILATERAL TUBAL LIGATION N/A 11/21/2019   Procedure: CESAREAN SECTION WITH BILATERAL TUBAL LIGATION;  Surgeon: Brien Few, MD;  Location: [facility];  Service: Obstetrics;  Laterality: N/A;  . CHROMOPERTUBATION Bilateral 07/23/2016   Procedure: CHROMOPERTUBATION;  Surgeon: Brien Few, MD;  Location: [facility];  Service: Gynecology;  Laterality: Bilateral;  . COLPOSCOPY    . CYSTO WITH HYDRODISTENSION  10/14/2011   Procedure: CYSTOSCOPY/HYDRODISTENSION;  Surgeon: Malka So, MD;  Location: [facility];  Service: Urology;  Laterality: N/A;  [place] AND CURETTAGE OF UTERUS  09/16/2011   Procedure: DILATATION AND CURETTAGE;  Surgeon: Gus Height;  Location: [facility];  Service: Gynecology;  Laterality: N/A;  Endometrial Biopsy, Urethral Dilation  . DILATION AND EVACUATION N/A 02/26/2016   Procedure: DILATATION AND EVACUATION/Chromosome Analysis;  Surgeon: Brien Few, MD;  Location: [facility];   Service: Gynecology;  Laterality: N/A;  with chromosome studies   . LAPAROSCOPIC LYSIS OF ADHESIONS    . LAPAROSCOPY  09/16/2011   Procedure: LAPAROSCOPY OPERATIVE;  Surgeon: Gus Height;  Location: [facility];  Service: Gynecology;  Laterality: N/A;  . LAPAROSCOPY N/A 07/23/2016   Procedure: LAPAROSCOPY OPERATIVE, LYSIS OF ADHESIONS;  Surgeon: Brien Few, MD;  Location: [facility];  Service: Gynecology;  Laterality: N/A;  . NOSE SURGERY  2015  . SEPTOPLASTY N/A 12/24/2019   Procedure: SEPTOPLASTY;  Surgeon: [name], MD;  Location: [facility];  Service: ENT;  Laterality: N/A;  . 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   Review of Systems Some headaches some urinary frequency all other review of systems negative  Objective:   Physical Exam BP 110/82   Pulse (!) 112   Ht 5\' 5"  (1.651 m)   Wt 206 lb 9.6 oz (93.7 kg)   SpO2 99%   BMI 34.38 kg/m  Well-developed well-nourished obese white woman in no acute distress Appropriate mood and affect Alert and oriented x3  [name], CMA present.  Rectal exam reveals a small anterior tag that is fleshy and slightly tender or sore, there is a smaller posterior tag with a small sentinel pile suggestive of a fissure.  She is tender both anteriorly more so than posteriorly or on exam.  There is a very small amount of erythema in the gluteal crease.   Anoscopy is performed and demonstrates a small posterior anal fissure and some slightly inflamed external hemorrhoids.  I do not see an anterior fissure where she was more tender.

## 2020-06-17 NOTE — Patient Instructions (Signed)
Your provider has prescribed Diltiazem / Lidocaine gel for you. Please follow the directions written on your prescription bottle or given to you specifically by your provider. Since this is a specialty medication and is not readily available at most local pharmacies, we have sent your prescription to:  [facility] information is below: Address: [address], [place], [place] [address]  [phone]  *Please DO NOT go directly from our office to pick up this medication! Give the pharmacy 1 day to process the prescription as this is compounded and takes time to make.  We are giving you information to read on Sitz baths and anal fissures.  I appreciate the opportunity to care for you. Silvano Rusk, MD, [facility]

## 2020-06-23 MED FILL — FROVATRIPTAN SUCCINATE 2.5: 2.5 | 30 days supply | Qty: 15 | Fill #2

## 2020-06-23 MED FILL — ALPRAZolam 0.5 MG TABS: 0.5 | 10 days supply | Qty: 10 | Fill #1

## 2020-06-23 MED FILL — PROPRANOLOL 20 MG TABLET: 20 | 30 days supply | Qty: 60 | Fill #1

## 2020-06-23 MED FILL — CYCLOBENZAPRINE HCL 5 MG TA: 5 | 30 days supply | Qty: 30 | Fill #1

## 2020-06-23 MED FILL — CITALOPRAM HBR 10 MG TABLET: 10 | 30 days supply | Qty: 30 | Fill #4

## 2020-06-23 MED FILL — IBUPROFEN 600 MG TABLET: 600 | 30 days supply | Qty: 90 | Fill #1

## 2020-06-25 ENCOUNTER — Encounter (HOSPITAL_BASED_OUTPATIENT_CLINIC_OR_DEPARTMENT_OTHER): Payer: Self-pay | Admitting: Obstetrics and Gynecology

## 2020-06-25 ENCOUNTER — Other Ambulatory Visit: Payer: Self-pay

## 2020-06-25 NOTE — Progress Notes (Signed)
Spoke w/ via phone for pre-op interview---pt Lab needs dos---- cbc, t & s, urine preg              Lab results------none COVID test -----06-30-2020 1435- Arrive at -------845  Am 07-03-2020 NPO after MN NO Solid Food.  Clear liquids from MN until---745 am then npo Medications to take morning of surgery -----alprazolamn prn, propranolol, levothyroxine Diabetic medication -----n/a Patient Special Instructions -----none Pre-Op special Istructions -----none Patient verbalized understanding of instructions that were given at this phone interview. Patient denies shortness of breath, chest pain, fever, cough at this phone interview.

## 2020-06-26 DIAGNOSIS — E039 Hypothyroidism, unspecified: Secondary | ICD-10-CM | POA: Diagnosis not present

## 2020-06-26 DIAGNOSIS — R519 Headache, unspecified: Secondary | ICD-10-CM | POA: Diagnosis not present

## 2020-06-26 DIAGNOSIS — E559 Vitamin D deficiency, unspecified: Secondary | ICD-10-CM | POA: Diagnosis not present

## 2020-06-26 DIAGNOSIS — G43009 Migraine without aura, not intractable, without status migrainosus: Secondary | ICD-10-CM | POA: Diagnosis not present

## 2020-06-26 DIAGNOSIS — F419 Anxiety disorder, unspecified: Secondary | ICD-10-CM | POA: Diagnosis not present

## 2020-06-26 DIAGNOSIS — F53 Postpartum depression: Secondary | ICD-10-CM | POA: Diagnosis not present

## 2020-06-30 ENCOUNTER — Other Ambulatory Visit (HOSPITAL_COMMUNITY)
Admission: RE | Admit: 2020-06-30 | Discharge: 2020-06-30 | Disposition: A | Discharge status: Home / Self Care | Payer: 59 | Source: Ambulatory Visit | Attending: Obstetrics and Gynecology | Admitting: Obstetrics and Gynecology

## 2020-06-30 DIAGNOSIS — Z20822 Contact with and (suspected) exposure to covid-19: Secondary | ICD-10-CM | POA: Insufficient documentation

## 2020-06-30 DIAGNOSIS — Z01812 Encounter for preprocedural laboratory examination: Secondary | ICD-10-CM | POA: Diagnosis not present

## 2020-06-30 LAB — SARS CORONAVIRUS 2 (TAT 6-24 HRS): SARS Coronavirus 2: NEGATIVE

## 2020-07-02 MED FILL — PHENTERMINE 37.5 MG TABLET: 37.5 | 30 days supply | Qty: 30 | Fill #2

## 2020-07-03 ENCOUNTER — Other Ambulatory Visit: Payer: Self-pay

## 2020-07-03 ENCOUNTER — Ambulatory Visit (HOSPITAL_BASED_OUTPATIENT_CLINIC_OR_DEPARTMENT_OTHER)
Admission: RE | Admit: 2020-07-03 | Discharge: 2020-07-03 | Disposition: A | Discharge status: Home / Self Care | Payer: 59 | Attending: Obstetrics and Gynecology | Admitting: Obstetrics and Gynecology

## 2020-07-03 ENCOUNTER — Ambulatory Visit (HOSPITAL_BASED_OUTPATIENT_CLINIC_OR_DEPARTMENT_OTHER): Payer: 59 | Admitting: Anesthesiology

## 2020-07-03 ENCOUNTER — Other Ambulatory Visit (HOSPITAL_COMMUNITY): Payer: Self-pay | Admitting: Obstetrics and Gynecology

## 2020-07-03 ENCOUNTER — Encounter (HOSPITAL_BASED_OUTPATIENT_CLINIC_OR_DEPARTMENT_OTHER): Payer: Self-pay | Admitting: Obstetrics and Gynecology

## 2020-07-03 ENCOUNTER — Encounter (HOSPITAL_BASED_OUTPATIENT_CLINIC_OR_DEPARTMENT_OTHER)
Admission: RE | Disposition: A | Discharge status: Home / Self Care | Payer: Self-pay | Source: Home / Self Care | Attending: Obstetrics and Gynecology

## 2020-07-03 DIAGNOSIS — Z87891 Personal history of nicotine dependence: Secondary | ICD-10-CM | POA: Diagnosis not present

## 2020-07-03 DIAGNOSIS — N189 Chronic kidney disease, unspecified: Secondary | ICD-10-CM | POA: Insufficient documentation

## 2020-07-03 DIAGNOSIS — F419 Anxiety disorder, unspecified: Secondary | ICD-10-CM | POA: Diagnosis not present

## 2020-07-03 DIAGNOSIS — N840 Polyp of corpus uteri: Secondary | ICD-10-CM | POA: Insufficient documentation

## 2020-07-03 DIAGNOSIS — J45909 Unspecified asthma, uncomplicated: Secondary | ICD-10-CM | POA: Diagnosis not present

## 2020-07-03 DIAGNOSIS — Z880 Allergy status to penicillin: Secondary | ICD-10-CM | POA: Diagnosis not present

## 2020-07-03 DIAGNOSIS — K219 Gastro-esophageal reflux disease without esophagitis: Secondary | ICD-10-CM | POA: Diagnosis not present

## 2020-07-03 DIAGNOSIS — N920 Excessive and frequent menstruation with regular cycle: Secondary | ICD-10-CM | POA: Diagnosis not present

## 2020-07-03 DIAGNOSIS — E039 Hypothyroidism, unspecified: Secondary | ICD-10-CM | POA: Diagnosis not present

## 2020-07-03 DIAGNOSIS — Z881 Allergy status to other antibiotic agents status: Secondary | ICD-10-CM | POA: Diagnosis not present

## 2020-07-03 HISTORY — PX: DILITATION & CURRETTAGE/HYSTROSCOPY WITH NOVASURE ABLATION: SHX5568

## 2020-07-03 LAB — TYPE AND SCREEN
ABO/RH(D): O POS
Antibody Screen: NEGATIVE

## 2020-07-03 LAB — CBC
HCT: 42.6 % (ref 36.0–46.0)
Hemoglobin: 14.2 g/dL (ref 12.0–15.0)
MCH: 30.3 pg (ref 26.0–34.0)
MCHC: 33.3 g/dL (ref 30.0–36.0)
MCV: 90.8 fL (ref 80.0–100.0)
Platelets: 410 10*3/uL — ABNORMAL HIGH (ref 150–400)
RBC: 4.69 MIL/uL (ref 3.87–5.11)
RDW: 13 % (ref 11.5–15.5)
WBC: 8.2 10*3/uL (ref 4.0–10.5)
nRBC: 0 % (ref 0.0–0.2)

## 2020-07-03 LAB — POCT PREGNANCY, URINE: Preg Test, Ur: NEGATIVE

## 2020-07-03 SURGERY — DILATATION & CURETTAGE/HYSTEROSCOPY WITH NOVASURE ABLATION
Anesthesia: General | Site: Vagina

## 2020-07-03 MED ORDER — DEXAMETHASONE SODIUM PHOSPHATE 10 MG/ML IJ SOLN
INTRAMUSCULAR | Status: DC | PRN
  Administered 2020-07-03 (×2): 5 mg via INTRAVENOUS

## 2020-07-03 MED ORDER — PROPOFOL 10 MG/ML IV BOLUS
INTRAVENOUS | Status: AC
  Filled 2020-07-03: qty 20

## 2020-07-03 MED ORDER — FENTANYL CITRATE (PF) 100 MCG/2ML IJ SOLN
INTRAMUSCULAR | Status: AC
  Filled 2020-07-03: qty 2

## 2020-07-03 MED ORDER — SCOPOLAMINE 1 MG/3DAYS TD PT72
1.0000 | MEDICATED_PATCH | TRANSDERMAL | Status: DC
  Administered 2020-07-03: 1.5 mg via TRANSDERMAL

## 2020-07-03 MED ORDER — DEXAMETHASONE SODIUM PHOSPHATE 10 MG/ML IJ SOLN
INTRAMUSCULAR | Status: AC
  Filled 2020-07-03: qty 1

## 2020-07-03 MED ORDER — OXYCODONE HCL 5 MG PO TABS: 5.0000 mg | ORAL_TABLET | Freq: Once | ORAL | Status: DC | PRN

## 2020-07-03 MED ORDER — POVIDONE-IODINE 10 % EX SWAB: 2.0000 "application " | Freq: Once | CUTANEOUS | Status: DC

## 2020-07-03 MED ORDER — FENTANYL CITRATE (PF) 100 MCG/2ML IJ SOLN
25.0000 ug | INTRAMUSCULAR | Status: DC | PRN
  Administered 2020-07-03: 25 ug via INTRAVENOUS
  Administered 2020-07-03: 50 ug via INTRAVENOUS

## 2020-07-03 MED ORDER — TRAMADOL HCL 50 MG PO TABS: 50.0000 mg | ORAL_TABLET | Freq: Four times a day (QID) | ORAL | 0 refills | Status: DC | PRN

## 2020-07-03 MED ORDER — LIDOCAINE 2% (20 MG/ML) 5 ML SYRINGE
INTRAMUSCULAR | Status: DC | PRN
  Administered 2020-07-03: 100 mg via INTRAVENOUS

## 2020-07-03 MED ORDER — SODIUM CHLORIDE 0.9 % IR SOLN
Status: DC | PRN
  Administered 2020-07-03: 600 mL

## 2020-07-03 MED ORDER — SCOPOLAMINE 1 MG/3DAYS TD PT72
MEDICATED_PATCH | TRANSDERMAL | Status: AC
  Filled 2020-07-03: qty 1

## 2020-07-03 MED ORDER — PROPOFOL 10 MG/ML IV BOLUS
INTRAVENOUS | Status: DC | PRN
  Administered 2020-07-03: 200 mg via INTRAVENOUS

## 2020-07-03 MED ORDER — CEFAZOLIN SODIUM-DEXTROSE 2-4 GM/100ML-% IV SOLN
2.0000 g | INTRAVENOUS | Status: AC
  Administered 2020-07-03: 2 g via INTRAVENOUS

## 2020-07-03 MED ORDER — MIDAZOLAM HCL 2 MG/2ML IJ SOLN
INTRAMUSCULAR | Status: AC
  Filled 2020-07-03: qty 2

## 2020-07-03 MED ORDER — ONDANSETRON HCL 4 MG/2ML IJ SOLN
INTRAMUSCULAR | Status: AC
  Filled 2020-07-03: qty 2

## 2020-07-03 MED ORDER — KETOROLAC TROMETHAMINE 30 MG/ML IJ SOLN
INTRAMUSCULAR | Status: AC
  Filled 2020-07-03: qty 1

## 2020-07-03 MED ORDER — ONDANSETRON HCL 4 MG/2ML IJ SOLN
INTRAMUSCULAR | Status: DC | PRN
  Administered 2020-07-03: 4 mg via INTRAVENOUS

## 2020-07-03 MED ORDER — WHITE PETROLATUM EX OINT
TOPICAL_OINTMENT | CUTANEOUS | Status: AC
  Filled 2020-07-03: qty 5

## 2020-07-03 MED ORDER — KETOROLAC TROMETHAMINE 30 MG/ML IJ SOLN
INTRAMUSCULAR | Status: DC | PRN
  Administered 2020-07-03: 30 mg via INTRAVENOUS

## 2020-07-03 MED ORDER — KETOROLAC TROMETHAMINE 30 MG/ML IJ SOLN: 30.0000 mg | Freq: Once | INTRAMUSCULAR | Status: DC | PRN

## 2020-07-03 MED ORDER — LACTATED RINGERS IV SOLN: INTRAVENOUS | Status: DC

## 2020-07-03 MED ORDER — LIDOCAINE 2% (20 MG/ML) 5 ML SYRINGE
INTRAMUSCULAR | Status: AC
  Filled 2020-07-03: qty 5

## 2020-07-03 MED ORDER — OXYCODONE HCL 5 MG/5ML PO SOLN: 5.0000 mg | Freq: Once | ORAL | Status: DC | PRN

## 2020-07-03 MED ORDER — VASOPRESSIN 20 UNIT/ML IV SOLN
INTRAVENOUS | Status: DC | PRN
  Administered 2020-07-03: 18 mL via INTRAMUSCULAR

## 2020-07-03 MED ORDER — CEFAZOLIN SODIUM-DEXTROSE 2-4 GM/100ML-% IV SOLN
INTRAVENOUS | Status: AC
  Filled 2020-07-03: qty 100

## 2020-07-03 MED ORDER — BUPIVACAINE HCL (PF) 0.25 % IJ SOLN
INTRAMUSCULAR | Status: DC | PRN
  Administered 2020-07-03: 20 mL

## 2020-07-03 MED ORDER — FENTANYL CITRATE (PF) 100 MCG/2ML IJ SOLN
INTRAMUSCULAR | Status: DC | PRN
  Administered 2020-07-03 (×2): 25 ug via INTRAVENOUS
  Administered 2020-07-03: 50 ug via INTRAVENOUS

## 2020-07-03 MED ORDER — MIDAZOLAM HCL 2 MG/2ML IJ SOLN
INTRAMUSCULAR | Status: DC | PRN
  Administered 2020-07-03: 2 mg via INTRAVENOUS

## 2020-07-03 MED FILL — HYDROCODON-APAP 5-325: 5-325 | 3 days supply | Qty: 10 | Fill #0

## 2020-07-03 MED FILL — traMADol HCL 50 MG TABS: 50 | 3 days supply | Qty: 20 | Fill #0

## 2020-07-03 SURGICAL SUPPLY — 18 items
ABLATOR SURESOUND NOVASURE (ABLATOR) ×2 IMPLANT
CATH ROBINSON RED A/P 16FR (CATHETERS) ×2 IMPLANT
GLOVE BIO SURGEON STRL SZ7.5 (GLOVE) ×2 IMPLANT
GLOVE BIOGEL PI IND STRL 6.5 (GLOVE) IMPLANT
GLOVE BIOGEL PI IND STRL 7.0 (GLOVE) ×1 IMPLANT
GLOVE BIOGEL PI INDICATOR 6.5 (GLOVE) ×1
GLOVE BIOGEL PI INDICATOR 7.0 (GLOVE) ×1
GLOVE SKINSENSE NS SZ6.5 (GLOVE) ×1
GLOVE SKINSENSE STRL SZ6.5 (GLOVE) IMPLANT
GLOVE SURG SS PI 6.5 STRL IVOR (GLOVE) ×1 IMPLANT
GOWN STRL REUS W/ TWL LRG LVL3 (GOWN DISPOSABLE) ×2 IMPLANT
GOWN STRL REUS W/TWL LRG LVL3 (GOWN DISPOSABLE) ×4
HIBICLENS CHG 4% 4OZ (MISCELLANEOUS) ×2 IMPLANT
KIT PROCEDURE FLUENT (KITS) ×2 IMPLANT
PACK VAGINAL MINOR WOMEN LF (CUSTOM PROCEDURE TRAY) ×2 IMPLANT
PAD OB MATERNITY 4.3X12.25 (PERSONAL CARE ITEMS) ×2 IMPLANT
PAD PREP 24X48 CUFFED NSTRL (MISCELLANEOUS) ×2 IMPLANT
TOWEL OR 17X26 10 PK STRL BLUE (TOWEL DISPOSABLE) ×2 IMPLANT

## 2020-07-03 NOTE — Transfer of Care (Signed)
Immediate Anesthesia Transfer of Care Note  Patient: [name]  Procedure(s) Performed: Procedure(s) (LRB): DILATATION & CURETTAGE/HYSTEROSCOPY WITH NOVASURE ABLATION (N/A)  Patient Location: PACU  Anesthesia Type: General  Level of Consciousness: awake, oriented, sedated and patient cooperative  Airway & Oxygen Therapy: Patient Spontanous Breathing and Patient connected to face mask oxygen  Post-op Assessment: Report given to PACU RN and Post -op Vital signs reviewed and stable  Post vital signs: Reviewed and stable  Complications: No apparent anesthesia complications Last Vitals:  Vitals Value Taken Time  BP 119/84 07/03/20 1111  Temp 36.6 C 07/03/20 1111  Pulse 101 07/03/20 1113  Resp 13 07/03/20 1113  SpO2 100 % 07/03/20 1113  Vitals shown include unvalidated device data.  Last Pain:  Vitals:   07/03/20 0833  TempSrc: Oral      Patients Stated Pain Goal: 4 (60/45/40 9811)  Complications: No complications documented.

## 2020-07-03 NOTE — Anesthesia Postprocedure Evaluation (Signed)
Anesthesia Post Note  Patient: Shazia Mitchener Savarino  Procedure(s) Performed: DILATATION & CURETTAGE/HYSTEROSCOPY WITH NOVASURE ABLATION (N/A Vagina )     Patient location during evaluation: PACU Anesthesia Type: General Level of consciousness: awake and alert Pain management: pain level controlled Vital Signs Assessment: post-procedure vital signs reviewed and stable Respiratory status: spontaneous breathing, nonlabored ventilation, respiratory function stable and patient connected to nasal cannula oxygen Cardiovascular status: blood pressure returned to baseline and stable Postop Assessment: no apparent nausea or vomiting Anesthetic complications: no   No complications documented.  Last Vitals:  Vitals:   07/03/20 1115 07/03/20 1130  BP: 119/79 115/79  Pulse: (!) 108 89  Resp: 13 (!) 26  Temp:    SpO2: 100% 100%    Last Pain:  Vitals:   07/03/20 1145  TempSrc:   PainSc: 2                  Kishaun Erekson S

## 2020-07-03 NOTE — Progress Notes (Signed)
Patient seen and examined. Consent witnessed and signed. No changes noted. Update completed. BP 122/81   Pulse 81   Temp 97.7 F (36.5 C) (Oral)   Resp 18   Ht 5\' 5"  (1.651 m)   Wt 93.9 kg   LMP 06/25/2020   SpO2 99%   BMI 34.45 kg/m   CBC    Component Value Date/Time   WBC 8.2 07/03/2020 0841   RBC 4.69 07/03/2020 0841   HGB 14.2 07/03/2020 0841   HCT 42.6 07/03/2020 0841   PLT 410 (H) 07/03/2020 0841   MCV 90.8 07/03/2020 0841   MCH 30.3 07/03/2020 0841   MCHC 33.3 07/03/2020 0841   RDW 13.0 07/03/2020 0841   LYMPHSABS 2.2 11/14/2014 1612   MONOABS 0.6 11/14/2014 1612   EOSABS 0.2 11/14/2014 1612   BASOSABS 0.1 11/14/2014 1612

## 2020-07-03 NOTE — H&P (Signed)
[name] is an 34 y.o. female. Menorrhagia for surgical therapy  Pertinent Gynecological History: Menses: flow is moderate Bleeding: dysfunctional uterine bleeding Contraception: tubal ligation DES exposure: denies Blood transfusions: none Sexually transmitted diseases: no past history Previous GYN Procedures: DNC  Last mammogram: na Date: na Last pap: normal Date: 2021 OB History: G6, P2   Menstrual History: Menarche age: 34 Patient's last menstrual period was 06/25/2020.    Past Medical History:  Diagnosis Date  . Anxiety   . Asthma    CHILDHOOD  . Bacterial vaginitis   . Chronic kidney disease    INTERTERSIAL CYSTITIS  . Complication of anesthesia    has concerns from anesthesia from  first  CS, breathing problems, 2nd csection legs numb  . Frequency of urination   . GERD (gastroesophageal reflux disease)   . Headache    MIGRAINES   . Heart murmur    mild no cardiologist  . History of pre-eclampsia   . Hypothyroidism   . Interstitial cystitis   . LGSIL on Pap smear of cervix 2010  . Nocturia   . Pelvic pain in female   . PONV (postoperative nausea and vomiting)   . Spotting between menses   . Urgency of urination     Past Surgical History:  Procedure Laterality Date  . CESAREAN SECTION N/A 07/05/2017   Procedure: CESAREAN SECTION;  Surgeon: Brien Few, MD;  Location: [facility];  Service: Obstetrics;  Laterality: N/A;  . CESAREAN SECTION WITH BILATERAL TUBAL LIGATION N/A 11/21/2019   Procedure: CESAREAN SECTION WITH BILATERAL TUBAL LIGATION;  Surgeon: Brien Few, MD;  Location: [facility];  Service: Obstetrics;  Laterality: N/A;  . CHROMOPERTUBATION Bilateral 07/23/2016   Procedure: CHROMOPERTUBATION;  Surgeon: Brien Few, MD;  Location: [facility];  Service: Gynecology;  Laterality: Bilateral;  . COLPOSCOPY  2010  . CYSTO WITH HYDRODISTENSION  10/14/2011   Procedure: CYSTOSCOPY/HYDRODISTENSION;  Surgeon: Malka So, MD;  Location:  [facility];  Service: Urology;  Laterality: N/A;  [place] AND CURETTAGE OF UTERUS  09/16/2011   Procedure: DILATATION AND CURETTAGE;  Surgeon: Gus Height;  Location: [facility];  Service: Gynecology;  Laterality: N/A;  Endometrial Biopsy, Urethral Dilation  . DILATION AND EVACUATION N/A 02/26/2016   Procedure: DILATATION AND EVACUATION/Chromosome Analysis;  Surgeon: Brien Few, MD;  Location: [facility];  Service: Gynecology;  Laterality: N/A;  with chromosome studies   . LAPAROSCOPIC LYSIS OF ADHESIONS    . LAPAROSCOPY  09/16/2011   Procedure: LAPAROSCOPY OPERATIVE;  Surgeon: Gus Height;  Location: [facility];  Service: Gynecology;  Laterality: N/A;  . LAPAROSCOPY N/A 07/23/2016   Procedure: LAPAROSCOPY OPERATIVE, LYSIS OF ADHESIONS;  Surgeon: Brien Few, MD;  Location: [facility];  Service: Gynecology;  Laterality: N/A;  . NOSE SURGERY  2015   turbinate reduction  . SEPTOPLASTY N/A 12/24/2019   Procedure: SEPTOPLASTY;  Surgeon: [name], MD;  Location: [facility];  Service: ENT;  Laterality: N/A;  . WISDOM TOOTH EXTRACTION  2008- ORAL SURG OFFICE    Family History  Problem Relation Age of Onset  . Healthy Mother   . Healthy Father   . Hypertension Sister   . Colon cancer Paternal Grandfather   . Esophageal cancer Maternal Uncle   . Stomach cancer Neg Hx   . Liver disease Neg Hx     Social History:  reports that she quit smoking about 9 years ago. Her smoking use included cigarettes. She  has a 0.08 pack-year smoking history. She has never used smokeless tobacco. She reports that she does not drink alcohol and does not use drugs.  Allergies:  Allergies  Allergen Reactions  . Macrobid [employer] Macro] Other (See Comments)    FLU LIKE SYMPTONS  . Penicillins Hives    Has patient had a PCN reaction causing immediate rash, facial/tongue/throat swelling, SOB or lightheadedness with hypotension: yes Has patient had a PCN reaction causing  severe rash involving mucus membranes or skin necrosis: no Has patient had a PCN reaction that required hospitalization no Has patient had a PCN reaction occurring within the last 10 years: yes If all of the above answers are "NO", then may proceed with Cephalosporin use.     No medications prior to admission.    Review of Systems  Constitutional: Negative.   All other systems reviewed and are negative.   Height 5\' 5"  (1.651 m), weight 93.9 kg, last menstrual period 06/25/2020, unknown if currently breastfeeding. Physical Exam Constitutional:      Appearance: Normal appearance.  HENT:     Head: Normocephalic and atraumatic.  Cardiovascular:     Rate and Rhythm: Normal rate and regular rhythm.     Pulses: Normal pulses.     Heart sounds: Normal heart sounds.  Abdominal:     General: Bowel sounds are normal.     Palpations: Abdomen is soft.  Genitourinary:    General: Normal vulva.  Musculoskeletal:        General: Normal range of motion.     Cervical back: Normal range of motion and neck supple.  Skin:    General: Skin is warm and dry.  Neurological:     General: No focal deficit present.     Mental Status: She is alert and oriented to person, place, and time.  Psychiatric:        Mood and Affect: Mood normal.        Behavior: Behavior normal.     No results found for this or any previous visit (from the past 24 hour(s)).  No results found.  Assessment/Plan: Menorrhagia Diag HS, D&C, EAB Consent done, surgical risks discussed  [name] J 07/03/2020, 6:59 AM

## 2020-07-03 NOTE — Discharge Instructions (Signed)
DISCHARGE INSTRUCTIONS: HYSTEROSCOPY / ENDOMETRIAL ABLATION The following instructions have been prepared to help you care for yourself upon your return home.  May Remove Scop patch on or before 10/24  May take Ibuprofen after 5 pm today  May take stool softner while taking narcotic pain medication to prevent constipation.  Drink plenty of water.  Personal hygiene: Marland Kitchen Use sanitary pads for vaginal drainage, not tampons. . Shower the day after your procedure. . NO tub baths, pools or Jacuzzis for 2-3 weeks. . Wipe front to back after using the bathroom.  Activity and limitations: . Do NOT drive or operate any equipment for 24 hours. The effects of anesthesia are still present and drowsiness may result. . Do NOT rest in bed all day. . Walking is encouraged. . Walk up and down stairs slowly. . You may resume your normal activity in one to two days or as indicated by your physician. Sexual activity: NO intercourse for at least 2 weeks after the procedure, or as indicated by your Doctor.  Diet: Eat a light meal as desired this evening. You may resume your usual diet tomorrow.  Return to Work: You may resume your work activities in one to two days or as indicated by Marine scientist.  What to expect after your surgery: Expect to have vaginal bleeding/discharge for 2-3 days and spotting for up to 10 days. It is not unusual to have soreness for up to 1-2 weeks. You may have a slight burning sensation when you urinate for the first day. Mild cramps may continue for a couple of days. You may have a regular period in 2-6 weeks.  Call your doctor for any of the following: . Excessive vaginal bleeding or clotting, saturating and changing one pad every hour. . Inability to urinate 6 hours after discharge from hospital. . Pain not relieved by pain medication. . Fever of 100.4 F or greater. . Unusual vaginal discharge or odor.   Post Anesthesia Home Care Instructions  Activity: Get plenty of  rest for the remainder of the day. A responsible adult should stay with you for 24 hours following the procedure.  For the next 24 hours, DO NOT: -Drive a car -Paediatric nurse -Drink alcoholic beverages -Take any medication unless instructed by your physician -Make any legal decisions or sign important papers.  Meals: Start with liquid foods such as gelatin or soup. Progress to regular foods as tolerated. Avoid greasy, spicy, heavy foods. If nausea and/or vomiting occur, drink only clear liquids until the nausea and/or vomiting subsides. Call your physician if vomiting continues.  Special Instructions/Symptoms: Your throat may feel dry or sore from the anesthesia or the breathing tube placed in your throat during surgery. If this causes discomfort, gargle with warm salt water. The discomfort should disappear within 24 hours.  If you had a scopolamine patch placed behind your ear for the management of post- operative nausea and/or vomiting:  1. The medication in the patch is effective for 72 hours, after which it should be removed.  Wrap patch in a tissue and discard in the trash. Wash hands thoroughly with soap and water. 2. You may remove the patch earlier than 72 hours if you experience unpleasant side effects which may include dry mouth, dizziness or visual disturbances. 3. Avoid touching the patch. Wash your hands with soap and water after contact with the patch.

## 2020-07-03 NOTE — Anesthesia Procedure Notes (Signed)
Procedure Name: LMA Insertion Date/Time: 07/03/2020 10:39 AM Performed by: Suan Halter, CRNA Pre-anesthesia Checklist: Patient identified, Emergency Drugs available, Suction available and Patient being monitored Patient Re-evaluated:Patient Re-evaluated prior to induction Oxygen Delivery Method: Circle system utilized Preoxygenation: Pre-oxygenation with 100% oxygen Induction Type: IV induction Ventilation: Mask ventilation without difficulty LMA: LMA inserted LMA Size: 4.0 Number of attempts: 1 Airway Equipment and Method: Bite block Placement Confirmation: positive ETCO2 Tube secured with: Tape Dental Injury: Teeth and Oropharynx as per pre-operative assessment

## 2020-07-03 NOTE — Anesthesia Preprocedure Evaluation (Signed)
Anesthesia Evaluation  Patient identified by MRN, date of birth, ID band Patient awake    Reviewed: Allergy & Precautions, NPO status , Patient's Chart, lab work & pertinent test results  History of Anesthesia Complications (+) PONV  Airway Mallampati: II  TM Distance: >3 FB Neck ROM: Full    Dental no notable dental hx.    Pulmonary neg pulmonary ROS, former smoker,    Pulmonary exam normal breath sounds clear to auscultation       Cardiovascular negative cardio ROS Normal cardiovascular exam Rhythm:Regular Rate:Normal     Neuro/Psych Anxiety negative neurological ROS     GI/Hepatic Neg liver ROS, GERD  ,  Endo/Other  Hypothyroidism   Renal/GU negative Renal ROS  negative genitourinary   Musculoskeletal negative musculoskeletal ROS (+)   Abdominal   Peds negative pediatric ROS (+)  Hematology negative hematology ROS (+)   Anesthesia Other Findings   Reproductive/Obstetrics negative OB ROS                             Anesthesia Physical Anesthesia Plan  ASA: II  Anesthesia Plan: General   Post-op Pain Management:    Induction: Intravenous  PONV Risk Score and Plan: 4 or greater and Ondansetron, Dexamethasone, Midazolam, Treatment may vary due to age or medical condition and Droperidol  Airway Management Planned: LMA  Additional Equipment:   Intra-op Plan:   Post-operative Plan: Extubation in OR  Informed Consent: I have reviewed the patients History and Physical, chart, labs and discussed the procedure including the risks, benefits and alternatives for the proposed anesthesia with the patient or authorized representative who has indicated his/her understanding and acceptance.     Dental advisory given  Plan Discussed with: CRNA and Surgeon  Anesthesia Plan Comments:         Anesthesia Quick Evaluation

## 2020-07-03 NOTE — Op Note (Signed)
07/03/2020  11:02 AM  PATIENT:  [place]  34 y.o. female  PRE-OPERATIVE DIAGNOSIS:  Menorrhagia  POST-OPERATIVE DIAGNOSIS:  Menorrhagia  PROCEDURE:  Procedure(s): DILATATION & CURETTAGE/ HYSTEROSCOPY WITH NOVASURE ABLATION ENDOMETRIAL POLYPECTOMY  SURGEON:  Surgeon(s): Brien Few, MD  ASSISTANTS: none   ANESTHESIA:   local and general  ESTIMATED BLOOD LOSS: 5 mL   DRAINS: none   LOCAL MEDICATIONS USED:  MARCAINE    and Amount: 20 ml  SPECIMEN:  Source of Specimen:  EMC AND POLYPS  DISPOSITION OF SPECIMEN:  PATHOLOGY  COUNTS:  YES  DICTATION #: [id number]  PLAN OF CARE: DC HOME  PATIENT DISPOSITION:  PACU - hemodynamically stable.

## 2020-07-03 NOTE — Op Note (Signed)
[name], [name] MEDICAL RECORD [id number] ACCOUNT [id number] DATE OF BIRTH:[birth year] FACILITY: [facility] LOCATION: [facility] PHYSICIAN:Alonnah Lampkins J. Kanijah Groseclose, MD  OPERATIVE REPORT  DATE OF PROCEDURE:  07/03/2020  PREOPERATIVE DIAGNOSIS:  Menometrorrhagia, refractory to hormonal therapy, unable to take hormonal therapy due to severe migraine status.  POSTOPERATIVE DIAGNOSES:  Menometrorrhagia, refractory to hormonal therapy, unable to take hormonal therapy due to severe migraine status, plus endometrial polyp.  PROCEDURE:  Diagnostic hysteroscopy, dilatation and curettage, endometrial polypectomy, NovaSure endometrial ablation.  SURGEON:  Brien Few, MD  ASSISTANT:  None  ANESTHESIA:  General.  ESTIMATED BLOOD LOSS:  5 mL  COMPLICATIONS:  None.  DRAINS:  None.  COUNTS:  Correct.  FLUID DEFICIT:  80 mL.  DISPOSITION:  Patient was taken to recovery in good condition.  SPECIMENS:  Endometrial curettings, polyp, and polyp #2 to pathology.  BRIEF OPERATIVE NOTE:  After being apprised of the risks of anesthesia, infection, bleeding, injury to surrounding organs, possible need for repair, delayed versus immediate complications due to bowel and bladder injury, possible need for repair, patient  was brought to the operating room where she was administered general anesthetic without complications, placed in dorsal lithotomy position.  Feet are placed in [place].  Exam under anesthesia reveals a bulky retroflexed uterus and no adnexal  masses.  Dilute Pitressin solution placed at 3 and 9 o'clock, 18 mL total dilute, Marcaine solution placed.  Standard paracervical block, 20 mL total.  Cervix easily dilated up to 21 Pratt dilator.  Hysteroscope placed.  Visualization reveals 2  endometrial wall polyps which were removed without difficulty.  Repeat hysteroscopy confirmed removal.  Blunt curettage was performed in a 4-quadrant method.  Otherwise, normal cavity noted.  At this  time, NovaSure device is entered and seated to a  length of 6 and a width of 3.6, and initiated after negative CO2 test for a successful ablation.  Device was removed and inspected and found to be intact.  Revisualization endometrial cavity reveals a successful global ablation with no evidence of  endometrial perforation or uterine perforation.  The patient tolerated the procedure well, was awakened and transferred to recovery in good condition.  CN/NUANCE  D:07/03/2020 T:07/03/2020 JOB:[id number]

## 2020-07-04 ENCOUNTER — Encounter (HOSPITAL_BASED_OUTPATIENT_CLINIC_OR_DEPARTMENT_OTHER): Payer: Self-pay | Admitting: Obstetrics and Gynecology

## 2020-07-04 LAB — SURGICAL PATHOLOGY

## 2020-07-08 MED FILL — LEVOTHYROXINE SODIUM 25 MCG: 25 | 90 days supply | Qty: 90 | Fill #3

## 2020-07-15 DIAGNOSIS — L578 Other skin changes due to chronic exposure to nonionizing radiation: Secondary | ICD-10-CM | POA: Diagnosis not present

## 2020-07-15 DIAGNOSIS — L720 Epidermal cyst: Secondary | ICD-10-CM | POA: Diagnosis not present

## 2020-07-15 DIAGNOSIS — Z86018 Personal history of other benign neoplasm: Secondary | ICD-10-CM | POA: Diagnosis not present

## 2020-07-15 DIAGNOSIS — D239 Other benign neoplasm of skin, unspecified: Secondary | ICD-10-CM | POA: Diagnosis not present

## 2020-07-31 ENCOUNTER — Other Ambulatory Visit (HOSPITAL_COMMUNITY): Payer: Self-pay | Admitting: Obstetrics and Gynecology

## 2020-07-31 DIAGNOSIS — N6489 Other specified disorders of breast: Secondary | ICD-10-CM | POA: Diagnosis not present

## 2020-07-31 DIAGNOSIS — N920 Excessive and frequent menstruation with regular cycle: Secondary | ICD-10-CM | POA: Diagnosis not present

## 2020-07-31 MED FILL — PROPRANOLOL 20 MG TABLET: 20 | 30 days supply | Qty: 60 | Fill #2

## 2020-07-31 MED FILL — CITALOPRAM HBR 10 MG TABLET: 10 | 30 days supply | Qty: 30 | Fill #5

## 2020-07-31 MED FILL — PHENTERMINE 37.5 MG TABLET: 37.5 | 30 days supply | Qty: 30 | Fill #3

## 2020-07-31 MED FILL — CYCLOBENZAPRINE HCL 5 MG TA: 5 | 30 days supply | Qty: 30 | Fill #2

## 2020-08-05 ENCOUNTER — Encounter: Payer: Self-pay | Admitting: Internal Medicine

## 2020-08-05 ENCOUNTER — Other Ambulatory Visit (HOSPITAL_COMMUNITY): Payer: Self-pay | Admitting: Internal Medicine

## 2020-08-05 ENCOUNTER — Ambulatory Visit (INDEPENDENT_AMBULATORY_CARE_PROVIDER_SITE_OTHER): Payer: 59 | Admitting: Internal Medicine

## 2020-08-05 VITALS — BP 104/74 | HR 95 | Ht 65.0 in | Wt 206.1 lb

## 2020-08-05 DIAGNOSIS — K582 Mixed irritable bowel syndrome: Secondary | ICD-10-CM | POA: Insufficient documentation

## 2020-08-05 DIAGNOSIS — L309 Dermatitis, unspecified: Secondary | ICD-10-CM

## 2020-08-05 DIAGNOSIS — K601 Chronic anal fissure: Secondary | ICD-10-CM

## 2020-08-05 DIAGNOSIS — K644 Residual hemorrhoidal skin tags: Secondary | ICD-10-CM | POA: Diagnosis not present

## 2020-08-05 MED ORDER — BENEFIBER PO POWD: ORAL | 0 refills | Status: DC

## 2020-08-05 MED ORDER — NYSTATIN-TRIAMCINOLONE 100000-0.1 UNIT/GM-% EX OINT: 1.0000 "application " | TOPICAL_OINTMENT | Freq: Two times a day (BID) | CUTANEOUS | 0 refills | Status: DC

## 2020-08-05 MED FILL — NYSTATIN-TRIAMCINOLONE 1000: 100000-0.1 | 15 days supply | Qty: 30 | Fill #0

## 2020-08-05 NOTE — Patient Instructions (Addendum)
If you are age 32 or older, your body mass index should be between 23-30. Your Body mass index is 34.3 kg/m. If this is out of the aforementioned range listed, please consider follow up with your Primary Care Provider.  If you are age 22 or younger, your body mass index should be between 19-25. Your Body mass index is 34.3 kg/m. If this is out of the aformentioned range listed, please consider follow up with your Primary Care Provider.   Please purchase the following medications over the counter and take as directed:  START: benefiber 1 tablespoon in 6 to 8 ounces of liquid daily.  START: peri anal dermatology ointment (mycolog) apply twice daily for 2 weeks.  START: generic recti-care: use before wiping around anal tag area.  You will follow up with our office on an as needed basis.  Thank you for choosing [facility] for your health care needs!  Dr Silvano Rusk, MD

## 2020-08-05 NOTE — Progress Notes (Signed)
[name] 34 y.o. [birth year] [id number]  Assessment & Plan:   Encounter Diagnoses  Name Primary?  . Chronic anal fissure Yes  . Anal skin tag   . Irritable bowel syndrome with both constipation and diarrhea   . Perianal dermatitis     She is clearly improved.  However these anal and rectal symptoms are bothersome.  My plan is as follows:  Continue diltiazem and lidocaine cream for fissure.  Usual recommendation is to take it for 1 month after everything feels back to normal.  Add Benefiber 1 to 2 tablespoons daily consider discontinuing stool softeners if that is helping as probably does not need both.  Treat this minor perianal dermatitis with Mycolog for 2 weeks to see if that provides some benefit to this irritated skin tag complaint  Consider trying topical 5% lidocaine applied to the perianal area prior to wiping to see if that will appropriately anesthetize the area to improve quality of life.  She will see me back as needed.  I explained how for persistent fissure sometimes Botox injection in the most aggressive approach would be surgery are considered.  Hopefully she will avoid that it seems like she will.  Ultimately if the skin tag continues to bother her we could seek another opinion regarding colorectal surgery though overall I think cautious nonoperative approach is best.  I appreciate the opportunity to care for this patient. CC: Ashley Huddle, MD Dr. Brien [name]  Subjective:   Chief Complaint: Follow-up of anal fissure and anal skin tag  HPI [name] is a 34 year old white woman with a history of interstitial cystitis and a diagnosis of anal fissure on June 17, 2020 visit with me, as well as a painful anal skin tag.  I started treatment with diltiazem lidocaine and that has made a big difference with a lot less pain and bleeding symptoms from her anal fissure.  She still has pain in the skin tag area when she wipes it is very sensitive.  Though she  reported that she was having good bowel movements when she was last here she has been taking stool softeners and is afraid to stop these.  She is using wet wipes and using a hair dryer to dry the area to avoid excessive wiping and trauma.  She complains of some incomplete defecation as well.  She has not been on any fiber supplements.  She did have a history of alternating bowel habits that aggravated her more [name] after delivery but that had straightened out.  Perhaps related to using the stool softeners.  Her interstitial cystitis is not bothering her.  She did try pelvic floor physical therapy for that but did not find it helpful.  She did not have bowel issues when she was doing that.  Recall that she had seen colorectal surgery, Dr. Marcello [name], who recommended against surgery for her anal tag. Allergies  Allergen Reactions  . Macrobid [employer] Macro] Other (See Comments)    FLU LIKE SYMPTONS  . Penicillins Hives    Has patient had a PCN reaction causing immediate rash, facial/tongue/throat swelling, SOB or lightheadedness with hypotension: yes Has patient had a PCN reaction causing severe rash involving mucus membranes or skin necrosis: no Has patient had a PCN reaction that required hospitalization no Has patient had a PCN reaction occurring within the last 10 years: yes If all of the above answers are "NO", then may proceed with Cephalosporin use.    Current Meds  Medication Sig  .  ALPRAZolam (XANAX) 0.5 MG tablet Take 0.5 mg by mouth daily as needed.  . AMBULATORY NON FORMULARY MEDICATION Diltiazem 2% gel mixed with Lidocaine 5% Sig : apply a pea size amount to rectum three times daily  . butalbital-acetaminophen-caffeine (FIORICET) 50-325-40 MG tablet Take 1-2 tablets by mouth every 4 (four) hours as needed.  . citalopram (CELEXA) 20 MG tablet Take 20 mg by mouth at bedtime.   . cyclobenzaprine (FLEXERIL) 5 MG tablet Take 1 tablet (5 mg total) by mouth at bedtime.  .  diphenhydrAMINE (BENADRYL) 25 MG tablet Take 25 mg by mouth at bedtime as needed for allergies or sleep.  Marland Kitchen docusate sodium (COLACE) 100 MG capsule Take 200 mg by mouth daily.  . frovatriptan (FROVA) 2.5 MG tablet Take 2.5 mg by mouth as needed.  Marland Kitchen ibuprofen (ADVIL) 600 MG tablet Take 600 mg by mouth 3 (three) times daily as needed.  Marland Kitchen levothyroxine (SYNTHROID) 25 MCG tablet Take 25 mcg by mouth daily before breakfast.   . propranolol (INDERAL) 20 MG tablet Take 1 tablet (20 mg total) by mouth 2 (two) times daily.  . rizatriptan (MAXALT) 10 MG tablet Take 10 mg by mouth as needed for migraine. May repeat in 2 hours if needed   Past Medical History:  Diagnosis Date  . Anxiety   . Asthma    CHILDHOOD  . Bacterial vaginitis   . Chronic anal fissure   . Chronic kidney disease    INTERTERSIAL CYSTITIS  . Complication of anesthesia    has concerns from anesthesia from  first  CS, breathing problems, 2nd csection legs numb  . Frequency of urination   . GERD (gastroesophageal reflux disease)   . Headache    MIGRAINES   . Heart murmur    mild no cardiologist  . History of pre-eclampsia   . Hypothyroidism   . Interstitial cystitis   . LGSIL on Pap smear of cervix 2010  . Nocturia   . Pelvic pain in female   . PONV (postoperative nausea and vomiting)   . Spotting between menses   . Urgency of urination    Past Surgical History:  Procedure Laterality Date  . CESAREAN SECTION N/A 07/05/2017   Procedure: CESAREAN SECTION;  Surgeon: Ashley Few, MD;  Location: [facility];  Service: Obstetrics;  Laterality: N/A;  . CESAREAN SECTION WITH BILATERAL TUBAL LIGATION N/A 11/21/2019   Procedure: CESAREAN SECTION WITH BILATERAL TUBAL LIGATION;  Surgeon: Ashley Few, MD;  Location: [facility];  Service: Obstetrics;  Laterality: N/A;  . CHROMOPERTUBATION Bilateral 07/23/2016   Procedure: CHROMOPERTUBATION;  Surgeon: Ashley Few, MD;  Location: [facility];  Service: Gynecology;   Laterality: Bilateral;  . COLPOSCOPY  2010  . CYSTO WITH HYDRODISTENSION  10/14/2011   Procedure: CYSTOSCOPY/HYDRODISTENSION;  Surgeon: Ashley So, MD;  Location: [facility];  Service: Urology;  Laterality: N/A;  [place] AND CURETTAGE OF UTERUS  09/16/2011   Procedure: DILATATION AND CURETTAGE;  Surgeon: Gus Height;  Location: [facility];  Service: Gynecology;  Laterality: N/A;  Endometrial Biopsy, Urethral Dilation  . DILATION AND EVACUATION N/A 02/26/2016   Procedure: DILATATION AND EVACUATION/Chromosome Analysis;  Surgeon: Ashley Few, MD;  Location: [facility];  Service: Gynecology;  Laterality: N/A;  with chromosome studies   . DILITATION & CURRETTAGE/HYSTROSCOPY WITH NOVASURE ABLATION N/A 07/03/2020   Procedure: DILATATION & CURETTAGE/HYSTEROSCOPY WITH NOVASURE ABLATION;  Surgeon: Ashley Few, MD;  Location: [facility];  Service: Gynecology;  Laterality:  N/A;  . LAPAROSCOPIC LYSIS OF ADHESIONS    . LAPAROSCOPY  09/16/2011   Procedure: LAPAROSCOPY OPERATIVE;  Surgeon: Gus Height;  Location: [facility];  Service: Gynecology;  Laterality: N/A;  . LAPAROSCOPY N/A 07/23/2016   Procedure: LAPAROSCOPY OPERATIVE, LYSIS OF ADHESIONS;  Surgeon: Ashley Few, MD;  Location: [facility];  Service: Gynecology;  Laterality: N/A;  . NOSE SURGERY  2015   turbinate reduction  . SEPTOPLASTY N/A 12/24/2019   Procedure: SEPTOPLASTY;  Surgeon: [name], MD;  Location: [facility];  Service: ENT;  Laterality: N/A;  . 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   Review of  Systems As per HPI  Objective:   Physical Exam BP 104/74 (BP Location: Left Arm, Patient Position: Sitting, Cuff Size: Normal)   Pulse 95   Ht 5\' 5"  (1.651 m)   Wt 206 lb 2 oz (93.5 kg)   SpO2 99%   BMI 34.30 kg/m   Carlynn Purl, CMA present  Rectal exam reveals a small fleshy tag in the anterior position there is a mild perianal dermatitis surrounding the anus and in the gluteal crease  Digital exam is nontender without mass

## 2020-08-11 ENCOUNTER — Other Ambulatory Visit (HOSPITAL_COMMUNITY): Payer: Self-pay | Admitting: Internal Medicine

## 2020-08-11 MED FILL — WEGOVY 0.25 MG/0.5ML SOAJ: 0.25 | 28 days supply | Qty: 2 | Fill #0

## 2020-08-12 MED FILL — IBUPROFEN 600 MG TABLET: 600 | 30 days supply | Qty: 90 | Fill #0

## 2020-08-12 MED FILL — FROVATRIPTAN SUCCINATE 2.5: 2.5 | 30 days supply | Qty: 15 | Fill #3

## 2020-08-19 ENCOUNTER — Other Ambulatory Visit (HOSPITAL_COMMUNITY): Payer: Self-pay | Admitting: Adult Health

## 2020-08-19 ENCOUNTER — Other Ambulatory Visit: Payer: Self-pay

## 2020-08-19 ENCOUNTER — Encounter: Payer: Self-pay | Admitting: Adult Health

## 2020-08-19 ENCOUNTER — Ambulatory Visit (INDEPENDENT_AMBULATORY_CARE_PROVIDER_SITE_OTHER): Payer: 59 | Admitting: Adult Health

## 2020-08-19 VITALS — BP 119/80 | HR 89 | Ht 65.0 in | Wt 205.0 lb

## 2020-08-19 DIAGNOSIS — G43709 Chronic migraine without aura, not intractable, without status migrainosus: Secondary | ICD-10-CM | POA: Diagnosis not present

## 2020-08-19 MED ORDER — PROPRANOLOL HCL 10 MG PO TABS: 10.0000 mg | ORAL_TABLET | Freq: Two times a day (BID) | ORAL | 11 refills | Status: DC

## 2020-08-19 MED FILL — PROPRANOLOL 10 MG TABLET: 10 | 30 days supply | Qty: 60 | Fill #0

## 2020-08-19 NOTE — Patient Instructions (Signed)
Your Plan:  Propranolol 10 mg twice a day Continue Frova if needed Continue Flexeril if needed ok to to wean of if needed If your symptoms worsen or you develop new symptoms please let us know.   Thank you for coming to see [place] at [facility]. I hope we have been able to provide you high quality care today.  You may receive a patient satisfaction survey over the next few weeks. We would appreciate your feedback and comments so that we may continue to improve ourselves and the health of our patients.

## 2020-08-19 NOTE — Progress Notes (Signed)
PATIENT: [name] DOB: 07/22/1986  REASON FOR VISIT: follow up HISTORY FROM: patient  HISTORY OF PRESENT ILLNESS: Today 08/19/20:  [name] is a 34 year old female with a history of migraine headaches.  She returns today for follow-up.  She is currently taking propranolol 20 mg daily.  She states that she try taking 20 mg twice a day but the morning dose made her feel very tired.  She states that during the day she sometimes feels like her heart is racing.  This is not always due to stressful situations.  She continues to take Flexeril at bedtime.  She thinks that it has been helpful for tightness in the neck.  She reports that she has approximately 4 headaches a month.  Since the last visit she is only had to use frovatriptan 3 times.  She does feel like the medication has been helpful.  Ideally she would like to build to wean off of some of the medicine in the future.    REVIEW OF SYSTEMS: Out of a complete 14 system review of symptoms, the patient complains only of the following symptoms, and all other reviewed systems are negative.  See HPI  ALLERGIES: Allergies  Allergen Reactions  . Macrobid [employer] Macro] Other (See Comments)    FLU LIKE SYMPTONS  . Penicillins Hives    Has patient had a PCN reaction causing immediate rash, facial/tongue/throat swelling, SOB or lightheadedness with hypotension: yes Has patient had a PCN reaction causing severe rash involving mucus membranes or skin necrosis: no Has patient had a PCN reaction that required hospitalization no Has patient had a PCN reaction occurring within the last 10 years: yes If all of the above answers are "NO", then may proceed with Cephalosporin use.     HOME MEDICATIONS: Outpatient Medications Prior to Visit  Medication Sig Dispense Refill  . Semaglutide-Weight Management (WEGOVY [place]) Inject into the skin.    Marland Kitchen ALPRAZolam (XANAX) 0.5 MG tablet Take 0.5 mg by mouth daily as needed.    .  AMBULATORY NON FORMULARY MEDICATION Diltiazem 2% gel mixed with Lidocaine 5% Sig : apply a pea size amount to rectum three times daily 30 g 2  . butalbital-acetaminophen-caffeine (FIORICET) 50-325-40 MG tablet Take 1-2 tablets by mouth every 4 (four) hours as needed.    . citalopram (CELEXA) 20 MG tablet Take 20 mg by mouth at bedtime.     . cyclobenzaprine (FLEXERIL) 5 MG tablet Take 1 tablet (5 mg total) by mouth at bedtime. 30 tablet 5  . diphenhydrAMINE (BENADRYL) 25 MG tablet Take 25 mg by mouth at bedtime as needed for allergies or sleep.    Marland Kitchen docusate sodium (COLACE) 100 MG capsule Take 200 mg by mouth daily.    . frovatriptan (FROVA) 2.5 MG tablet Take 2.5 mg by mouth as needed.    Marland Kitchen ibuprofen (ADVIL) 600 MG tablet Take 600 mg by mouth 3 (three) times daily as needed.    Marland Kitchen levothyroxine (SYNTHROID) 25 MCG tablet Take 25 mcg by mouth daily before breakfast.     . nystatin-triamcinolone ointment (MYCOLOG) Apply 1 application topically 2 (two) times daily. 30 g 0  . propranolol (INDERAL) 20 MG tablet Take 1 tablet (20 mg total) by mouth 2 (two) times daily. 60 tablet 6  . rizatriptan (MAXALT) 10 MG tablet Take 10 mg by mouth as needed for migraine. May repeat in 2 hours if needed    . Wheat Dextrin (BENEFIBER) POWD 1-2 tablespoons nightly-twice a day  0   No facility-administered medications prior to visit.    PAST MEDICAL HISTORY: Past Medical History:  Diagnosis Date  . Anxiety   . Asthma    CHILDHOOD  . Bacterial vaginitis   . Chronic anal fissure   . Chronic kidney disease    INTERTERSIAL CYSTITIS  . Complication of anesthesia    has concerns from anesthesia from  first  CS, breathing problems, 2nd csection legs numb  . Contact dermatitis due to adhesives 07/06/2017  . Frequency of urination   . GERD (gastroesophageal reflux disease)   . Headache    MIGRAINES   . Heart murmur    mild no cardiologist  . History of pre-eclampsia   . Hypothyroidism   . Interstitial  cystitis   . LGSIL on Pap smear of cervix 2010  . Nocturia   . Pelvic pain in female   . PONV (postoperative nausea and vomiting)   . Spotting between menses   . Urgency of urination     PAST SURGICAL HISTORY: Past Surgical History:  Procedure Laterality Date  . CESAREAN SECTION N/A 07/05/2017   Procedure: CESAREAN SECTION;  Surgeon: Brien Few, MD;  Location: [facility];  Service: Obstetrics;  Laterality: N/A;  . CESAREAN SECTION WITH BILATERAL TUBAL LIGATION N/A 11/21/2019   Procedure: CESAREAN SECTION WITH BILATERAL TUBAL LIGATION;  Surgeon: Brien Few, MD;  Location: [facility];  Service: Obstetrics;  Laterality: N/A;  . CHROMOPERTUBATION Bilateral 07/23/2016   Procedure: CHROMOPERTUBATION;  Surgeon: Brien Few, MD;  Location: [facility];  Service: Gynecology;  Laterality: Bilateral;  . COLPOSCOPY  2010  . CYSTO WITH HYDRODISTENSION  10/14/2011   Procedure: CYSTOSCOPY/HYDRODISTENSION;  Surgeon: Malka So, MD;  Location: [facility];  Service: Urology;  Laterality: N/A;  [place] AND CURETTAGE OF UTERUS  09/16/2011   Procedure: DILATATION AND CURETTAGE;  Surgeon: Gus Height;  Location: [facility];  Service: Gynecology;  Laterality: N/A;  Endometrial Biopsy, Urethral Dilation  . DILATION AND EVACUATION N/A 02/26/2016   Procedure: DILATATION AND EVACUATION/Chromosome Analysis;  Surgeon: Brien Few, MD;  Location: [facility];  Service: Gynecology;  Laterality: N/A;  with chromosome studies   . DILITATION & CURRETTAGE/HYSTROSCOPY WITH NOVASURE ABLATION N/A 07/03/2020   Procedure: DILATATION & CURETTAGE/HYSTEROSCOPY WITH NOVASURE ABLATION;  Surgeon: Brien Few, MD;  Location: [facility];  Service: Gynecology;  Laterality: N/A;  . LAPAROSCOPIC LYSIS OF ADHESIONS    . LAPAROSCOPY  09/16/2011   Procedure: LAPAROSCOPY OPERATIVE;  Surgeon: Gus Height;  Location: [facility];  Service: Gynecology;  Laterality: N/A;  . LAPAROSCOPY N/A 07/23/2016    Procedure: LAPAROSCOPY OPERATIVE, LYSIS OF ADHESIONS;  Surgeon: Brien Few, MD;  Location: [facility];  Service: Gynecology;  Laterality: N/A;  . NOSE SURGERY  2015   turbinate reduction  . SEPTOPLASTY N/A 12/24/2019   Procedure: SEPTOPLASTY;  Surgeon: [name], MD;  Location: [facility];  Service: ENT;  Laterality: N/A;  . WISDOM TOOTH EXTRACTION  2008- ORAL SURG OFFICE    FAMILY HISTORY: Family History  Problem Relation Age of Onset  . Healthy Mother   . Healthy Father   . Hypertension Sister   . Colon cancer Paternal Grandfather   . Esophageal cancer Maternal Uncle   . Stomach cancer Neg Hx   . Liver disease Neg Hx     SOCIAL HISTORY: Social History   Socioeconomic History  . Marital status: Married    Spouse name: Not on file  .  Number of children: Not on file  . Years of education: Not on file  . Highest education level: Not on file  Occupational History  . Occupation: MRI technologist    Employer: [employer]  Tobacco Use  . Smoking status: Former Smoker    Packs/day: 0.15    Years: 0.50    Pack years: 0.07    Types: Cigarettes    Quit date: 02/25/2011    Years since quitting: 9.4  . Smokeless tobacco: Never Used  Vaping Use  . Vaping Use: Never used  Substance and Sexual Activity  . Alcohol use: No  . Drug use: No  . Sexual activity: Yes    Birth control/protection: None, Abstinence  Other Topics Concern  . 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 Difficulty of Paying Living Expenses: Not on file  Food Insecurity:   . Worried About Charity fundraiser in the Last Year: Not on file  . Ran Out of Food in the Last Year: Not on file  Transportation Needs:   . Lack of Transportation (Medical): Not on file    . Lack of Transportation (Non-Medical): Not on file  Physical Activity:   . Days of Exercise per Week: Not on file  . Minutes of Exercise per Session: Not on file  Stress:   . Feeling of Stress : Not on file  Social Connections:   . Frequency of Communication with Friends and Family: Not on file  . Frequency of Social Gatherings with Friends and Family: Not on file  . Attends Religious Services: Not on file  . Active Member of Clubs or Organizations: Not on file  . Attends Archivist Meetings: Not on file  . Marital Status: Not on file  Intimate Partner Violence:   . Fear of Current or Ex-Partner: Not on file  . Emotionally Abused: Not on file  . Physically Abused: Not on file  . Sexually Abused: Not on file      PHYSICAL EXAM  Vitals:   08/19/20 1332  BP: 119/80  Pulse: 89  Weight: 205 lb (93 kg)  Height: 5\' 5"  (1.651 m)   Body mass index is 34.11 kg/m.  Generalized: Well developed, in no acute distress   Neurological examination  Mentation: Alert oriented to time, place, history taking. Follows all commands speech and language fluent Cranial nerve II-XII: Pupils were equal round reactive to light. Extraocular movements were full, visual field were full on confrontational test. Head turning and shoulder shrug  were normal and symmetric. Motor: The motor testing reveals 5 over 5 strength of all 4 extremities. Good symmetric motor tone is noted throughout.  Sensory: Sensory testing is intact to soft touch on all 4 extremities. No evidence of extinction is noted.  Coordination: Cerebellar testing reveals good finger-nose-finger and heel-to-shin bilaterally.  Gait and station: Gait is normal.  Reflexes: Deep tendon reflexes are symmetric and normal bilaterally.   DIAGNOSTIC DATA (LABS, IMAGING, TESTING) - I reviewed patient records, labs, notes, testing and imaging myself where available.  Lab Results  Component Value Date   WBC 8.2 07/03/2020   HGB 14.2  07/03/2020   HCT 42.6 07/03/2020   MCV 90.8 07/03/2020  PLT 410 (H) 07/03/2020      Component Value Date/Time   NA 135 07/04/2017 1743   K 4.0 07/04/2017 1743   CL 107 07/04/2017 1743   CO2 19 (L) 07/04/2017 1743   GLUCOSE 121 (H) 07/04/2017 1743   BUN 10 07/04/2017 1743   CREATININE 0.61 07/04/2017 1743   CALCIUM 8.7 (L) 07/04/2017 1743   PROT 5.8 (L) 07/04/2017 1743   ALBUMIN 2.9 (L) 07/04/2017 1743   AST 23 07/04/2017 1743   ALT 13 (L) 07/04/2017 1743   ALKPHOS 136 (H) 07/04/2017 1743   BILITOT 0.3 07/04/2017 1743   GFRNONAA >60 07/04/2017 1743   GFRAA >60 07/04/2017 1743      ASSESSMENT AND PLAN 34 y.o. year old female  has a past medical history of Anxiety, Asthma, Bacterial vaginitis, Chronic anal fissure, Chronic kidney disease, Complication of anesthesia, Contact dermatitis due to adhesives (07/06/2017), Frequency of urination, GERD (gastroesophageal reflux disease), Headache, Heart murmur, History of pre-eclampsia, Hypothyroidism, Interstitial cystitis, LGSIL on Pap smear of cervix (2010), Nocturia, Pelvic pain in female, PONV (postoperative nausea and vomiting), Spotting between menses, and Urgency of urination. here with:  1.  Migraine headaches  -We will try propranolol 10 mg twice a day to see if she tolerates this better.  If she continues to feel that her heart is racing she was advised to follow-up with her PCP -Continue Flexeril at bedtime.  Advised that if she wanted to try to decrease her dose to half a tablet she could. -Okay to use frovatriptan if needed -Follow-up in 6 months or sooner if needed   I spent 25 minutes of face-to-face and non-face-to-face time with patient.  This included previsit chart review, lab review, study review, order entry, electronic health record documentation, patient education.  Ward Givens, MSN, NP-C 08/19/2020, 2:03 PM [facility] [address] [place], [place] [address] [phone]

## 2020-08-20 ENCOUNTER — Other Ambulatory Visit (HOSPITAL_COMMUNITY): Payer: Self-pay | Admitting: Obstetrics and Gynecology

## 2020-08-20 MED FILL — FLUCONAZOLE 150 MG TABS: 150 | 7 days supply | Qty: 3 | Fill #0

## 2020-08-26 MED FILL — CITALOPRAM HBR 10 MG TABLET: 10 | 30 days supply | Qty: 30 | Fill #6

## 2020-08-26 MED FILL — CYCLOBENZAPRINE HCL 5 MG TA: 5 | 30 days supply | Qty: 30 | Fill #3

## 2020-09-03 MED FILL — WEGOVY 0.5 MG/0.5ML SOAJ: 0.5 | 28 days supply | Qty: 2 | Fill #0

## 2020-09-30 ENCOUNTER — Telehealth: Payer: 59 | Admitting: Nurse Practitioner

## 2020-09-30 ENCOUNTER — Other Ambulatory Visit (HOSPITAL_COMMUNITY): Payer: Self-pay | Admitting: Nurse Practitioner

## 2020-09-30 DIAGNOSIS — R059 Cough, unspecified: Secondary | ICD-10-CM

## 2020-09-30 DIAGNOSIS — R52 Pain, unspecified: Secondary | ICD-10-CM

## 2020-09-30 DIAGNOSIS — Z20822 Contact with and (suspected) exposure to covid-19: Secondary | ICD-10-CM

## 2020-09-30 DIAGNOSIS — R5081 Fever presenting with conditions classified elsewhere: Secondary | ICD-10-CM

## 2020-09-30 MED ORDER — NAPROXEN 500 MG PO TABS: 500.0000 mg | ORAL_TABLET | Freq: Two times a day (BID) | ORAL | 0 refills | Status: DC

## 2020-09-30 MED ORDER — BENZONATATE 100 MG PO CAPS: 100.0000 mg | ORAL_CAPSULE | Freq: Three times a day (TID) | ORAL | 0 refills | Status: DC | PRN

## 2020-09-30 NOTE — Progress Notes (Signed)
E-Visit for Corona Virus Screening  Your current symptoms could be consistent with the coronavirus.  Many health care providers can now test patients at their office but not all are.  [facility] has multiple testing sites. For information on our COVID testing locations and hours go to [email or web address]   Testing Information: The COVID-19 Community Testing sites are testing BY APPOINTMENT ONLY.  You can schedule online at [email or web address]  If you do not have access to a smart phone or computer you may call [phone] for an appointment.   Additional testing sites in the Community:  . For [facility] Testing sites in [facility]  FaceUpdate.uy  . For Pop-up testing sites in [place]  BowlDirectory.co.uk  . For [facility] [email or web address]  . For [facility] testing in [place] and [employer] [email or web address]  . For [facility] testing in [facility]   [email or web address]  For  more information about community testing call [phone]   Please quarantine yourself while awaiting your test results. Please stay home for a minimum of 10 days from the first day of illness with improving symptoms and you have had 24 hours of no fever (without the use of Tylenol (Acetaminophen) Motrin (Ibuprofen) or any fever reducing medication).  Also - Do not get tested prior to returning to work because once you have had a positive test the test can stay positive for more than a month in some cases.   You should wear a mask or cloth face covering over your nose and mouth if you must be around other people or animals, including pets (even at home). Try to  stay at least 6 feet away from other people. This will protect the people around you.  Please continue good preventive care measures, including:  frequent hand-washing, avoid touching your face, cover coughs/sneezes, stay out of crowds and keep a 6 foot distance from others.  COVID-19 is a respiratory illness with symptoms that are similar to the flu. Symptoms are typically mild to moderate, but there have been cases of severe illness and death due to the virus.   The following symptoms may appear 2-14 days after exposure: . Fever . Cough . Shortness of breath or difficulty breathing . Chills . Repeated shaking with chills . Muscle pain . Headache . Sore throat . New loss of taste or smell . Fatigue . Congestion or runny nose . Nausea or vomiting . Diarrhea  Go to the nearest hospital ED for assessment if fever/cough/breathlessness are severe or illness seems like a threat to life.  It is vitally important that if you feel that you have an infection such as this virus or any other virus that you stay home and away from places where you may spread it to others.  You should avoid contact with people age 78 and older.   You can use medication such as A prescription cough medication called Tessalon Perles 100 mg. You may take 1-2 capsules every 8 hours as needed for cough and A prescription anti-inflammatory called Naprosyn 500 mg. Take twice daily as needed for fever or body aches for 2 weeks  You may also take acetaminophen (Tylenol) as needed for fever.  Reduce your risk of any infection by using the same precautions used for avoiding the common cold or flu:  Marland Kitchen Wash your hands often with soap and warm water for at least 20 seconds.  If soap and water are not readily available, use an alcohol-based hand sanitizer with at least 60% alcohol.  Marland Kitchen  If coughing or sneezing, cover your mouth and nose by coughing or sneezing into the elbow areas of your shirt or coat, into a tissue or into your sleeve  (not your hands). . Avoid shaking hands with others and consider head nods or verbal greetings only. . Avoid touching your eyes, nose, or mouth with unwashed hands.  . Avoid close contact with people who are sick. . Avoid places or events with large numbers of people in one location, like concerts or sporting events. . Carefully consider travel plans you have or are making. . If you are planning any travel outside or inside the [place], visit the CDC's Travelers' Health webpage for the latest health notices. . If you have some symptoms but not all symptoms, continue to monitor at home and seek medical attention if your symptoms worsen. . If you are having a medical emergency, call 911.  HOME CARE . Only take medications as instructed by your medical team. . Drink plenty of fluids and get plenty of rest. . A steam or ultrasonic humidifier can help if you have congestion.   GET HELP RIGHT AWAY IF YOU HAVE EMERGENCY WARNING SIGNS** FOR COVID-19. If you or someone is showing any of these signs seek emergency medical care immediately. Call 911 or proceed to your closest emergency facility if: . You develop worsening high fever. . Trouble breathing . Bluish lips or face . Persistent pain or pressure in the chest . New confusion . Inability to wake or stay awake . You cough up blood. . Your symptoms become more severe  **This list is not all possible symptoms. Contact your medical provider for any symptoms that are sever or concerning to you.  MAKE SURE YOU   Understand these instructions.  Will watch your condition.  Will get help right away if you are not doing well or get worse.  Your e-visit answers were reviewed by a board certified advanced clinical practitioner to complete your personal care plan.  Depending on the condition, your plan could have included both over the counter or prescription medications.  If there is a problem please reply once you have received a response from your  provider.  Your safety is important to [place].  If you have drug allergies check your prescription carefully.    You can use MyChart to ask questions about today's visit, request a non-urgent call back, or ask for a work or school excuse for 24 hours related to this e-Visit. If it has been greater than 24 hours you will need to follow up with your provider, or enter a new e-Visit to address those concerns. You will get an e-mail in the next two days asking about your experience.  I hope that your e-visit has been valuable and will speed your recovery. Thank you for using e-visits.   5-10 minutes spent reviewing and documenting in chart.

## 2020-10-02 MED FILL — WEGOVY 1 MG/0.5ML SOAJ: 1 | 28 days supply | Qty: 2 | Fill #0

## 2020-10-03 ENCOUNTER — Telehealth: Payer: 59 | Admitting: Physician Assistant

## 2020-10-03 ENCOUNTER — Encounter: Payer: Self-pay | Admitting: Physician Assistant

## 2020-10-03 ENCOUNTER — Telehealth: Payer: No Typology Code available for payment source | Admitting: Physician Assistant

## 2020-10-03 DIAGNOSIS — H9202 Otalgia, left ear: Secondary | ICD-10-CM

## 2020-10-03 DIAGNOSIS — R059 Cough, unspecified: Secondary | ICD-10-CM | POA: Diagnosis not present

## 2020-10-03 DIAGNOSIS — U071 COVID-19: Secondary | ICD-10-CM | POA: Diagnosis not present

## 2020-10-03 DIAGNOSIS — J3489 Other specified disorders of nose and nasal sinuses: Secondary | ICD-10-CM | POA: Diagnosis not present

## 2020-10-03 MED ORDER — PREDNISONE 20 MG PO TABS: ORAL_TABLET | ORAL | 0 refills | Status: AC

## 2020-10-03 MED ORDER — AZITHROMYCIN 250 MG PO TABS: ORAL_TABLET | ORAL | 0 refills | Status: DC

## 2020-10-03 NOTE — Progress Notes (Signed)
I connected with  Tharon Bolas Ostroff on 10/03/20 by a video enabled telemedicine application and verified that I am speaking with the correct person using two identifiers.   I discussed the limitations of evaluation and management by telemedicine. The patient expressed understanding and agreed to proceed.    Acute Office Visit  Subjective:    Patient ID: [name], female    DOB: Dec 28, 1985, 35 y.o.   MRN: [id number]  Chief Complaint  Patient presents with   Covid Positive   Virtual Visit via Video Note  I connected with Alexxis Fleurimond Hayman on 10/03/20 at  9:00 AM EST by a video enabled telemedicine application and verified that I am speaking with the correct person using two identifiers.  Location: Patient: Home Provider: Working remotely from home   I discussed the limitations of evaluation and management by telemedicine and the availability of in person appointments. The patient expressed understanding and agreed to proceed.  History of Present Illness: States that she was diagnosed with COVID on January 15 reports that she has been having elevated temperatures between 100 and 140s that she has been having productive cough with green sputum, green nasal discharge, sinus pressure, ear pain, lack of taste and smell.  Reports that she is eating and drinking okay denies shortness of breath.  Reports that she has been using Mucinex, Tylenol, ibuprofen, Nettie pot without much relief.  Endorses 2 Pfizer COVID vaccines, with last one in February 2021.     Observations/Objective: Medical history and current medications reviewed, no physical exam completed    Past Medical History:  Diagnosis Date   Anxiety    Asthma    CHILDHOOD   Bacterial vaginitis    Chronic anal fissure    Chronic kidney disease    INTERTERSIAL CYSTITIS   Complication of anesthesia    has concerns from anesthesia from  first  CS, breathing problems, 2nd csection legs numb   Contact dermatitis due  to adhesives 07/06/2017   Frequency of urination    GERD (gastroesophageal reflux disease)    Headache    MIGRAINES    Heart murmur    mild no cardiologist   History of pre-eclampsia    Hypothyroidism    Interstitial cystitis    LGSIL on Pap smear of cervix 2010   Nocturia    Pelvic pain in female    PONV (postoperative nausea and vomiting)    Spotting between menses    Urgency of urination     Past Surgical History:  Procedure Laterality Date   CESAREAN SECTION N/A 07/05/2017   Procedure: CESAREAN SECTION;  Surgeon: Brien Few, MD;  Location: [place];  Service: Obstetrics;  Laterality: N/A;   CESAREAN SECTION WITH BILATERAL TUBAL LIGATION N/A 11/21/2019   Procedure: CESAREAN SECTION WITH BILATERAL TUBAL LIGATION;  Surgeon: Brien Few, MD;  Location: [facility];  Service: Obstetrics;  Laterality: N/A;   CHROMOPERTUBATION Bilateral 07/23/2016   Procedure: CHROMOPERTUBATION;  Surgeon: Brien Few, MD;  Location: [facility];  Service: Gynecology;  Laterality: Bilateral;   COLPOSCOPY  2010   CYSTO WITH HYDRODISTENSION  10/14/2011   Procedure: CYSTOSCOPY/HYDRODISTENSION;  Surgeon: Malka So, MD;  Location: [facility];  Service: Urology;  Laterality: N/A;  M & P    DILATION AND CURETTAGE OF UTERUS  09/16/2011   Procedure: DILATATION AND CURETTAGE;  Surgeon: Gus Height;  Location: [facility];  Service: Gynecology;  Laterality: N/A;  Endometrial Biopsy, Urethral Dilation   DILATION AND EVACUATION  N/A 02/26/2016   Procedure: DILATATION AND EVACUATION/Chromosome Analysis;  Surgeon: Brien Few, MD;  Location: [facility];  Service: Gynecology;  Laterality: N/A;  with chromosome studies    DILITATION & CURRETTAGE/HYSTROSCOPY WITH NOVASURE ABLATION N/A 07/03/2020   Procedure: DILATATION & CURETTAGE/HYSTEROSCOPY WITH NOVASURE ABLATION;  Surgeon: Brien Few, MD;  Location: [facility];  Service: Gynecology;  Laterality: N/A;    LAPAROSCOPIC LYSIS OF ADHESIONS     LAPAROSCOPY  09/16/2011   Procedure: LAPAROSCOPY OPERATIVE;  Surgeon: Gus Height;  Location: [facility];  Service: Gynecology;  Laterality: N/A;   LAPAROSCOPY N/A 07/23/2016   Procedure: LAPAROSCOPY OPERATIVE, LYSIS OF ADHESIONS;  Surgeon: Brien Few, MD;  Location: [facility];  Service: Gynecology;  Laterality: N/A;   NOSE SURGERY  2015   turbinate reduction   SEPTOPLASTY N/A 12/24/2019   Procedure: SEPTOPLASTY;  Surgeon: [name], MD;  Location: [facility];  Service: ENT;  Laterality: N/A;   WISDOM TOOTH EXTRACTION  2008- ORAL SURG OFFICE    Family History  Problem Relation Age of Onset   Healthy Mother    Healthy Father    Hypertension Sister    Colon cancer Paternal Grandfather    Esophageal cancer Maternal Uncle    Stomach cancer Neg Hx    Liver disease Neg Hx     Social History   Socioeconomic History   Marital status: Married    Spouse name: Not on file   Number of children: Not on file   Years of education: Not on file   Highest education level: Not on file  Occupational History   Occupation: MRI technologist    Employer: [employer]  Tobacco Use   Smoking status: Former Smoker    Packs/day: 0.15    Years: 0.50    Pack years: 0.07    Types: Cigarettes    Quit date: 02/25/2011    Years since quitting: 9.6   Smokeless tobacco: Never Used  Vaping Use   Vaping Use: Never used  Substance and Sexual Activity   Alcohol use: No   Drug use: No   Sexual activity: Yes    Birth control/protection: None, Abstinence  Other Topics Concern   Not on file  Social History Narrative   Married 1 son 1 daughter born 2018 in 2021   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     AMBULATORY NON FORMULARY MEDICATION Diltiazem 2% gel mixed with Lidocaine 5% Sig : apply a pea size amount to rectum three times daily 30 g 2   benzonatate (TESSALON PERLES) 100 MG capsule Take 1 capsule (100 mg total) by mouth 3 (three) times daily as needed for cough. 20 capsule 0   butalbital-acetaminophen-caffeine (FIORICET) 50-325-40 MG tablet Take 1-2 tablets by mouth every 4 (four) hours as needed.     citalopram (CELEXA) 20 MG tablet Take 20 mg by mouth at bedtime.      cyclobenzaprine (FLEXERIL) 5 MG  tablet Take 1 tablet (5 mg total) by mouth at bedtime. 30 tablet 5   diphenhydrAMINE (BENADRYL) 25 MG tablet Take 25 mg by mouth at bedtime as needed for allergies or sleep.     docusate sodium (COLACE) 100 MG capsule Take 200 mg by mouth daily.     frovatriptan (FROVA) 2.5 MG tablet Take 2.5 mg by mouth as needed.     ibuprofen (ADVIL) 600 MG tablet Take 600 mg by mouth 3 (three) times daily as needed.     levothyroxine (SYNTHROID) 25 MCG tablet Take 25 mcg by mouth daily before breakfast.      naproxen (NAPROSYN) 500 MG tablet Take 1 tablet (500 mg total) by mouth 2 (two) times daily with a meal. 30 tablet 0   nystatin-triamcinolone ointment (MYCOLOG) Apply 1 application topically 2 (two) times daily. 30 g 0   propranolol (INDERAL) 10 MG tablet Take 1 tablet (10 mg total) by mouth 2 (two) times daily. 60 tablet 11   rizatriptan (MAXALT) 10 MG tablet Take 10 mg by mouth as needed for migraine. May repeat in 2 hours if needed     Semaglutide-Weight Management (WEGOVY [place]) Inject into the skin.     Wheat Dextrin (BENEFIBER) POWD 1-2 tablespoons nightly-twice a day  0   No facility-administered medications  prior to visit.    Allergies  Allergen Reactions   Macrobid [Nitrofurantoin Monohyd Macro] Other (See Comments)    FLU LIKE SYMPTONS   Penicillins Hives    Has patient had a PCN reaction causing immediate rash, facial/tongue/throat swelling, SOB or lightheadedness with hypotension: yes Has patient had a PCN reaction causing severe rash involving mucus membranes or skin necrosis: no Has patient had a PCN reaction that required hospitalization no Has patient had a PCN reaction occurring within the last 10 years: yes If all of the above answers are "NO", then may proceed with Cephalosporin use.     Review of Systems  Constitutional: Positive for chills and fatigue. Negative for fever.  HENT: Positive for congestion, ear pain, facial swelling, sinus pressure and sinus pain. Negative for sore throat and trouble swallowing.   Eyes: Negative.   Respiratory: Positive for cough. Negative for shortness of breath and wheezing.   Cardiovascular: Negative.   Gastrointestinal: Negative for diarrhea, nausea and vomiting.  Endocrine: Negative.   Genitourinary: Negative.   Musculoskeletal: Negative for myalgias.  Allergic/Immunologic: Negative.   Neurological: Positive for headaches.  Hematological: Negative.   Psychiatric/Behavioral: Negative.        Objective:     There were no vitals taken for this visit. Wt Readings from Last 3 Encounters:  08/19/20 205 lb (93 kg)  08/05/20 206 lb 2 oz (93.5 kg)  07/03/20 207 lb (93.9 kg)    Health Maintenance Due  Topic Date Due   Hepatitis C Screening  Never done   COVID-19 Vaccine (1) Never done   TETANUS/TDAP  Never done   PAP SMEAR-Modifier  Never done   INFLUENZA VACCINE  Never done    There are no preventive care reminders to display for this patient.   No results found for: TSH Lab Results  Component Value Date   WBC 8.2 07/03/2020   HGB 14.2 07/03/2020   HCT 42.6 07/03/2020   MCV 90.8 07/03/2020   PLT 410 (H)  07/03/2020   Lab Results  Component Value Date   NA 135 07/04/2017   K 4.0 07/04/2017   CO2 19 (L) 07/04/2017   GLUCOSE 121 (H) 07/04/2017  BUN 10 07/04/2017   CREATININE 0.61 07/04/2017   BILITOT 0.3 07/04/2017   ALKPHOS 136 (H) 07/04/2017   AST 23 07/04/2017   ALT 13 (L) 07/04/2017   PROT 5.8 (L) 07/04/2017   ALBUMIN 2.9 (L) 07/04/2017   CALCIUM 8.7 (L) 07/04/2017   ANIONGAP 9 07/04/2017   No results found for: CHOL No results found for: HDL No results found for: LDLCALC No results found for: TRIG No results found for: CHOLHDL No results found for: HGBA1C     Assessment & Plan:   Problem List Items Addressed This Visit      Other   COVID-19 - Primary   Relevant Medications   azithromycin (ZITHROMAX) 250 MG tablet   predniSONE (DELTASONE) 20 MG tablet    Other Visit Diagnoses    Left ear pain       Relevant Medications   azithromycin (ZITHROMAX) 250 MG tablet   predniSONE (DELTASONE) 20 MG tablet   Cough       Relevant Medications   predniSONE (DELTASONE) 20 MG tablet   Sinus pressure       Relevant Medications   azithromycin (ZITHROMAX) 250 MG tablet   predniSONE (DELTASONE) 20 MG tablet      Assessment and Plan: 1. COVID-19 Trial azithromycin, prednisone taper.  Encourage patient to continue using Mucinex and ibuprofen as needed, encourage patient to use Flonase as needed.  Continue rest, proper hydration.  Red flags given for prompt reevaluation - azithromycin (ZITHROMAX) 250 MG tablet; Take 2 tabs  Dispense: 6 tablet; Refill: 0 - predniSONE (DELTASONE) 20 MG tablet; Take 3 tablets (60 mg total) by mouth daily with breakfast for 2 days, THEN 2 tablets (40 mg total) daily with breakfast for 2 days, THEN 1 tablet (20 mg total) daily with breakfast for 2 days, THEN 0.5 tablets (10 mg total) daily with breakfast for 2 days.  Dispense: 13 tablet; Refill: 0  2. Left ear pain  - azithromycin (ZITHROMAX) 250 MG tablet; Take 2 tabs  Dispense: 6 tablet; Refill:  0 - predniSONE (DELTASONE) 20 MG tablet; Take 3 tablets (60 mg total) by mouth daily with breakfast for 2 days, THEN 2 tablets (40 mg total) daily with breakfast for 2 days, THEN 1 tablet (20 mg total) daily with breakfast for 2 days, THEN 0.5 tablets (10 mg total) daily with breakfast for 2 days.  Dispense: 13 tablet; Refill: 0  3. Cough  - predniSONE (DELTASONE) 20 MG tablet; Take 3 tablets (60 mg total) by mouth daily with breakfast for 2 days, THEN 2 tablets (40 mg total) daily with breakfast for 2 days, THEN 1 tablet (20 mg total) daily with breakfast for 2 days, THEN 0.5 tablets (10 mg total) daily with breakfast for 2 days.  Dispense: 13 tablet; Refill: 0  4. Sinus pressure  - azithromycin (ZITHROMAX) 250 MG tablet; Take 2 tabs  Dispense: 6 tablet; Refill: 0 - predniSONE (DELTASONE) 20 MG tablet; Take 3 tablets (60 mg total) by mouth daily with breakfast for 2 days, THEN 2 tablets (40 mg total) daily with breakfast for 2 days, THEN 1 tablet (20 mg total) daily with breakfast for 2 days, THEN 0.5 tablets (10 mg total) daily with breakfast for 2 days.  Dispense: 13 tablet; Refill: 0  Follow Up Instructions:    I discussed the assessment and treatment plan with the patient. The patient was provided an opportunity to ask questions and all were answered. The patient agreed with the plan and demonstrated an understanding  of the instructions.   The patient was advised to call back or seek an in-person evaluation if the symptoms worsen or if the condition fails to improve as anticipated.  I provided 21 minutes of non-face-to-face time during this encounter.    Meds ordered this encounter  Medications   azithromycin (ZITHROMAX) 250 MG tablet    Sig: Take 2 tabs    Dispense:  6 tablet    Refill:  0    Order Specific Question:   Supervising Provider    Answer:   Elsie Stain [[id number]]   predniSONE (DELTASONE) 20 MG tablet    Sig: Take 3 tablets (60 mg total) by mouth daily with  breakfast for 2 days, THEN 2 tablets (40 mg total) daily with breakfast for 2 days, THEN 1 tablet (20 mg total) daily with breakfast for 2 days, THEN 0.5 tablets (10 mg total) daily with breakfast for 2 days.    Dispense:  13 tablet    Refill:  0    Order Specific Question:   Supervising Provider    Answer:   Asencion Noble E [[id number]]     Vandella Ord Chauncey Cruel Mayers, PA-C

## 2020-10-03 NOTE — Progress Notes (Signed)
Erroneous Encounter - Disregard

## 2020-10-04 ENCOUNTER — Encounter (INDEPENDENT_AMBULATORY_CARE_PROVIDER_SITE_OTHER): Payer: Self-pay

## 2020-10-05 NOTE — Addendum Note (Signed)
Addended by: MAYERS, Castulo Scarpelli S on: 10/05/2020 01:41 PM   Modules accepted: Level of Service  

## 2020-10-06 MED FILL — LEVOTHYROXINE SODIUM 25 MCG: 25 | 60 days supply | Qty: 60 | Fill #4

## 2020-10-14 ENCOUNTER — Ambulatory Visit: Payer: No Typology Code available for payment source | Admitting: Internal Medicine

## 2020-10-14 ENCOUNTER — Other Ambulatory Visit (HOSPITAL_COMMUNITY): Payer: Self-pay | Admitting: Internal Medicine

## 2020-10-14 ENCOUNTER — Other Ambulatory Visit: Payer: Self-pay

## 2020-10-14 ENCOUNTER — Encounter: Payer: Self-pay | Admitting: Internal Medicine

## 2020-10-14 VITALS — BP 110/78 | HR 104 | Ht 65.0 in | Wt 204.3 lb

## 2020-10-14 DIAGNOSIS — K644 Residual hemorrhoidal skin tags: Secondary | ICD-10-CM | POA: Diagnosis not present

## 2020-10-14 DIAGNOSIS — K601 Chronic anal fissure: Secondary | ICD-10-CM

## 2020-10-14 DIAGNOSIS — K582 Mixed irritable bowel syndrome: Secondary | ICD-10-CM | POA: Diagnosis not present

## 2020-10-14 MED ORDER — HYDROCORTISONE ACE-PRAMOXINE 1-1 % EX CREA: 1.0000 "application " | TOPICAL_CREAM | Freq: Every day | CUTANEOUS | 0 refills | Status: DC

## 2020-10-14 MED FILL — HYDROCORT-PRAMOXINE 1%-1% C: 1-1 | 30 days supply | Qty: 30 | Fill #0

## 2020-10-14 NOTE — Patient Instructions (Signed)
We have sent the following medications to your pharmacy for you to pick up at your convenience: Hydrocortisone   Please start out using a teaspoon of benefiber daily, handout provided.   I appreciate the opportunity to care for you. Silvano Rusk, MD, [facility]

## 2020-10-14 NOTE — Progress Notes (Signed)
[name] 35 y.o. 11-Apr-1986 [id number]  Assessment & Plan:   Encounter Diagnoses  Name Primary?  . Chronic anal fissure Yes  . External hemorrhoids with complication   . Irritable bowel syndrome with both constipation and diarrhea     Exacerbation of anorectal symptoms related to diarrhea from Covid plus coughing probably enlarged hemorrhoids. Continue diltiazem and lidocaine for her fissure and treat hemorrhoids with hydrocortisone and pramoxine nightly for 2 weeks. Start back fiber supplementation with 1 teaspoon Benefiber and titrate up.  I appreciate the opportunity to care for this patient. CC: Josetta Huddle, MD  Subjective:   Chief Complaint: Hemorrhoids anal symptoms after diarrhea post Covid  HPI [name] is a 35 year old white woman with a history of a posterior anal fissure seen last in November 2021, as well as a symptomatic external hemorrhoid/skin tag. She had been improving but she developed Covid and then had diarrhea and has had a lot of anal irritation and she felt some protrusion from the right posterior hemorrhoidal area and was concerned that it would develop into another tag which has been quite bothersome. She is continuing to use her diltiazem and lidocaine for anal fissure and thinks that that is overall better though recently is a bit worse. Now after having diarrhea she is having constipation-like symptoms with hard small stools 1 day and then loose stools the next. Currently off Benefiber because of all the diarrhea she had. Allergies  Allergen Reactions  . Macrobid [employer] Macro] Other (See Comments)    FLU LIKE SYMPTONS  . Penicillins Hives    Has patient had a PCN reaction causing immediate rash, facial/tongue/throat swelling, SOB or lightheadedness with hypotension: yes Has patient had a PCN reaction causing severe rash involving mucus membranes or skin necrosis: no Has patient had a PCN reaction that required hospitalization  no Has patient had a PCN reaction occurring within the last 10 years: yes If all of the above answers are "NO", then may proceed with Cephalosporin use.    Current Meds  Medication Sig  . ALPRAZolam (XANAX) 0.5 MG tablet Take 0.5 mg by mouth daily as needed.  . AMBULATORY NON FORMULARY MEDICATION Diltiazem 2% gel mixed with Lidocaine 5% Sig : apply a pea size amount to rectum three times daily  . azithromycin (ZITHROMAX) 250 MG tablet Take 2 tabs  . butalbital-acetaminophen-caffeine (FIORICET) 50-325-40 MG tablet Take 1-2 tablets by mouth every 4 (four) hours as needed.  . citalopram (CELEXA) 20 MG tablet Take 20 mg by mouth at bedtime.   . cyclobenzaprine (FLEXERIL) 5 MG tablet Take 1 tablet (5 mg total) by mouth at bedtime.  . diphenhydrAMINE (BENADRYL) 25 MG tablet Take 25 mg by mouth at bedtime as needed for allergies or sleep.  . frovatriptan (FROVA) 2.5 MG tablet Take 2.5 mg by mouth as needed.  Marland Kitchen ibuprofen (ADVIL) 600 MG tablet Take 600 mg by mouth 3 (three) times daily as needed.  Marland Kitchen levothyroxine (SYNTHROID) 25 MCG tablet Take 25 mcg by mouth daily before breakfast.   . naproxen (NAPROSYN) 500 MG tablet Take 1 tablet (500 mg total) by mouth 2 (two) times daily with a meal.  . nystatin-triamcinolone ointment (MYCOLOG) Apply 1 application topically 2 (two) times daily.  . propranolol (INDERAL) 10 MG tablet Take 1 tablet (10 mg total) by mouth 2 (two) times daily.  . rizatriptan (MAXALT) 10 MG tablet Take 10 mg by mouth as needed for migraine. May repeat in 2 hours if needed  . Semaglutide-Weight  Management (WEGOVY [place]) Inject into the skin.   Past Medical History:  Diagnosis Date  . Anxiety   . Asthma    CHILDHOOD  . Bacterial vaginitis   . Chronic anal fissure   . Chronic kidney disease    INTERTERSIAL CYSTITIS  . Complication of anesthesia    has concerns from anesthesia from  first  CS, breathing problems, 2nd csection legs numb  . Contact dermatitis due to adhesives  07/06/2017  . Frequency of urination   . GERD (gastroesophageal reflux disease)   . Headache    MIGRAINES   . Heart murmur    mild no cardiologist  . History of pre-eclampsia   . Hypothyroidism   . Interstitial cystitis   . LGSIL on Pap smear of cervix 2010  . Nocturia   . Pelvic pain in female   . PONV (postoperative nausea and vomiting)   . Spotting between menses   . Urgency of urination    Past Surgical History:  Procedure Laterality Date  . CESAREAN SECTION N/A 07/05/2017   Procedure: CESAREAN SECTION;  Surgeon: Brien Few, MD;  Location: [place];  Service: Obstetrics;  Laterality: N/A;  . CESAREAN SECTION WITH BILATERAL TUBAL LIGATION N/A 11/21/2019   Procedure: CESAREAN SECTION WITH BILATERAL TUBAL LIGATION;  Surgeon: Brien Few, MD;  Location: [facility] [facility];  Service: Obstetrics;  Laterality: N/A;  . CHROMOPERTUBATION Bilateral 07/23/2016   Procedure: CHROMOPERTUBATION;  Surgeon: Brien Few, MD;  Location: [facility];  Service: Gynecology;  Laterality: Bilateral;  . COLPOSCOPY  2010  . CYSTO WITH HYDRODISTENSION  10/14/2011   Procedure: CYSTOSCOPY/HYDRODISTENSION;  Surgeon: Malka So, MD;  Location: [facility];  Service: Urology;  Laterality: N/A;  [place] AND CURETTAGE OF UTERUS  09/16/2011   Procedure: DILATATION AND CURETTAGE;  Surgeon: Gus Height;  Location: [facility] [place] [facility];  Service: Gynecology;  Laterality: N/A;  Endometrial Biopsy, Urethral Dilation  . DILATION AND EVACUATION N/A 02/26/2016   Procedure: DILATATION AND EVACUATION/Chromosome Analysis;  Surgeon: Brien Few, MD;  Location: [facility];  Service: Gynecology;  Laterality: N/A;  with chromosome studies   . DILITATION & CURRETTAGE/HYSTROSCOPY WITH NOVASURE ABLATION N/A 07/03/2020   Procedure: DILATATION & CURETTAGE/HYSTEROSCOPY WITH NOVASURE ABLATION;  Surgeon: Brien Few, MD;  Location: [place];  Service: Gynecology;  Laterality: N/A;  . LAPAROSCOPIC  LYSIS OF ADHESIONS    . LAPAROSCOPY  09/16/2011   Procedure: LAPAROSCOPY OPERATIVE;  Surgeon: Gus Height;  Location: [facility];  Service: Gynecology;  Laterality: N/A;  . LAPAROSCOPY N/A 07/23/2016   Procedure: LAPAROSCOPY OPERATIVE, LYSIS OF ADHESIONS;  Surgeon: Brien Few, MD;  Location: [facility];  Service: Gynecology;  Laterality: N/A;  . NOSE SURGERY  2015   turbinate reduction  . SEPTOPLASTY N/A 12/24/2019   Procedure: SEPTOPLASTY;  Surgeon: [name], MD;  Location: [facility];  Service: ENT;  Laterality: N/A;  . 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   Review of Systems   Objective:   Physical Exam BP 110/78 (BP Location: Left  Arm, Patient Position: Sitting)   Pulse (!) 104   Ht 5\' 5"  (1.651 m)   Wt 204 lb 4.8 oz (92.7 kg)   SpO2 99%   BMI 34.00 kg/m   Genella Mech, CMA present  Rectal exam shows a midline posterior fleshy tag that is small the anoderm looks normal there is no dermatitis is in the past digital exam shows some tenderness posteriorly more so than anterior with some induration in the posterior anal canal.  Anoscopic exam is performed and shows a right posterior external hemorrhoid that is swollen and just barely grade 2 the others are grade 1. Internal hemorrhoids grade 1 and not inflamed. Posterior fissure is seen.

## 2020-10-15 ENCOUNTER — Encounter: Payer: Self-pay | Admitting: Adult Health

## 2020-10-25 ENCOUNTER — Other Ambulatory Visit (HOSPITAL_COMMUNITY): Payer: Self-pay | Admitting: Obstetrics and Gynecology

## 2020-10-25 MED FILL — WEGOVY 1 MG/0.5ML SOAJ: 1 | 28 days supply | Qty: 2 | Fill #0

## 2020-12-02 ENCOUNTER — Other Ambulatory Visit (HOSPITAL_BASED_OUTPATIENT_CLINIC_OR_DEPARTMENT_OTHER): Payer: Self-pay

## 2020-12-04 ENCOUNTER — Other Ambulatory Visit (HOSPITAL_COMMUNITY): Payer: Self-pay | Admitting: Obstetrics and Gynecology

## 2020-12-04 ENCOUNTER — Encounter: Payer: Self-pay | Admitting: Adult Health

## 2020-12-04 MED FILL — WEGOVY 1 MG/0.5ML SOAJ: 1 | 28 days supply | Qty: 2 | Fill #0

## 2020-12-05 ENCOUNTER — Other Ambulatory Visit (HOSPITAL_COMMUNITY): Payer: Self-pay | Admitting: Obstetrics and Gynecology

## 2020-12-05 MED FILL — LEVOTHYROXINE SODIUM 25 MCG: 25 | 30 days supply | Qty: 30 | Fill #0

## 2020-12-08 ENCOUNTER — Other Ambulatory Visit (HOSPITAL_COMMUNITY): Payer: Self-pay | Admitting: Internal Medicine

## 2020-12-08 MED FILL — predniSONE 10 MG TABS: 10 | 10 days supply | Qty: 30 | Fill #0

## 2020-12-17 ENCOUNTER — Other Ambulatory Visit (HOSPITAL_COMMUNITY): Payer: Self-pay

## 2020-12-17 MED FILL — Propranolol HCl Tab 10 MG: ORAL | 30 days supply | Qty: 60 | Fill #0 | Status: AC

## 2020-12-24 ENCOUNTER — Other Ambulatory Visit (HOSPITAL_COMMUNITY): Payer: Self-pay

## 2020-12-24 ENCOUNTER — Other Ambulatory Visit (HOSPITAL_COMMUNITY): Payer: Self-pay | Admitting: Obstetrics and Gynecology

## 2020-12-25 ENCOUNTER — Other Ambulatory Visit (HOSPITAL_COMMUNITY): Payer: Self-pay

## 2020-12-25 MED ORDER — WEGOVY 1.7 MG/0.75ML ~~LOC~~ SOAJ
SUBCUTANEOUS | 0 refills | Status: DC
  Filled 2020-12-25: qty 3, 30d supply, fill #0

## 2020-12-25 MED ORDER — WEGOVY 1 MG/0.5ML ~~LOC~~ SOAJ
SUBCUTANEOUS | 0 refills | Status: DC
  Filled 2020-12-25: qty 2, fill #0

## 2021-01-06 ENCOUNTER — Other Ambulatory Visit: Payer: Self-pay | Admitting: Obstetrics and Gynecology

## 2021-01-06 DIAGNOSIS — N6320 Unspecified lump in the left breast, unspecified quadrant: Secondary | ICD-10-CM

## 2021-01-06 DIAGNOSIS — N6001 Solitary cyst of right breast: Secondary | ICD-10-CM

## 2021-01-07 ENCOUNTER — Other Ambulatory Visit: Payer: Self-pay | Admitting: Obstetrics and Gynecology

## 2021-01-07 DIAGNOSIS — N644 Mastodynia: Secondary | ICD-10-CM

## 2021-01-07 DIAGNOSIS — N6310 Unspecified lump in the right breast, unspecified quadrant: Secondary | ICD-10-CM

## 2021-01-07 DIAGNOSIS — N6320 Unspecified lump in the left breast, unspecified quadrant: Secondary | ICD-10-CM

## 2021-01-08 ENCOUNTER — Other Ambulatory Visit (HOSPITAL_COMMUNITY): Payer: Self-pay

## 2021-01-08 MED ORDER — MUPIROCIN 2 % EX OINT
TOPICAL_OINTMENT | CUTANEOUS | 1 refills | Status: DC
Start: 2021-01-08 — End: 2021-01-08
  Filled 2021-01-08: qty 22, 10d supply, fill #0

## 2021-01-09 ENCOUNTER — Ambulatory Visit
Admission: RE | Admit: 2021-01-09 | Discharge: 2021-01-09 | Disposition: A | Discharge status: Home / Self Care | Payer: No Typology Code available for payment source | Source: Ambulatory Visit | Attending: Obstetrics and Gynecology | Admitting: Obstetrics and Gynecology

## 2021-01-09 ENCOUNTER — Other Ambulatory Visit: Payer: Self-pay | Admitting: Obstetrics and Gynecology

## 2021-01-09 ENCOUNTER — Ambulatory Visit: Payer: No Typology Code available for payment source

## 2021-01-09 ENCOUNTER — Other Ambulatory Visit: Payer: Self-pay

## 2021-01-09 DIAGNOSIS — N6310 Unspecified lump in the right breast, unspecified quadrant: Secondary | ICD-10-CM

## 2021-01-09 DIAGNOSIS — N644 Mastodynia: Secondary | ICD-10-CM

## 2021-01-09 DIAGNOSIS — N6320 Unspecified lump in the left breast, unspecified quadrant: Secondary | ICD-10-CM

## 2021-01-20 ENCOUNTER — Other Ambulatory Visit (HOSPITAL_COMMUNITY): Payer: Self-pay

## 2021-01-20 MED ORDER — WEGOVY 1.7 MG/0.75ML ~~LOC~~ SOAJ
SUBCUTANEOUS | 0 refills | Status: DC
  Filled 2021-01-20: qty 3, 28d supply, fill #0

## 2021-01-20 MED FILL — Propranolol HCl Tab 10 MG: ORAL | 30 days supply | Qty: 60 | Fill #1 | Status: AC

## 2021-01-21 ENCOUNTER — Other Ambulatory Visit (HOSPITAL_COMMUNITY): Payer: Self-pay

## 2021-01-29 ENCOUNTER — Other Ambulatory Visit (HOSPITAL_COMMUNITY): Payer: Self-pay

## 2021-01-29 ENCOUNTER — Other Ambulatory Visit: Payer: Self-pay

## 2021-01-29 MED ORDER — HYDROCORTISONE ACE-PRAMOXINE 1-1 % EX CREA
1.0000 "application " | TOPICAL_CREAM | Freq: Every day | CUTANEOUS | 0 refills | Status: DC
  Filled 2021-01-29: qty 30, 30d supply, fill #0

## 2021-01-30 ENCOUNTER — Other Ambulatory Visit (HOSPITAL_COMMUNITY): Payer: Self-pay

## 2021-02-17 ENCOUNTER — Other Ambulatory Visit (HOSPITAL_COMMUNITY): Payer: Self-pay

## 2021-02-17 MED ORDER — WEGOVY 1.7 MG/0.75ML ~~LOC~~ SOAJ
SUBCUTANEOUS | 0 refills | Status: DC
  Filled 2021-02-17: qty 3, 28d supply, fill #0

## 2021-02-17 MED FILL — Propranolol HCl Tab 10 MG: ORAL | 30 days supply | Qty: 60 | Fill #2 | Status: AC

## 2021-02-20 ENCOUNTER — Other Ambulatory Visit (HOSPITAL_COMMUNITY): Payer: Self-pay

## 2021-02-24 ENCOUNTER — Ambulatory Visit: Payer: No Typology Code available for payment source | Admitting: Adult Health

## 2021-03-12 ENCOUNTER — Other Ambulatory Visit (HOSPITAL_COMMUNITY): Payer: Self-pay

## 2021-03-12 MED ORDER — WEGOVY 1.7 MG/0.75ML ~~LOC~~ SOAJ
SUBCUTANEOUS | 0 refills | Status: DC
  Filled 2021-03-12: qty 3, 28d supply, fill #0

## 2021-03-12 MED FILL — Propranolol HCl Tab 20 MG: ORAL | 30 days supply | Qty: 60 | Fill #0 | Status: AC

## 2021-03-12 MED FILL — Cyclobenzaprine HCl Tab 5 MG: ORAL | 30 days supply | Qty: 30 | Fill #0 | Status: AC

## 2021-03-17 ENCOUNTER — Encounter: Payer: Self-pay | Admitting: Adult Health

## 2021-03-17 ENCOUNTER — Ambulatory Visit (INDEPENDENT_AMBULATORY_CARE_PROVIDER_SITE_OTHER): Payer: No Typology Code available for payment source | Admitting: Adult Health

## 2021-03-17 ENCOUNTER — Other Ambulatory Visit (HOSPITAL_COMMUNITY): Payer: Self-pay

## 2021-03-17 VITALS — BP 107/75 | HR 99 | Ht 65.0 in | Wt 184.0 lb

## 2021-03-17 DIAGNOSIS — G43709 Chronic migraine without aura, not intractable, without status migrainosus: Secondary | ICD-10-CM

## 2021-03-17 DIAGNOSIS — M542 Cervicalgia: Secondary | ICD-10-CM | POA: Diagnosis not present

## 2021-03-17 NOTE — Progress Notes (Signed)
PATIENT: Ashley [name] DOB: [birth year]  REASON FOR VISIT: follow up HISTORY FROM: patient Primary neurologist: Dr. Brett Fairy  HISTORY OF PRESENT ILLNESS: Today 03/17/21: Ashley [name] is a 35 year old [name] with a history of migraine headaches.  She returns today for follow-up.  At the last visit we reduced her dose of propranolol to 10 mg twice a day.  She continues to take Flexeril 5 mg at bedtime as needed.  States that she uses this approximately 3 times a month.  If she gets a headache she normally will take frovatriptan.  And this works well for her.  She does state that she does not like taking daily medication for her migraines however right now they are controlled so she does not want to adjust her medication.  08/19/20: Ashley [name] is a 35 year old [name] with a history of migraine headaches.  She returns today for follow-up.  She is currently taking propranolol 20 mg daily.  She states that she try taking 20 mg twice a day but the morning dose made her feel very tired.  She states that during the day she sometimes feels like her heart is racing.  This is not always due to stressful situations.  She continues to take Flexeril at bedtime.  She thinks that it has been helpful for tightness in the neck.  She reports that she has approximately 4 headaches a month.  Since the last visit she is only had to use frovatriptan 3 times.  She does feel like the medication has been helpful.  Ideally she would like to build to wean off of some of the medicine in the future.    REVIEW OF SYSTEMS: Out of a complete 14 system review of symptoms, the patient complains only of the following symptoms, and all other reviewed systems are negative.  See HPI  ALLERGIES: Allergies  Allergen Reactions   Macrobid [Nitrofurantoin Monohyd Macro] Other (See Comments)    FLU LIKE SYMPTONS   Penicillins Hives    Has patient had a PCN reaction causing immediate rash, facial/tongue/throat swelling, SOB or  lightheadedness with hypotension: yes Has patient had a PCN reaction causing severe rash involving mucus membranes or skin necrosis: no Has patient had a PCN reaction that required hospitalization no Has patient had a PCN reaction occurring within the last 10 years: yes If all of the above answers are "NO", then may proceed with Cephalosporin use.     HOME MEDICATIONS: Outpatient Medications Prior to Visit  Medication Sig Dispense Refill   ALPRAZolam (XANAX) 0.5 MG tablet Take 0.5 mg by mouth daily as needed.     AMBULATORY NON FORMULARY MEDICATION Diltiazem 2% gel mixed with Lidocaine 5% Sig : apply a pea size amount to rectum three times daily 30 g 2   azithromycin (ZITHROMAX) 250 MG tablet Take 2 tabs 6 tablet 0   benzonatate (TESSALON) 100 MG capsule TAKE 1 CAPSULE BY MOUTH 3 TIMES DAILY AS NEEDED FOR COUGH 20 capsule 0   butalbital-acetaminophen-caffeine (FIORICET) 50-325-40 MG tablet Take 1-2 tablets by mouth every 4 (four) hours as needed.     citalopram (CELEXA) 10 MG tablet TAKE 1 TABLET BY MOUTH ONCE DAILY 30 tablet 10   citalopram (CELEXA) 20 MG tablet Take 20 mg by mouth at bedtime.      cyclobenzaprine (FLEXERIL) 5 MG tablet Take 1 tablet (5 mg total) by mouth at bedtime. 30 tablet 5   cyclobenzaprine (FLEXERIL) 5 MG tablet TAKE 1 TABLET BY MOUTH AT BEDTIME 30 tablet  5   diphenhydrAMINE (BENADRYL) 25 MG tablet Take 25 mg by mouth at bedtime as needed for allergies or sleep.     fluconazole (DIFLUCAN) 150 MG tablet TAKE 1 TABLET BY MOUTH EVERY 72 HOURS AS NEEDED ON DAYS 1, 4 AND 7 3 tablet 0   frovatriptan (FROVA) 2.5 MG tablet Take 2.5 mg by mouth as needed.     frovatriptan (FROVA) 2.5 MG tablet TAKE 1 TABLET BY MOUTH AS NEEDED FOR MIGRAINE HEADACHE 15 tablet 3   ibuprofen (ADVIL) 600 MG tablet Take 600 mg by mouth 3 (three) times daily as needed.     ibuprofen (ADVIL) 600 MG tablet TAKE 1 TABLET BY MOUTH 3 TIMES DAILY AS NEEDED WITH FOOD OR MILK 90 tablet 1   ibuprofen  (ADVIL) 600 MG tablet TAKE 1 TABLET BY MOUTH THREE TIMES DAILY WITH FOOD OR MILK AS NEEDED. 90 tablet 1   levothyroxine (SYNTHROID) 25 MCG tablet Take 25 mcg by mouth daily before breakfast.      levothyroxine (SYNTHROID) 25 MCG tablet TAKE 1 TABLET BY MOUTH ONCE A DAY 30 tablet 0   levothyroxine (SYNTHROID) 25 MCG tablet TAKE 1 TABLET BY MOUTH ONCE DAILY (RECHECK LEVEL IN 6 TO 8 WEEKS) 30 tablet 9   naproxen (NAPROSYN) 500 MG tablet Take 1 tablet (500 mg total) by mouth 2 (two) times daily with a meal. 30 tablet 0   naproxen (NAPROSYN) 500 MG tablet TAKE 1 TABLET BY MOUTH 2 TIMES DAILY WITH A MEAL 30 tablet 0   nystatin-triamcinolone ointment (MYCOLOG) Apply 1 application topically 2 (two) times daily. 30 g 0   nystatin-triamcinolone ointment (MYCOLOG) APPLY TO THE AFFECTED AREA(S) TOPICALLY TWO TIMES DAILY 30 g 0   pramoxine-hydrocortisone (PROCTOCREAM-HC) 1-1 % rectal cream Place 1 application rectally at bedtime. 30 g 0   pramoxine-hydrocortisone (PROCTOCREAM-HC) 1-1 % rectal cream APPLY RECTALLY AT BEDTIME. 30 g 0   predniSONE (DELTASONE) 10 MG tablet TAKE 5 TABLETS BY MOUTH DAILY FOR 2 DAYS,4 DAILY X2DAYS, 3 DAILY X2 DAYS, 2 DAILY X2 DAYS THEN 1 DAILY X 2 DAYS 30 tablet 0   propranolol (INDERAL) 10 MG tablet Take 1 tablet (10 mg total) by mouth 2 (two) times daily. 60 tablet 11   propranolol (INDERAL) 10 MG tablet TAKE 1 TABLET BY MOUTH 2 TIMES DAILY. 60 tablet 11   propranolol (INDERAL) 20 MG tablet TAKE 1 TABLET (20 MG TOTAL) BY MOUTH 2 (TWO) TIMES DAILY. 60 tablet 6   rizatriptan (MAXALT) 10 MG tablet Take 10 mg by mouth as needed for migraine. May repeat in 2 hours if needed     Semaglutide-Weight Management (WEGOVY [place]) Inject into the skin.     Semaglutide-Weight Management (WEGOVY) 1 MG/0.5ML SOAJ INJECT 1 MG (1 SYRINGE) UNDER THE SKIN ONCE WEEKLY (ON THE SAME DAY EACH WEEK) 2 mL 0   Semaglutide-Weight Management (WEGOVY) 1.7 MG/0.75ML SOAJ Inject 1.7 mg into the skin once a week as  directed 3 mL 0   Semaglutide-Weight Management 1 MG/0.5ML SOAJ INJECT 1 MG (1 SYRINGE) UNDER THE SKIN ONCE WEEKLY (ON THE SAME DAY EACH WEEK) 2 mL 0   Semaglutide-Weight Management 1 MG/0.5ML SOAJ INJECT 1 MG (1 SYRINGE) UNDER THE SKIN ONCE WEEKLY (ON THE SAME DAY EACH WEEK) 2 mL 0   Wheat Dextrin (BENEFIBER) POWD 1-2 tablespoons nightly-twice a day (Patient not taking: Reported on 10/14/2020)  0   No facility-administered medications prior to visit.    PAST MEDICAL HISTORY: Past Medical History:  Diagnosis Date  Anxiety    Asthma    CHILDHOOD   Bacterial vaginitis    Chronic anal fissure    Chronic kidney disease    INTERTERSIAL CYSTITIS   Complication of anesthesia    has concerns from anesthesia from  first  CS, breathing problems, 2nd csection legs numb   Contact dermatitis due to adhesives 07/06/2017   COVID-19    Frequency of urination    GERD (gastroesophageal reflux disease)    Headache    MIGRAINES    Heart murmur    mild no cardiologist   History of pre-eclampsia    Hypothyroidism    Interstitial cystitis    LGSIL on Pap smear of cervix 2010   Nocturia    Pelvic pain in [name]    PONV (postoperative nausea and vomiting)    Spotting between menses    Urgency of urination     PAST SURGICAL HISTORY: Past Surgical History:  Procedure Laterality Date   CESAREAN SECTION N/A 07/05/2017   Procedure: CESAREAN SECTION;  Surgeon: Brien Few, MD;  Location: [facility];  Service: Obstetrics;  Laterality: N/A;   CESAREAN SECTION WITH BILATERAL TUBAL LIGATION N/A 11/21/2019   Procedure: CESAREAN SECTION WITH BILATERAL TUBAL LIGATION;  Surgeon: Brien Few, MD;  Location: [facility];  Service: Obstetrics;  Laterality: N/A;   CHROMOPERTUBATION Bilateral 07/23/2016   Procedure: CHROMOPERTUBATION;  Surgeon: Brien Few, MD;  Location: [facility];  Service: Gynecology;  Laterality: Bilateral;   COLPOSCOPY  2010   CYSTO WITH HYDRODISTENSION  10/14/2011   Procedure:  CYSTOSCOPY/HYDRODISTENSION;  Surgeon: Malka So, MD;  Location: [facility];  Service: Urology;  Laterality: N/A;  M & P    DILATION AND CURETTAGE OF UTERUS  09/16/2011   Procedure: DILATATION AND CURETTAGE;  Surgeon: Gus Height;  Location: [facility];  Service: Gynecology;  Laterality: N/A;  Endometrial Biopsy, Urethral Dilation   DILATION AND EVACUATION N/A 02/26/2016   Procedure: DILATATION AND EVACUATION/Chromosome Analysis;  Surgeon: Brien Few, MD;  Location: [facility];  Service: Gynecology;  Laterality: N/A;  with chromosome studies    DILITATION & CURRETTAGE/HYSTROSCOPY WITH NOVASURE ABLATION N/A 07/03/2020   Procedure: DILATATION & CURETTAGE/HYSTEROSCOPY WITH NOVASURE ABLATION;  Surgeon: Brien Few, MD;  Location: [facility];  Service: Gynecology;  Laterality: N/A;   LAPAROSCOPIC LYSIS OF ADHESIONS     LAPAROSCOPY  09/16/2011   Procedure: LAPAROSCOPY OPERATIVE;  Surgeon: Gus Height;  Location: [facility];  Service: Gynecology;  Laterality: N/A;   LAPAROSCOPY N/A 07/23/2016   Procedure: LAPAROSCOPY OPERATIVE, LYSIS OF ADHESIONS;  Surgeon: Brien Few, MD;  Location: [facility];  Service: Gynecology;  Laterality: N/A;   NOSE SURGERY  2015   turbinate reduction   SEPTOPLASTY N/A 12/24/2019   Procedure: SEPTOPLASTY;  Surgeon: [name], MD;  Location: [facility];  Service: ENT;  Laterality: N/A;   WISDOM TOOTH EXTRACTION  2008- ORAL SURG OFFICE    FAMILY HISTORY: Family History  Problem Relation Age of Onset   Healthy Mother    Healthy Father    Hypertension Sister    Colon cancer Paternal Grandfather    Esophageal cancer Maternal Uncle    Stomach cancer Neg Hx    Liver disease Neg Hx     SOCIAL HISTORY: Social History   Socioeconomic History   Marital status: Married    Spouse name: Not on file   Number of children: Not on file   Years of education: Not on file   Highest education  level: Not on file  Occupational History    Occupation: MRI technologist    Employer: [employer]  Tobacco Use   Smoking status: Former    Packs/day: 0.15    Years: 0.50    Pack years: 0.08    Types: Cigarettes    Quit date: 02/25/2011    Years since quitting: 10.0   Smokeless tobacco: Never  Vaping Use   Vaping Use: Never used  Substance and Sexual Activity   Alcohol use: No   Drug use: No   Sexual activity: Yes    Birth control/protection: None, Abstinence  Other Topics Concern   Not on file  Social History Narrative   Married 1 son 1 daughter born 2018 in 2021   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  There is no height or weight on file to calculate BMI.  Generalized: Well developed, in no acute distress   Neurological examination  Mentation: Alert oriented to time, place, history taking. Follows all commands speech and language fluent Cranial nerve II-XII: Pupils were equal round reactive to light. Extraocular movements were full, visual field were full on confrontational test. Head turning and shoulder shrug  were normal and symmetric. Motor: The motor testing reveals 5 over 5 strength of all 4 extremities. Good symmetric motor tone is noted throughout.  Sensory: Sensory testing is intact to soft touch on all 4 extremities. No evidence of extinction is noted.  Coordination: Cerebellar testing reveals good finger-nose-finger and heel-to-shin bilaterally.  Gait and station: Gait is normal.  Reflexes: Deep tendon reflexes are symmetric and normal bilaterally.   DIAGNOSTIC DATA (LABS, IMAGING,  TESTING) - I reviewed patient records, labs, notes, testing and imaging myself where available.  Lab Results  Component Value Date   WBC 8.2 07/03/2020   HGB 14.2 07/03/2020   HCT 42.6 07/03/2020   MCV 90.8 07/03/2020   PLT 410 (H) 07/03/2020      Component Value Date/Time   NA 135 07/04/2017 1743   K 4.0 07/04/2017 1743   CL 107 07/04/2017 1743   CO2 19 (L) 07/04/2017 1743   GLUCOSE 121 (H) 07/04/2017 1743   BUN 10 07/04/2017 1743   CREATININE 0.61 07/04/2017 1743   CALCIUM 8.7 (L) 07/04/2017 1743   PROT 5.8 (L) 07/04/2017 1743   ALBUMIN 2.9 (L) 07/04/2017 1743   AST 23 07/04/2017 1743   ALT 13 (L) 07/04/2017 1743   ALKPHOS 136 (H) 07/04/2017 1743   BILITOT 0.3 07/04/2017 1743   GFRNONAA >60 07/04/2017 1743   GFRAA >60 07/04/2017 1743      ASSESSMENT AND PLAN 35 y.o. year old [name]  has a past medical history of Anxiety, Asthma, Bacterial vaginitis, Chronic anal fissure, Chronic kidney disease, Complication of anesthesia, Contact dermatitis due to adhesives (07/06/2017), COVID-19, Frequency of urination, GERD (gastroesophageal reflux disease), Headache, Heart murmur, History of pre-eclampsia, Hypothyroidism, Interstitial cystitis, LGSIL on Pap smear of cervix (2010), Nocturia, Pelvic pain in [name], PONV (postoperative nausea and vomiting), Spotting between menses, and Urgency of urination.  here with:  1.  Migraine headaches 2.  Cervicalgia  -Continue propranolol 10 mg twice a day -Continue Flexeril 5 mg at bedtime -Continue frovatriptan 2.5 mg at the onset of a migraine and can repeat in 2 hours if needed -Advised that in the future if she decides to wean off of propranolol she can call for instructions. -Advised if headache frequency or severity increases she should let us know -Follow-up in 6 months or sooner if needed   Ward Givens, MSN, NP-C 03/17/2021, 10:08 AM [facility] [address] [place], [place] [address] [phone]

## 2021-03-17 NOTE — Patient Instructions (Addendum)
-  Continue propranolol 10 mg twice a day -Continue Flexeril 5 mg at bedtime if PRN  - Neck exercises daily -Continue frovatriptan 2.5 mg at the onset of a migraine and can repeat in 2 hours if needed

## 2021-03-18 ENCOUNTER — Other Ambulatory Visit (HOSPITAL_COMMUNITY): Payer: Self-pay

## 2021-03-18 MED ORDER — FLUCONAZOLE 150 MG PO TABS
ORAL_TABLET | ORAL | 0 refills | Status: DC
Start: 2021-03-18 — End: 2022-09-09
  Filled 2021-03-18: qty 3, 9d supply, fill #0

## 2021-04-02 ENCOUNTER — Other Ambulatory Visit (HOSPITAL_COMMUNITY): Payer: Self-pay

## 2021-04-02 ENCOUNTER — Other Ambulatory Visit: Payer: Self-pay | Admitting: Neurology

## 2021-04-02 MED ORDER — ALPRAZOLAM 0.5 MG PO TABS
ORAL_TABLET | ORAL | 1 refills | Status: DC
  Filled 2021-04-02: qty 10, 30d supply, fill #0
  Filled 2021-06-09: qty 10, 30d supply, fill #1

## 2021-04-21 ENCOUNTER — Other Ambulatory Visit (HOSPITAL_COMMUNITY): Payer: Self-pay

## 2021-04-21 MED ORDER — WEGOVY 1.7 MG/0.75ML ~~LOC~~ SOAJ
SUBCUTANEOUS | 0 refills | Status: DC
  Filled 2021-04-21 – 2021-05-04 (×2): qty 3, 28d supply, fill #0

## 2021-05-04 ENCOUNTER — Other Ambulatory Visit (HOSPITAL_COMMUNITY): Payer: Self-pay

## 2021-05-06 ENCOUNTER — Other Ambulatory Visit: Payer: Self-pay | Admitting: Neurology

## 2021-05-06 ENCOUNTER — Other Ambulatory Visit (HOSPITAL_COMMUNITY): Payer: Self-pay

## 2021-05-06 ENCOUNTER — Other Ambulatory Visit: Payer: Self-pay | Admitting: Adult Health

## 2021-05-06 MED ORDER — PROPRANOLOL HCL 10 MG PO TABS
10.0000 mg | ORAL_TABLET | Freq: Two times a day (BID) | ORAL | 11 refills | Status: DC
Start: 2021-05-06 — End: 2022-06-28
  Filled 2021-05-06: qty 60, 30d supply, fill #0
  Filled 2021-06-07: qty 60, 30d supply, fill #1
  Filled 2021-07-13: qty 60, 30d supply, fill #2
  Filled 2021-08-09: qty 60, 30d supply, fill #3
  Filled 2021-09-10: qty 60, 30d supply, fill #4
  Filled 2021-10-26: qty 60, 30d supply, fill #5
  Filled 2021-12-03: qty 60, 30d supply, fill #6
  Filled 2022-01-01: qty 60, 30d supply, fill #7
  Filled 2022-02-09: qty 60, 30d supply, fill #8
  Filled 2022-03-08: qty 60, 30d supply, fill #9
  Filled 2022-03-30: qty 60, 30d supply, fill #10
  Filled 2022-04-21: qty 60, 30d supply, fill #11

## 2021-05-06 MED FILL — Cyclobenzaprine HCl Tab 5 MG: ORAL | 30 days supply | Qty: 30 | Fill #1 | Status: AC

## 2021-05-07 ENCOUNTER — Other Ambulatory Visit (HOSPITAL_COMMUNITY): Payer: Self-pay

## 2021-05-23 ENCOUNTER — Ambulatory Visit: Payer: No Typology Code available for payment source

## 2021-05-25 ENCOUNTER — Telehealth: Payer: No Typology Code available for payment source | Admitting: Nurse Practitioner

## 2021-05-25 DIAGNOSIS — J029 Acute pharyngitis, unspecified: Secondary | ICD-10-CM | POA: Diagnosis not present

## 2021-05-25 MED ORDER — PREDNISONE 20 MG PO TABS
40.0000 mg | ORAL_TABLET | Freq: Every day | ORAL | 0 refills | Status: AC
  Filled 2021-05-25: qty 10, 5d supply, fill #0

## 2021-05-25 NOTE — Addendum Note (Signed)
Addended by: Chevis Pretty on: 05/25/2021 07:47 PM   Modules accepted: Orders

## 2021-05-25 NOTE — Progress Notes (Signed)
  E-Visit for Sore Throat  We are sorry that you are not feeling well.  Here is how we plan to help!  Your symptoms indicate a likely viral infection (Pharyngitis).   Pharyngitis is inflammation in the back of the throat which can cause a sore throat, scratchiness and sometimes difficulty swallowing.   Pharyngitis is typically caused by a respiratory virus and will just run its course.  Please keep in mind that your symptoms could last up to 10 days.  For throat pain, we recommend over the counter oral pain relief medications such as acetaminophen or aspirin, or anti-inflammatory medications such as ibuprofen or naproxen sodium.  Topical treatments such as oral throat lozenges or sprays may be used as needed.  Avoid close contact with loved ones, especially the very young and elderly.  Remember to wash your hands thoroughly throughout the day as this is the number one way to prevent the spread of infection and wipe down door knobs and counters with disinfectant.  After careful review of your answers, I would not recommend and antibiotic for your condition.  Antibiotics should not be used to treat conditions that we suspect are caused by viruses like the virus that causes the common cold or flu. However, some people can have Strep with atypical symptoms. You may need formal testing in clinic or office to confirm if your symptoms continue or worsen.  Providers prescribe antibiotics to treat infections caused by bacteria. Antibiotics are very powerful in treating bacterial infections when they are used properly.  To maintain their effectiveness, they should be used only when necessary.  Overuse of antibiotics has resulted in the development of super bugs that are resistant to treatment!    Home Care: Only take medications as instructed by your medical team. Do not drink alcohol while taking these medications. A steam or ultrasonic humidifier can help congestion.  You can place a towel over your head and  breathe in the steam from hot water coming from a faucet. Avoid close contacts especially the very young and the elderly. Cover your mouth when you cough or sneeze. Always remember to wash your hands.  Get Help Right Away If: You develop worsening fever or throat pain. You develop a severe head ache or visual changes. Your symptoms persist after you have completed your treatment plan.  Make sure you Understand these instructions. Will watch your condition. Will get help right away if you are not doing well or get worse.   Thank you for choosing an e-visit.  Your e-visit answers were reviewed by a board certified advanced clinical practitioner to complete your personal care plan. Depending upon the condition, your plan could have included both over the counter or prescription medications.  Please review your pharmacy choice. Make sure the pharmacy is open so you can pick up prescription now. If there is a problem, you may contact your provider through MyChart messaging and have the prescription routed to another pharmacy.  Your safety is important to us. If you have drug allergies check your prescription carefully.   For the next 24 hours you can use MyChart to ask questions about today's visit, request a non-urgent call back, or ask for a work or school excuse. You will get an email in the next two days asking about your experience. I hope that your e-visit has been valuable and will speed your recovery.  5-10 minutes spent reviewing and documenting in chart.  

## 2021-05-26 ENCOUNTER — Other Ambulatory Visit (HOSPITAL_COMMUNITY): Payer: Self-pay

## 2021-05-27 ENCOUNTER — Other Ambulatory Visit (HOSPITAL_COMMUNITY): Payer: Self-pay

## 2021-05-27 MED ORDER — WEGOVY 1.7 MG/0.75ML ~~LOC~~ SOAJ
SUBCUTANEOUS | 0 refills | Status: DC
  Filled 2021-05-27: qty 3, 28d supply, fill #0

## 2021-05-28 ENCOUNTER — Other Ambulatory Visit (HOSPITAL_COMMUNITY): Payer: Self-pay

## 2021-06-08 ENCOUNTER — Other Ambulatory Visit (HOSPITAL_COMMUNITY): Payer: Self-pay

## 2021-06-09 ENCOUNTER — Other Ambulatory Visit (HOSPITAL_COMMUNITY): Payer: Self-pay

## 2021-06-09 MED ORDER — IBUPROFEN 600 MG PO TABS: ORAL_TABLET | ORAL | 2 refills | Status: DC

## 2021-06-09 MED ORDER — IBUPROFEN 600 MG PO TABS
ORAL_TABLET | ORAL | 1 refills | Status: DC
  Filled 2021-06-09: qty 90, 30d supply, fill #0
  Filled 2021-10-26: qty 90, 30d supply, fill #1

## 2021-06-23 ENCOUNTER — Other Ambulatory Visit (HOSPITAL_COMMUNITY): Payer: Self-pay

## 2021-06-26 ENCOUNTER — Other Ambulatory Visit (HOSPITAL_COMMUNITY): Payer: Self-pay

## 2021-06-26 MED ORDER — WEGOVY 1.7 MG/0.75ML ~~LOC~~ SOAJ
SUBCUTANEOUS | 0 refills | Status: DC
  Filled 2021-06-26: qty 3, 28d supply, fill #0

## 2021-07-09 ENCOUNTER — Other Ambulatory Visit: Payer: Self-pay | Admitting: Obstetrics and Gynecology

## 2021-07-09 DIAGNOSIS — N6310 Unspecified lump in the right breast, unspecified quadrant: Secondary | ICD-10-CM

## 2021-07-13 ENCOUNTER — Other Ambulatory Visit (HOSPITAL_COMMUNITY): Payer: Self-pay

## 2021-07-13 MED ORDER — ALPRAZOLAM 0.5 MG PO TABS
ORAL_TABLET | ORAL | 1 refills | Status: DC
  Filled 2021-07-13: qty 10, 30d supply, fill #0
  Filled 2021-08-09: qty 10, 30d supply, fill #1

## 2021-07-14 ENCOUNTER — Other Ambulatory Visit: Payer: No Typology Code available for payment source

## 2021-07-14 ENCOUNTER — Other Ambulatory Visit (HOSPITAL_COMMUNITY): Payer: Self-pay

## 2021-07-14 ENCOUNTER — Ambulatory Visit
Admission: RE | Admit: 2021-07-14 | Discharge: 2021-07-14 | Disposition: A | Discharge status: Home / Self Care | Payer: No Typology Code available for payment source | Source: Ambulatory Visit | Attending: Obstetrics and Gynecology | Admitting: Obstetrics and Gynecology

## 2021-07-14 ENCOUNTER — Other Ambulatory Visit: Payer: Self-pay | Admitting: Obstetrics and Gynecology

## 2021-07-14 ENCOUNTER — Other Ambulatory Visit: Payer: Self-pay

## 2021-07-14 DIAGNOSIS — N6310 Unspecified lump in the right breast, unspecified quadrant: Secondary | ICD-10-CM

## 2021-07-24 ENCOUNTER — Other Ambulatory Visit (HOSPITAL_COMMUNITY): Payer: Self-pay

## 2021-07-24 MED ORDER — WEGOVY 1.7 MG/0.75ML ~~LOC~~ SOAJ
SUBCUTANEOUS | 0 refills | Status: DC
  Filled 2021-07-24: qty 3, 28d supply, fill #0

## 2021-08-05 ENCOUNTER — Other Ambulatory Visit (HOSPITAL_COMMUNITY): Payer: Self-pay

## 2021-08-05 MED ORDER — SUMATRIPTAN SUCCINATE 100 MG PO TABS
ORAL_TABLET | ORAL | 11 refills | Status: DC
  Filled 2021-08-05: qty 9, 30d supply, fill #0

## 2021-08-09 ENCOUNTER — Other Ambulatory Visit (HOSPITAL_COMMUNITY): Payer: Self-pay

## 2021-08-10 ENCOUNTER — Other Ambulatory Visit (HOSPITAL_COMMUNITY): Payer: Self-pay

## 2021-08-21 ENCOUNTER — Other Ambulatory Visit (HOSPITAL_COMMUNITY): Payer: Self-pay

## 2021-08-24 ENCOUNTER — Other Ambulatory Visit (HOSPITAL_COMMUNITY): Payer: Self-pay

## 2021-08-24 MED ORDER — WEGOVY 1.7 MG/0.75ML ~~LOC~~ SOAJ
SUBCUTANEOUS | 0 refills | Status: DC
  Filled 2021-08-24: qty 3, 28d supply, fill #0

## 2021-09-02 IMAGING — MR MR HEAD WO/W CM
16 series · 48 of 48 positions shown · IV contrast (gadavist)
Comparison: None.

CLINICAL DATA: Chronic headache

EXAM:
MRI HEAD WITHOUT AND WITH CONTRAST
TECHNIQUE: Multiplanar, multiecho pulse sequences of the brain and surrounding
structures were obtained without and with intravenous contrast.
CONTRAST:  9mL GADAVIST GADOBUTROL 1 MMOL/ML IV SOLN

[Series 5: ax dwi_tracew · axial · 3.0mm · 0.60mm/px · z∈[-58,+95]mm · 2 of 52 slices shown]
[im 1/52]
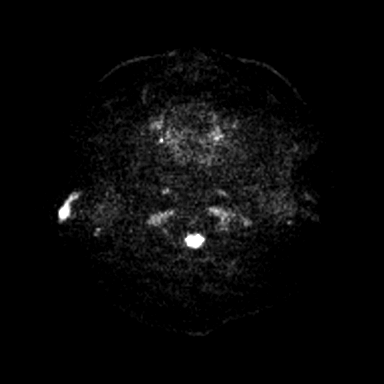
[im 52/52]
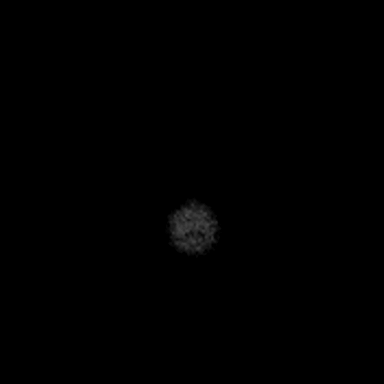

[Series 6: ax dwi_adc · axial · 3.0mm · 0.60mm/px · z∈[-58,+92]mm · 2 of 49 slices shown]
[im 1/49]
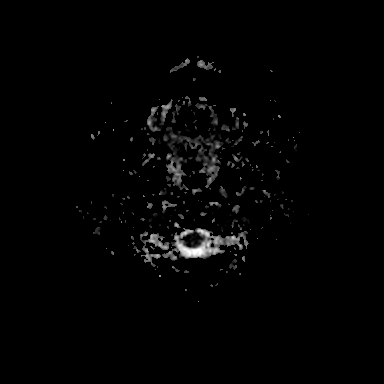
[im 49/49]
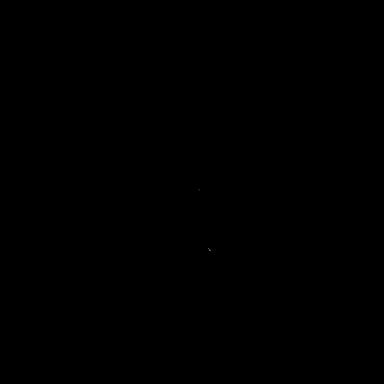

[Series 7: cor dwi_tracew · coronal · 5.0mm · 0.60mm/px · 2 of 38 slices shown]
[im 1/38]
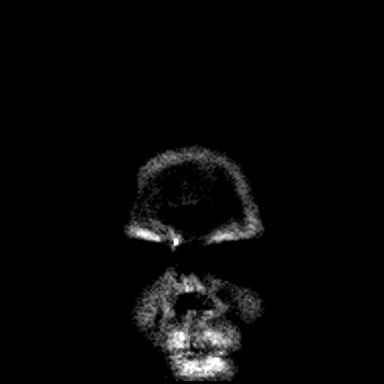
[im 38/38]
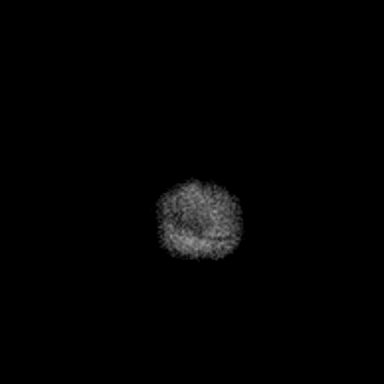

[Series 8: cor dwi_adc · coronal · 5.0mm · 0.60mm/px · 2 of 36 slices shown]
[im 1/36]
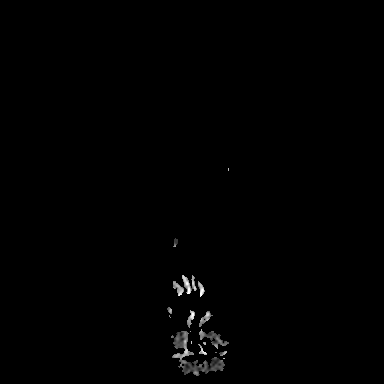
[im 36/36]
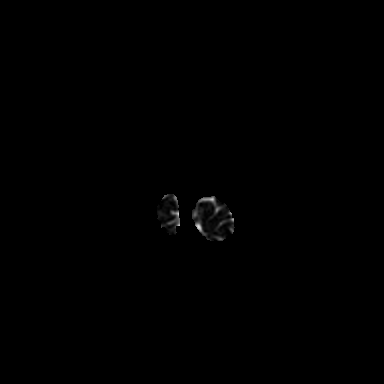

[Series 9: T1 · sagittal · 5.0mm · 0.62mm/px · 1 of 23 slices shown (1 of 2)]
[im 1/23]
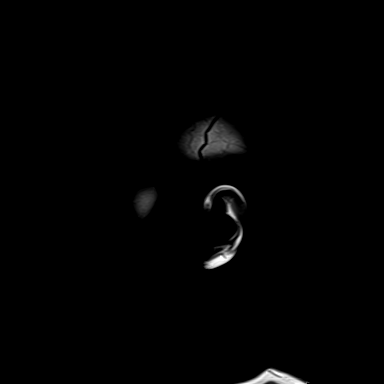

[Series 10: T2 · axial · 5.0mm · 0.53mm/px · 1 of 27 slices shown]
[im 1/27]
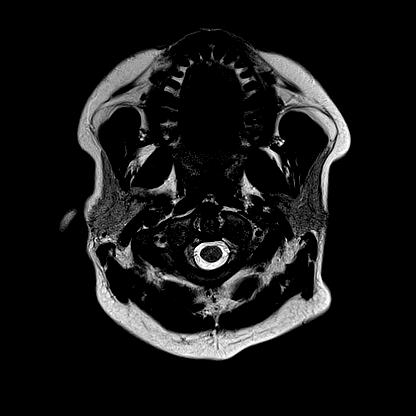

[Series 11: mag_images · axial · 3.0mm · 0.90mm/px · z∈[-72,+105]mm · 3 of 60 slices shown]
[im 1/60]
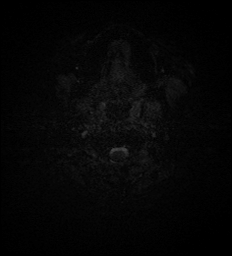
[im 30/60]
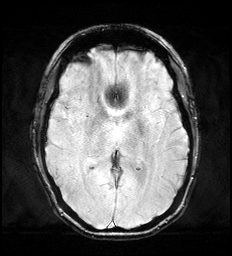
[im 60/60]
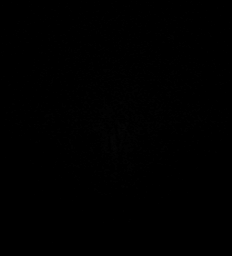

[Series 12: pha_images · axial · 3.0mm · 0.90mm/px · z∈[-72,+105]mm · 3 of 60 slices shown]
[im 1/60]
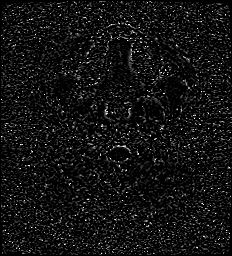
[im 30/60]
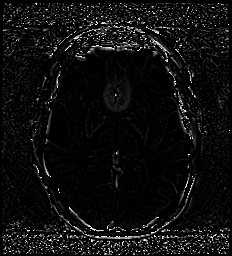
[im 60/60]
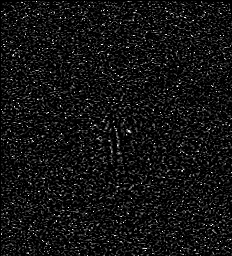

[Series 13: swi_images · axial · 3.0mm · 0.90mm/px · z∈[-72,+105]mm · 3 of 60 slices shown]
[im 1/60]
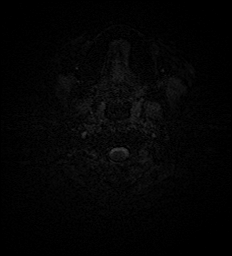
[im 30/60]
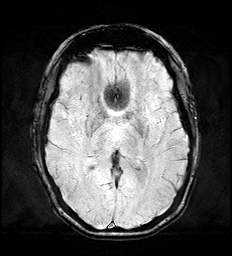
[im 60/60]
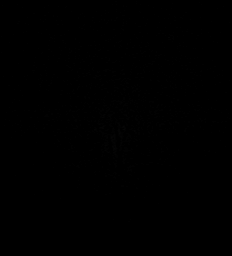

[Series 14: mip_images(sw) · axial · 24.0mm · 0.90mm/px · z∈[-61,+94]mm · 3 of 53 slices shown]
[im 1/53]
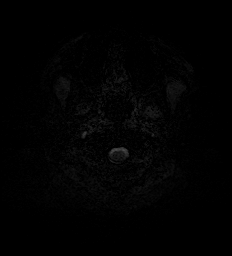
[im 27/53]
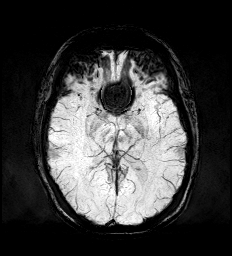
[im 53/53]
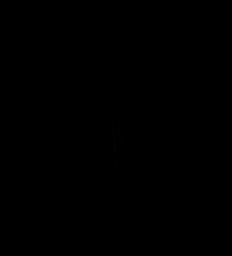

[Series 15: FLAIR · axial · 3.0mm · 0.53mm/px · z∈[-64,+97]mm · 3 of 55 slices shown]
[im 1/55]
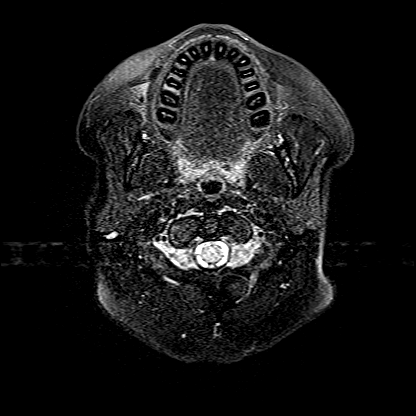
[im 28/55]
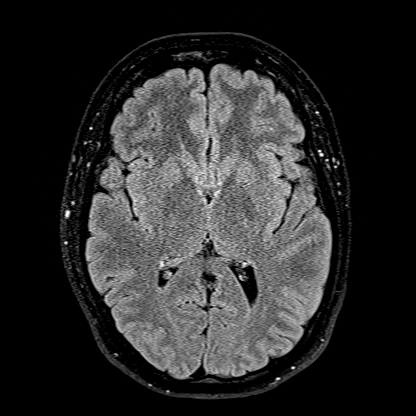
[im 55/55]
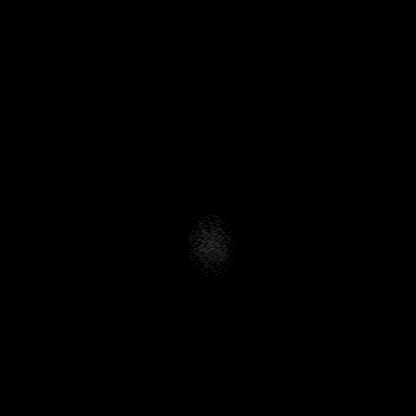

[Series 16: T1 · axial · 1.0mm · 0.98mm/px · z∈[-68,+104]mm · 9 of 172 slices shown (2 of 2)]
[im 1/172]
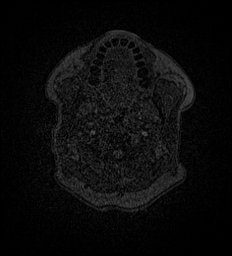
[im 22/172]
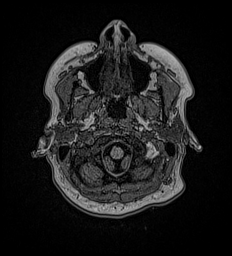
[im 43/172]
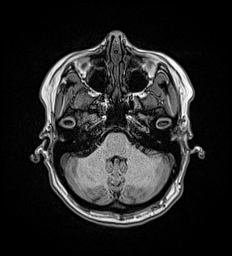
[im 65/172]
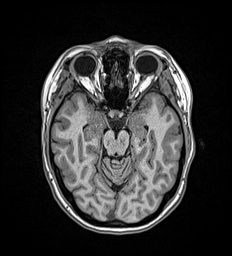
[im 86/172]
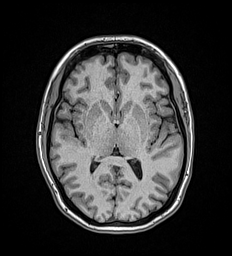
[im 107/172]
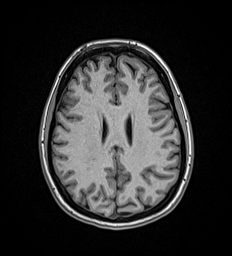
[im 129/172]
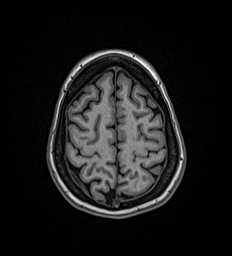
[im 150/172]
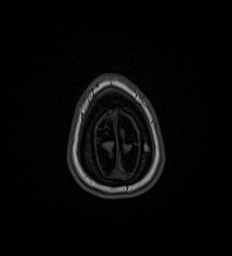
[im 172/172]
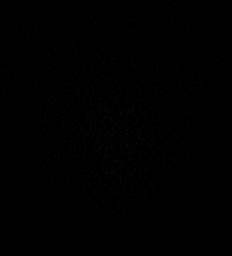

[Series 17: T2 post-contrast · coronal · 5.0mm · 0.53mm/px · 2 of 31 slices shown]
[im 1/31]
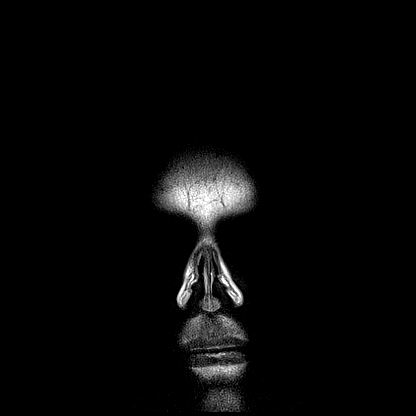
[im 31/31]
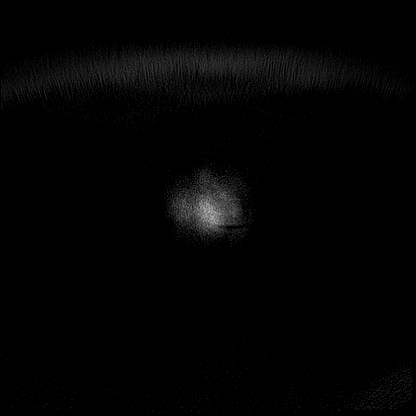

[Series 18: T1 post-contrast · axial · 1.0mm · 0.98mm/px · z∈[-68,+105]mm · 9 of 174 slices shown (1 of 3)]
[im 1/174]
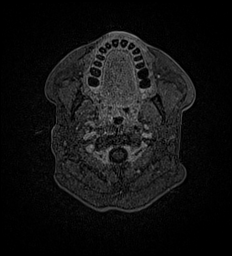
[im 22/174]
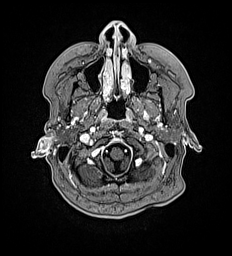
[im 44/174]
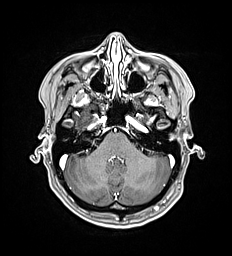
[im 65/174]
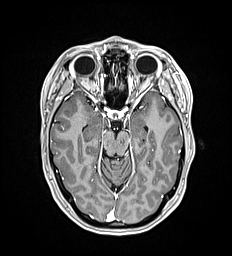
[im 87/174]
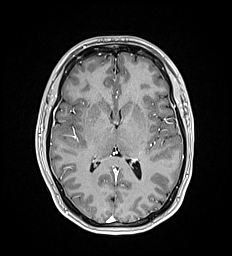
[im 109/174]
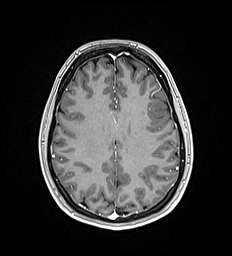
[im 130/174]
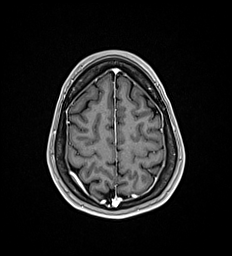
[im 152/174]
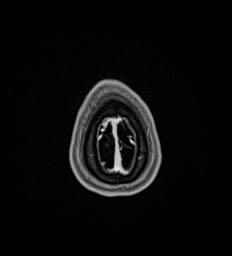
[im 174/174]
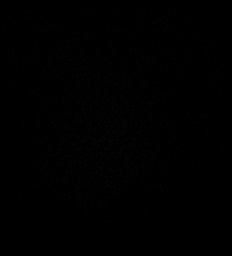

[Series 19: T1 post-contrast · coronal · 5.0mm · 0.57mm/px · 2 of 31 slices shown (2 of 3)]
[im 1/31]
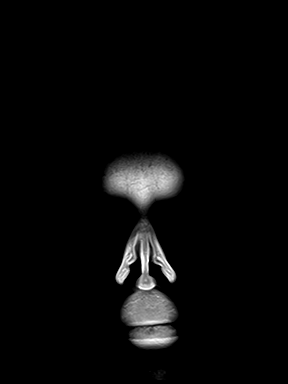
[im 31/31]
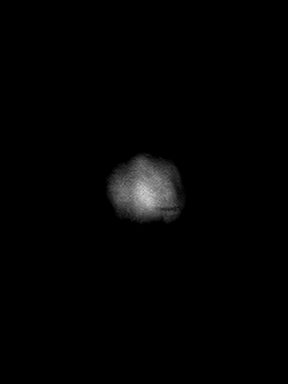

[Series 20: T1 post-contrast · sagittal · 5.0mm · 0.62mm/px · 1 of 23 slices shown (3 of 3)]
[im 1/23]
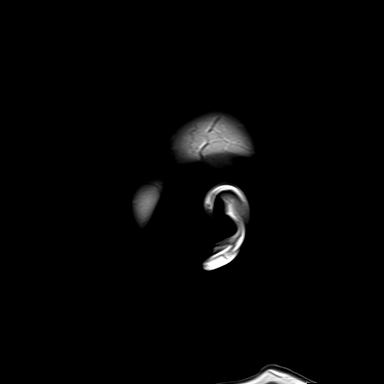

[48 of 48 positions shown; findings below may reference images not displayed]

FINDINGS: BRAIN: No acute infarct, acute hemorrhage or extra-axial collection.
Normal white matter signal. Normal volume of CSF spaces. No chronic
microhemorrhage. Normal midline structures.

VASCULAR: Major flow voids are preserved.

SKULL AND UPPER CERVICAL SPINE: Normal calvarium and skull base.
Visualized upper cervical spine and soft tissues are normal.

SINUSES/ORBITS: No paranasal sinus fluid levels or advanced mucosal
thickening. No mastoid or middle ear effusion. Normal orbits.
IMPRESSION: Normal brain MRI.

## 2021-09-10 ENCOUNTER — Other Ambulatory Visit (HOSPITAL_COMMUNITY): Payer: Self-pay

## 2021-09-10 ENCOUNTER — Other Ambulatory Visit: Payer: Self-pay | Admitting: Neurology

## 2021-09-10 MED ORDER — ALPRAZOLAM 0.5 MG PO TABS
ORAL_TABLET | ORAL | 1 refills | Status: DC
  Filled 2021-09-10: qty 10, 30d supply, fill #0
  Filled 2022-01-20: qty 10, 30d supply, fill #1

## 2021-09-10 MED ORDER — CYCLOBENZAPRINE HCL 5 MG PO TABS
ORAL_TABLET | Freq: Every day | ORAL | 0 refills | Status: DC
Start: 2021-09-10 — End: 2022-02-09
  Filled 2021-09-10: qty 30, 30d supply, fill #0

## 2021-09-22 ENCOUNTER — Other Ambulatory Visit (HOSPITAL_COMMUNITY): Payer: Self-pay

## 2021-09-23 ENCOUNTER — Other Ambulatory Visit (HOSPITAL_COMMUNITY): Payer: Self-pay

## 2021-09-23 MED ORDER — WEGOVY 1.7 MG/0.75ML ~~LOC~~ SOAJ
SUBCUTANEOUS | 0 refills | Status: DC
  Filled 2021-09-23: qty 3, 30d supply, fill #0

## 2021-09-24 ENCOUNTER — Other Ambulatory Visit (HOSPITAL_COMMUNITY): Payer: Self-pay

## 2021-10-26 ENCOUNTER — Other Ambulatory Visit (HOSPITAL_COMMUNITY): Payer: Self-pay

## 2021-10-26 MED ORDER — WEGOVY 1.7 MG/0.75ML ~~LOC~~ SOAJ
SUBCUTANEOUS | 0 refills | Status: DC
  Filled 2021-10-26: qty 3, 28d supply, fill #0

## 2021-11-03 ENCOUNTER — Telehealth: Payer: Self-pay | Admitting: Internal Medicine

## 2021-11-03 NOTE — Telephone Encounter (Signed)
Pt states that she has been having issues with Hemorrhoids for a while: Pt states that the cream that was prescribe to her is not working. Pt is  Requesting something to be prescribed stronger.   Pt was notified that its been a year since she has been in our office and was encouraged to set up an Office Visit: Pt was very reluctant to schedule an office visit. Pt was informed that with an office Visit a provider can do a thorough assessment and off of that assessment they can advise.  Please advise

## 2021-11-03 NOTE — Telephone Encounter (Signed)
Inbound call from patient stating the creams that were prescribed are not really helping and is wanting to know if something stronger can be prescribed.  Please advise.

## 2021-11-04 NOTE — Telephone Encounter (Signed)
In person assessment is required to help this patient.

## 2021-11-04 NOTE — Telephone Encounter (Signed)
Pt was made aware of Dr. Carlean Purl recommendations:  Pt scheduled of an office visit on 12/03/2021 at 2:30: Pt verbalized understanding with all questions answered.

## 2021-11-25 ENCOUNTER — Other Ambulatory Visit (HOSPITAL_COMMUNITY): Payer: Self-pay

## 2021-11-26 ENCOUNTER — Other Ambulatory Visit (HOSPITAL_COMMUNITY): Payer: Self-pay

## 2021-11-26 MED ORDER — WEGOVY 1.7 MG/0.75ML ~~LOC~~ SOAJ
SUBCUTANEOUS | 0 refills | Status: DC
  Filled 2021-11-26: qty 3, 28d supply, fill #0

## 2021-12-03 ENCOUNTER — Encounter: Payer: Self-pay | Admitting: Internal Medicine

## 2021-12-03 ENCOUNTER — Ambulatory Visit: Payer: No Typology Code available for payment source | Admitting: Internal Medicine

## 2021-12-03 ENCOUNTER — Other Ambulatory Visit (HOSPITAL_COMMUNITY): Payer: Self-pay

## 2021-12-03 VITALS — BP 100/76 | HR 96 | Ht 65.0 in | Wt 167.0 lb

## 2021-12-03 DIAGNOSIS — K644 Residual hemorrhoidal skin tags: Secondary | ICD-10-CM | POA: Diagnosis not present

## 2021-12-03 NOTE — Progress Notes (Signed)
? ?[name] y.o. September 13, 1986 [id number] ? ?Assessment & Plan:  ? ?Encounter Diagnosis  ?Name Primary?  ? External hemorrhoids with complication Yes  ? ? ? ?Would like hemorrhoid removed but I do not think any surgery would be offered at this time based on current status. ? ?I have suggested she contact me if it flares and prolapses again at least I can take a look at it and perhaps photo documented for future reference and potential surgical evaluation.  I would try to work her in and not make her wait weeks to be seen. ? ? ?If she needs to see another surgeon she would prefer different physician (not Dr. Marcello Moores) ? ?Subjective:  ? ?Chief Complaint: Hemorrhoids ? ?HPI ?[name] is a 36 year old white woman with a history of a chronic posterior anal fissure last seen in February 2022.  At that time she had had a lot of diarrhea related to [place] and she had a irritated and inflamed prolapsed external hemorrhoid as well.  Her fissure does not bother her anymore but she called or message a month or so ago was having a flareup of hemorrhoids again and is hoping for resolution.  She used some topical prescription medications that she had in the past and her hemorrhoid has calmed down at this point. ? ?She had previously been referred to [facility] surgery through gynecology or primary care and had a bad interaction with Dr. Marcello Moores as she was recommended to go for follow-up and Dr. Marcello Moores did not think she needed it and the patient feels like she was "caught in the middle".  She prefers not to see Dr. Marcello Moores again if needed. ? ?She has been losing weight on regularly and with diet changes.  She says she is lost about 100 pounds since 2018 ? ?Wt Readings from Last 3 Encounters:  ?12/03/21 167 lb (75.8 kg)  ?03/17/21 184 lb (83.5 kg)  ?10/14/20 204 lb 4.8 oz (92.7 kg)  ? ? ?Allergies  ?Allergen Reactions  ? Macrobid [employer] Macro] Other (See Comments)  ?  FLU LIKE SYMPTONS  ? Penicillins Hives   ?  Has patient had a PCN reaction causing immediate rash, facial/tongue/throat swelling, SOB or lightheadedness with hypotension: yes ?Has patient had a PCN reaction causing severe rash involving mucus membranes or skin necrosis: no ?Has patient had a PCN reaction that required hospitalization no ?Has patient had a PCN reaction occurring within the last 10 years: yes ?If all of the above answers are "NO", then may proceed with Cephalosporin use. ?  ? ?Current Meds  ?Medication Sig  ? ALPRAZolam (XANAX) 0.5 MG tablet Take 0.5 mg by mouth daily as needed.  ? ALPRAZolam (XANAX) 0.5 MG tablet Take 1 tablet by mouth once a day if needed 30 days  ? AMBULATORY NON FORMULARY MEDICATION Diltiazem 2% gel mixed with Lidocaine 5% ?Sig : apply a pea size amount to rectum three times daily  ? cyclobenzaprine (FLEXERIL) 5 MG tablet TAKE 1 TABLET BY MOUTH AT BEDTIME  ? diphenhydrAMINE (BENADRYL) 25 MG tablet Take 25 mg by mouth at bedtime as needed for allergies or sleep.  ? fluconazole (DIFLUCAN) 150 MG tablet Take 1 tablet by mouth on days 1, 4, and 7  ? frovatriptan (FROVA) 2.5 MG tablet Take 2.5 mg by mouth as needed.  ? ibuprofen (ADVIL) 600 MG tablet Take 600 mg by mouth 3 (three) times daily as needed.  ? ibuprofen (ADVIL) 600 MG tablet TAKE 1 TABLET BY MOUTH  3 TIMES DAILY AS NEEDED WITH FOOD OR MILK  ? ibuprofen (ADVIL) 600 MG tablet TAKE 1 TABLET BY MOUTH THREE TIMES DAILY WITH FOOD OR MILK AS NEEDED.  ? pramoxine-hydrocortisone (PROCTOCREAM-HC) 1-1 % rectal cream Place 1 application rectally at bedtime.  ? propranolol (INDERAL) 10 MG tablet Take 1 tablet (10 mg total) by mouth 2 (two) times daily.  ? propranolol (INDERAL) 10 MG tablet Take 1 tablet (10 mg total) by mouth 2 (two) times daily.  ? Semaglutide-Weight Management (WEGOVY [place]) Inject into the skin.  ? Semaglutide-Weight Management (WEGOVY) 1.7 MG/0.75ML SOAJ Inject 1.7 mg into the skin once a week as directed (Per MD call office is need to increase to 2.'4mg'$ )  ?  SUMAtriptan (IMITREX) 100 MG tablet Take 1 tablet by mouth once a day at least 2 hours between doses as needed  ? triamcinolone (KENALOG) 5.397 % cream 1 application to affected area  ? Wheat Dextrin (BENEFIBER) POWD 1-2 tablespoons nightly-twice a day  ? ?Past Medical History:  ?Diagnosis Date  ? Anxiety   ? Asthma   ? CHILDHOOD  ? Bacterial vaginitis   ? Chronic anal fissure   ? Chronic kidney disease   ? INTERTERSIAL CYSTITIS  ? Complication of anesthesia   ? has concerns from anesthesia from  first  CS, breathing problems, 2nd csection legs numb  ? Contact dermatitis due to adhesives 07/06/2017  ? COVID-19   ? Frequency of urination   ? GERD (gastroesophageal reflux disease)   ? Headache   ? MIGRAINES   ? Heart murmur   ? mild no cardiologist  ? History of pre-eclampsia   ? Hypothyroidism   ? Interstitial cystitis   ? LGSIL on Pap smear of cervix 2010  ? Nocturia   ? Pelvic pain in female   ? PONV (postoperative nausea and vomiting)   ? Spotting between menses   ? Urgency of urination   ? ?Past Surgical History:  ?Procedure Laterality Date  ? CESAREAN SECTION N/A 07/05/2017  ? Procedure: CESAREAN SECTION;  Surgeon: Brien Few, MD;  Location: [facility];  Service: Obstetrics;  Laterality: N/A;  ? CESAREAN SECTION WITH BILATERAL TUBAL LIGATION N/A 11/21/2019  ? Procedure: CESAREAN SECTION WITH BILATERAL TUBAL LIGATION;  Surgeon: Brien Few, MD;  Location: [facility];  Service: Obstetrics;  Laterality: N/A;  ? CHROMOPERTUBATION Bilateral 07/23/2016  ? Procedure: CHROMOPERTUBATION;  Surgeon: Brien Few, MD;  Location: [facility];  Service: Gynecology;  Laterality: Bilateral;  ? COLPOSCOPY  2010  ? CYSTO WITH HYDRODISTENSION  10/14/2011  ? Procedure: CYSTOSCOPY/HYDRODISTENSION;  Surgeon: Malka So, MD;  Location: [facility];  Service: Urology;  Laterality: N/A;  M & P ?  ? DILATION AND CURETTAGE OF UTERUS  09/16/2011  ? Procedure: DILATATION AND CURETTAGE;  Surgeon: Gus Height;   Location: [facility];  Service: Gynecology;  Laterality: N/A;  Endometrial Biopsy, Urethral Dilation  ? DILATION AND EVACUATION N/A 02/26/2016  ? Procedure: DILATATION AND EVACUATION/Chromosome Analysis;  Surgeon: Brien Few, MD;  Location: [facility];  Service: Gynecology;  Laterality: N/A;  with chromosome studies   ? DILITATION & CURRETTAGE/HYSTROSCOPY WITH NOVASURE ABLATION N/A 07/03/2020  ? Procedure: DILATATION & CURETTAGE/HYSTEROSCOPY WITH NOVASURE ABLATION;  Surgeon: Brien Few, MD;  [facility];  Service: Gynecology;  Laterality: N/A;  ? LAPAROSCOPIC LYSIS OF ADHESIONS    ? LAPAROSCOPY  09/16/2011  ? Procedure: LAPAROSCOPY OPERATIVE;  Surgeon: Gus Height;  Location: [facility];  Service: Gynecology;  Laterality: N/A;  ?  LAPAROSCOPY N/A 07/23/2016  ? Procedure: LAPAROSCOPY OPERATIVE, LYSIS OF ADHESIONS;  Surgeon: Brien Few, MD;  Location: [facility];  Service: Gynecology;  Laterality: N/A;  ? NOSE SURGERY  2015  ? turbinate reduction  ? SEPTOPLASTY N/A 12/24/2019  ? Procedure: SEPTOPLASTY;  Surgeon: [name], MD;  Location: [facility];  Service: ENT;  Laterality: N/A;  ? WISDOM TOOTH EXTRACTION  2008- ORAL SURG OFFICE  ? ?Social History  ? ?Social History Narrative  ? Married 1 son 1 daughter born 2018 in 2021  ? 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 No alcohol 1 caffeinated beverage daily former smoker no drug use no tobacco now  ? ?family history includes Colon cancer in her paternal grandfather; Esophageal cancer in her maternal uncle; Healthy in her father and mother; Hypertension in her sister. ? ? ?Review of Systems ?100# weight loss Wegovy ? ?Objective:  ? Physical Exam ?BP 100/76   Pulse 96   Ht '5\' 5"'$  (1.651 m)   Wt 167 lb (75.8 kg)   SpO2 98%   BMI 27.79 kg/m?  ? ?[name], CMA present ? ?Rectal - sl erythema in gluteal crease, otherwise NL anoderm ?DRE normal and nontender, no mass, rectocele ? ?Anoscopy shows a Gr 1 sl inflamed external  hemorrhoid RP, other columns w/o abnromality ?

## 2021-12-03 NOTE — Patient Instructions (Signed)
?  Please call us if you have another hemorrhoid flare so we can try and get you in quickly to exam you . ? ? ?If you are age 34 or older, your body mass index should be between 23-30. Your Body mass index is 27.79 kg/m?Marland Kitchen If this is out of the aforementioned range listed, please consider follow up with your Primary Care Provider. ? ?If you are age 75 or younger, your body mass index should be between 19-25. Your Body mass index is 27.79 kg/m?Marland Kitchen If this is out of the aformentioned range listed, please consider follow up with your Primary Care Provider.  ? ?________________________________________________________ ? ?The [facility] providers would like to encourage you to use [facility] to communicate with providers for non-urgent requests or questions.  Due to long hold times on the telephone, sending your provider a message by [facility] may be a faster and more efficient way to get a response.  Please allow 48 business hours for a response.  Please remember that this is for non-urgent requests.  ?_______________________________________________________ ? ? ?I appreciate the opportunity to care for you. ?Silvano Rusk, MD, [facility] ?

## 2021-12-09 ENCOUNTER — Other Ambulatory Visit (HOSPITAL_COMMUNITY): Payer: Self-pay

## 2021-12-09 ENCOUNTER — Encounter: Payer: Self-pay | Admitting: Internal Medicine

## 2021-12-09 MED ORDER — WEGOVY 2.4 MG/0.75ML ~~LOC~~ SOAJ
SUBCUTANEOUS | 0 refills | Status: DC
Start: 2021-12-09 — End: 2022-01-13
  Filled 2021-12-09 – 2021-12-21 (×2): qty 3, 28d supply, fill #0

## 2021-12-10 ENCOUNTER — Other Ambulatory Visit (HOSPITAL_COMMUNITY): Payer: Self-pay

## 2021-12-10 ENCOUNTER — Other Ambulatory Visit: Payer: Self-pay | Admitting: Internal Medicine

## 2021-12-10 MED ORDER — HYDROCORTISONE (PERIANAL) 2.5 % EX CREA
1.0000 "application " | TOPICAL_CREAM | Freq: Two times a day (BID) | CUTANEOUS | 0 refills | Status: DC
  Filled 2021-12-10: qty 30, 10d supply, fill #0

## 2021-12-10 NOTE — Telephone Encounter (Signed)
Pt notified of Dr. Carlean Purl recommendation to see her in the office  this afternoon for evaluation: Pt stated that she will not be able to come to the appointment due to childcare issues and unable to do another [employer]:  ? ?

## 2021-12-11 NOTE — Telephone Encounter (Signed)
Pt made aware of Dr. Carlean Purl advise and recommendations  to contact [facility] : Phone number provided to pt: [phone] ?

## 2021-12-11 NOTE — Telephone Encounter (Signed)
Patient called in and states she does believe she now need to come in for an appointment as she is in so much pain. Best contact number [phone] ?

## 2021-12-15 ENCOUNTER — Other Ambulatory Visit (HOSPITAL_COMMUNITY): Payer: Self-pay

## 2021-12-16 ENCOUNTER — Other Ambulatory Visit (HOSPITAL_COMMUNITY): Payer: Self-pay

## 2021-12-17 ENCOUNTER — Other Ambulatory Visit (HOSPITAL_COMMUNITY): Payer: Self-pay

## 2021-12-17 MED ORDER — METRONIDAZOLE 500 MG PO TABS
ORAL_TABLET | ORAL | 0 refills | Status: DC
  Filled 2021-12-17: qty 14, 7d supply, fill #0

## 2021-12-18 ENCOUNTER — Other Ambulatory Visit (HOSPITAL_COMMUNITY): Payer: Self-pay

## 2021-12-21 ENCOUNTER — Other Ambulatory Visit (HOSPITAL_COMMUNITY): Payer: Self-pay

## 2021-12-23 ENCOUNTER — Ambulatory Visit: Payer: No Typology Code available for payment source | Admitting: Internal Medicine

## 2021-12-24 ENCOUNTER — Other Ambulatory Visit (HOSPITAL_COMMUNITY): Payer: Self-pay

## 2021-12-24 MED ORDER — SULFAMETHOXAZOLE-TRIMETHOPRIM 800-160 MG PO TABS
ORAL_TABLET | ORAL | 0 refills | Status: DC
  Filled 2021-12-24: qty 6, 3d supply, fill #0

## 2022-01-01 ENCOUNTER — Other Ambulatory Visit (HOSPITAL_COMMUNITY): Payer: Self-pay

## 2022-01-01 MED ORDER — IBUPROFEN 600 MG PO TABS
ORAL_TABLET | ORAL | 1 refills | Status: DC
  Filled 2022-01-01: qty 90, 30d supply, fill #0
  Filled 2022-02-09: qty 90, 30d supply, fill #1

## 2022-01-02 ENCOUNTER — Other Ambulatory Visit: Payer: Self-pay | Admitting: Obstetrics and Gynecology

## 2022-01-02 DIAGNOSIS — N6320 Unspecified lump in the left breast, unspecified quadrant: Secondary | ICD-10-CM

## 2022-01-02 DIAGNOSIS — N6001 Solitary cyst of right breast: Secondary | ICD-10-CM

## 2022-01-08 ENCOUNTER — Encounter: Payer: Self-pay | Admitting: Internal Medicine

## 2022-01-12 ENCOUNTER — Other Ambulatory Visit (HOSPITAL_COMMUNITY): Payer: Self-pay

## 2022-01-12 ENCOUNTER — Ambulatory Visit
Admission: RE | Admit: 2022-01-12 | Discharge: 2022-01-12 | Disposition: A | Discharge status: Home / Self Care | Payer: No Typology Code available for payment source | Source: Ambulatory Visit | Attending: Obstetrics and Gynecology | Admitting: Obstetrics and Gynecology

## 2022-01-12 DIAGNOSIS — N6310 Unspecified lump in the right breast, unspecified quadrant: Secondary | ICD-10-CM

## 2022-01-13 ENCOUNTER — Other Ambulatory Visit (HOSPITAL_COMMUNITY): Payer: Self-pay

## 2022-01-13 MED ORDER — WEGOVY 2.4 MG/0.75ML ~~LOC~~ SOAJ
SUBCUTANEOUS | 0 refills | Status: DC
  Filled 2022-01-13: qty 3, 28d supply, fill #0

## 2022-01-21 ENCOUNTER — Other Ambulatory Visit (HOSPITAL_COMMUNITY): Payer: Self-pay

## 2022-01-25 ENCOUNTER — Other Ambulatory Visit: Payer: Self-pay

## 2022-02-01 ENCOUNTER — Other Ambulatory Visit (HOSPITAL_COMMUNITY): Payer: Self-pay

## 2022-02-01 MED ORDER — WEGOVY 1.7 MG/0.75ML ~~LOC~~ SOAJ
SUBCUTANEOUS | 0 refills | Status: DC
  Filled 2022-02-01: qty 3, 28d supply, fill #0
  Filled 2022-02-01: qty 2.25, 28d supply, fill #0

## 2022-02-09 ENCOUNTER — Other Ambulatory Visit: Payer: Self-pay | Admitting: Adult Health

## 2022-02-09 ENCOUNTER — Other Ambulatory Visit (HOSPITAL_COMMUNITY): Payer: Self-pay

## 2022-02-10 ENCOUNTER — Other Ambulatory Visit (HOSPITAL_COMMUNITY): Payer: Self-pay

## 2022-02-10 MED ORDER — CYCLOBENZAPRINE HCL 5 MG PO TABS
5.0000 mg | ORAL_TABLET | Freq: Every evening | ORAL | 0 refills | Status: DC | PRN
  Filled 2022-02-10: qty 30, 30d supply, fill #0

## 2022-02-13 ENCOUNTER — Telehealth: Payer: No Typology Code available for payment source | Admitting: Physician Assistant

## 2022-02-13 ENCOUNTER — Other Ambulatory Visit (HOSPITAL_COMMUNITY): Payer: Self-pay

## 2022-02-13 DIAGNOSIS — H1031 Unspecified acute conjunctivitis, right eye: Secondary | ICD-10-CM

## 2022-02-13 MED ORDER — POLYMYXIN B-TRIMETHOPRIM 10000-0.1 UNIT/ML-% OP SOLN
1.0000 [drp] | OPHTHALMIC | 0 refills | Status: DC
  Filled 2022-02-13: qty 10, 34d supply, fill #0

## 2022-02-13 NOTE — Progress Notes (Signed)
E-Visit for [employer]   We are sorry that you are not feeling well.  Here is how we plan to help!  Based on what you have shared with me it looks like you have conjunctivitis.  Conjunctivitis is a common inflammatory or infectious condition of the eye that is often referred to as "pink eye".  In most cases it is contagious (viral or bacterial). However, not all conjunctivitis requires antibiotics (ex. Allergic).  We have made appropriate suggestions for you based upon your presentation.  I have prescribed Polytrim Ophthalmic drops 1-2 drops 4 times a day times 5 days  Pink eye can be highly contagious.  It is typically spread through direct contact with secretions, or contaminated objects or surfaces that one may have touched.  Strict handwashing is suggested with soap and water is urged.  If not available, use alcohol based had sanitizer.  Avoid unnecessary touching of the eye.  If you wear contact lenses, you will need to refrain from wearing them until you see no white discharge from the eye for at least 24 hours after being on medication.  You should see symptom improvement in 1-2 days after starting the medication regimen.  Call us if symptoms are not improved in 1-2 days.  Home Care: Wash your hands often! Do not wear your contacts until you complete your treatment plan. Avoid sharing towels, bed linen, personal items with a person who has pink eye. See attention for anyone in your home with similar symptoms.  Get Help Right Away If: Your symptoms do not improve. You develop blurred or loss of vision. Your symptoms worsen (increased discharge, pain or redness)   Thank you for choosing an e-visit.  Your e-visit answers were reviewed by a board certified advanced clinical practitioner to complete your personal care plan. Depending upon the condition, your plan could have included both over the counter or prescription medications.  Please review your pharmacy choice. Make sure the  pharmacy is open so you can pick up prescription now. If there is a problem, you may contact your provider through [employer] and have the prescription routed to another pharmacy.  Your safety is important to [place]. If you have drug allergies check your prescription carefully.   For the next 24 hours you can use MyChart to ask questions about today's visit, request a non-urgent call back, or ask for a work or school excuse. You will get an email in the next two days asking about your experience. I hope that your e-visit has been valuable and will speed your recovery.  Time spent with patient today was 5-10 minutes which consisted of chart review, discussing diagnosis, work up, treatment.  Inda Coke PA-C

## 2022-02-26 ENCOUNTER — Telehealth: Payer: No Typology Code available for payment source | Admitting: Physician Assistant

## 2022-02-26 ENCOUNTER — Other Ambulatory Visit (HOSPITAL_COMMUNITY): Payer: Self-pay

## 2022-02-26 DIAGNOSIS — H60332 Swimmer's ear, left ear: Secondary | ICD-10-CM

## 2022-02-26 MED ORDER — NEOMYCIN-POLYMYXIN-HC 3.5-10000-1 OT SOLN
3.0000 [drp] | Freq: Four times a day (QID) | OTIC | 0 refills | Status: DC
  Filled 2022-02-26: qty 10, 17d supply, fill #0

## 2022-02-26 NOTE — Progress Notes (Signed)

## 2022-03-04 ENCOUNTER — Other Ambulatory Visit (HOSPITAL_COMMUNITY): Payer: Self-pay

## 2022-03-05 ENCOUNTER — Other Ambulatory Visit (HOSPITAL_COMMUNITY): Payer: Self-pay

## 2022-03-05 MED ORDER — WEGOVY 1.7 MG/0.75ML ~~LOC~~ SOAJ
SUBCUTANEOUS | 0 refills | Status: DC
  Filled 2022-03-05: qty 3, 28d supply, fill #0

## 2022-03-08 ENCOUNTER — Other Ambulatory Visit (HOSPITAL_COMMUNITY): Payer: Self-pay

## 2022-03-08 MED ORDER — IBUPROFEN 600 MG PO TABS
ORAL_TABLET | ORAL | 1 refills | Status: DC
  Filled 2022-03-08: qty 90, 30d supply, fill #0
  Filled 2022-03-30: qty 90, 30d supply, fill #1

## 2022-03-09 ENCOUNTER — Encounter (HOSPITAL_COMMUNITY): Payer: Self-pay | Admitting: Pharmacist

## 2022-03-09 ENCOUNTER — Other Ambulatory Visit (HOSPITAL_COMMUNITY): Payer: Self-pay

## 2022-03-30 ENCOUNTER — Other Ambulatory Visit (HOSPITAL_COMMUNITY): Payer: Self-pay

## 2022-03-31 ENCOUNTER — Other Ambulatory Visit (HOSPITAL_COMMUNITY): Payer: Self-pay

## 2022-04-01 ENCOUNTER — Other Ambulatory Visit (HOSPITAL_COMMUNITY): Payer: Self-pay

## 2022-04-01 MED ORDER — WEGOVY 1.7 MG/0.75ML ~~LOC~~ SOAJ
SUBCUTANEOUS | 0 refills | Status: DC
  Filled 2022-04-01: qty 3, 28d supply, fill #0

## 2022-04-21 ENCOUNTER — Telehealth: Payer: Self-pay | Admitting: Adult Health

## 2022-04-21 ENCOUNTER — Other Ambulatory Visit (HOSPITAL_COMMUNITY): Payer: Self-pay

## 2022-04-21 ENCOUNTER — Encounter (INDEPENDENT_AMBULATORY_CARE_PROVIDER_SITE_OTHER): Payer: Self-pay

## 2022-04-21 ENCOUNTER — Encounter: Payer: Self-pay | Admitting: Adult Health

## 2022-04-21 MED ORDER — WEGOVY 1.7 MG/0.75ML ~~LOC~~ SOAJ
SUBCUTANEOUS | 0 refills | Status: DC
  Filled 2022-04-21: qty 3, 30d supply, fill #0

## 2022-04-21 NOTE — Telephone Encounter (Signed)
Pt sent mychart message about this.

## 2022-04-21 NOTE — Telephone Encounter (Signed)
Needs to be a 30 minute visit. I have something 8/21 at 1130 if she wants to take that. Can you call her?

## 2022-04-21 NOTE — Telephone Encounter (Signed)
Pt is wanting the RN or Provider to call her back to discuss her headaches that she is feeling in her ear. Please advise.

## 2022-04-23 ENCOUNTER — Other Ambulatory Visit (HOSPITAL_COMMUNITY): Payer: Self-pay

## 2022-04-26 ENCOUNTER — Other Ambulatory Visit (HOSPITAL_COMMUNITY): Payer: Self-pay

## 2022-05-04 ENCOUNTER — Ambulatory Visit (INDEPENDENT_AMBULATORY_CARE_PROVIDER_SITE_OTHER): Payer: No Typology Code available for payment source | Admitting: Adult Health

## 2022-05-04 ENCOUNTER — Telehealth: Payer: No Typology Code available for payment source | Admitting: Adult Health

## 2022-05-04 VITALS — BP 110/71 | HR 81 | Ht 65.0 in | Wt 171.6 lb

## 2022-05-04 DIAGNOSIS — S0300XA Dislocation of jaw, unspecified side, initial encounter: Secondary | ICD-10-CM

## 2022-05-04 DIAGNOSIS — G43709 Chronic migraine without aura, not intractable, without status migrainosus: Secondary | ICD-10-CM | POA: Diagnosis not present

## 2022-05-04 MED ORDER — UBRELVY 100 MG PO TABS: 1.0000 | ORAL_TABLET | ORAL | 0 refills | Status: DC | PRN

## 2022-05-04 NOTE — Progress Notes (Signed)
PATIENT: [name] DOB: [birth year]  REASON FOR VISIT: follow up HISTORY FROM: patient Primary neurologist: Dr. Brett Fairy  Chief Complaint  Patient presents with   Follow-up    Rm 19, alone.  L ear/ side head pain.  Cannot get rid of it.  ENT seen and nothing seen.      HISTORY OF PRESENT ILLNESS: Today 05/04/22:  Ms. [name] is a 36 year old female with a history of migraine headaches.  She returns today for follow-up.  Today she is reporting continuous pain in the left ear.  She is already followed with ENT who did not find a cause.  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  They are trying conservative therapy with a mouthguard.  She reports sharp pain on the inside. Did ear drops but no help/ reports that the discomfort in the ear is always there but will get the sharp pain 6-7 times a day.   Reports she gets migraine twice a month- uses frovatriptan and that helps the severity of the headache but the headache will still linger for several days afterwards.  She has tried Imitrex and Maxalt in the past.Stopped propranolol- no changes in headache since she has been off the medication. No longer uses flexeril.    03/17/21: [name] is a 36 year old female with a history of migraine headaches.  She returns today for follow-up.  At the last visit we reduced her dose of propranolol to 10 mg twice a day.  She continues to take Flexeril 5 mg at bedtime as needed.  States that she uses this approximately 3 times a month.  If she gets a headache she normally will take frovatriptan.  And this works well for her.  She does state that she does not like taking daily medication for her migraines however right now they are controlled so she does not want to adjust her medication.  08/19/20: [name] is a 36 year old female with a history of migraine headaches.  She returns today for follow-up.  She is currently taking propranolol 20 mg daily.  She states that she try taking 20 mg twice a day but the  morning dose made her feel very tired.  She states that during the day she sometimes feels like her heart is racing.  This is not always due to stressful situations.  She continues to take Flexeril at bedtime.  She thinks that it has been helpful for tightness in the neck.  She reports that she has approximately 4 headaches a month.  Since the last visit she is only had to use frovatriptan 3 times.  She does feel like the medication has been helpful.  Ideally she would like to build to wean off of some of the medicine in the future.    REVIEW OF SYSTEMS: Out of a complete 14 system review of symptoms, the patient complains only of the following symptoms, and all other reviewed systems are negative.  See HPI  ALLERGIES: Allergies  Allergen Reactions   Macrobid [Nitrofurantoin Monohyd Macro] Other (See Comments)    FLU LIKE SYMPTONS   Penicillins Hives    Has patient had a PCN reaction causing immediate rash, facial/tongue/throat swelling, SOB or lightheadedness with hypotension: yes Has patient had a PCN reaction causing severe rash involving mucus membranes or skin necrosis: no Has patient had a PCN reaction that required hospitalization no Has patient had a PCN reaction occurring within the last 10 years: yes If all of the above answers are "NO", then  may proceed with Cephalosporin use.     HOME MEDICATIONS: Outpatient Medications Prior to Visit  Medication Sig Dispense Refill   propranolol (INDERAL) 10 MG tablet Take 1 tablet (10 mg total) by mouth 2 (two) times daily. 60 tablet 11   Semaglutide-Weight Management (WEGOVY) 1.7 MG/0.75ML SOAJ Inject 1 pen (1.7 mg)  into the skin once a week as directed 3 mL 0   triamcinolone (KENALOG) 6.962 % cream 1 application to affected area     Wheat Dextrin (BENEFIBER) POWD 1-2 tablespoons nightly-twice a day  0   ALPRAZolam (XANAX) 0.5 MG tablet Take 1 tablet by mouth once a day if needed (30 days) 10 tablet 1   AMBULATORY NON FORMULARY  MEDICATION Diltiazem 2% gel mixed with Lidocaine 5% Sig : apply a pea size amount to rectum three times daily 30 g 2   cyclobenzaprine (FLEXERIL) 5 MG tablet Take 1 tablet by mouth at bedtime as needed. 30 tablet 0   diphenhydrAMINE (BENADRYL) 25 MG tablet Take 25 mg by mouth at bedtime as needed for allergies or sleep.     fluconazole (DIFLUCAN) 150 MG tablet Take 1 tablet by mouth on days 1, 4, and 7 (Patient not taking: Reported on 05/04/2022) 3 tablet 0   frovatriptan (FROVA) 2.5 MG tablet Take 2.5 mg by mouth as needed.     hydrocortisone (ANUSOL-HC) 2.5 % rectal cream Use rectally twice daily as directed 30 g 0   ibuprofen (ADVIL) 600 MG tablet Take 600 mg by mouth 3 (three) times daily as needed.     ibuprofen (ADVIL) 600 MG tablet TAKE 1 TABLET BY MOUTH 3 TIMES DAILY AS NEEDED WITH FOOD OR MILK 90 tablet 1   neomycin-polymyxin-hydrocortisone (CORTISPORIN) OTIC solution Place 3 drops into the left ear 4 (four) times daily for 7 days (Patient not taking: Reported on 05/04/2022) 10 mL 0   propranolol (INDERAL) 10 MG tablet Take 1 tablet (10 mg total) by mouth 2 (two) times daily. 60 tablet 11   Semaglutide-Weight Management (WEGOVY) 2.4 MG/0.75ML SOAJ Inject 2.4 mg into the skin once every week (Patient not taking: Reported on 05/04/2022) 3 mL 0   ALPRAZolam (XANAX) 0.5 MG tablet Take 0.5 mg by mouth daily as needed.     ibuprofen (ADVIL) 600 MG tablet TAKE 1 TABLET BY MOUTH THREE TIMES DAILY WITH FOOD OR MILK AS NEEDED. 90 tablet 2   metroNIDAZOLE (FLAGYL) 500 MG tablet Take 1 tablet  by mouth twice a day for 7 days. 14 tablet 0   Semaglutide-Weight Management (WEGOVY [place]) Inject into the skin.     SUMAtriptan (IMITREX) 100 MG tablet Take 1 tablet by mouth once a day at least 2 hours between doses as needed (Patient not taking: Reported on 05/04/2022) 9 tablet 11   trimethoprim-polymyxin b (POLYTRIM) ophthalmic solution Place 1 drop into the right eye every 4 (four) hours. 10 mL 0   No  facility-administered medications prior to visit.    PAST MEDICAL HISTORY: Past Medical History:  Diagnosis Date   Anxiety    Asthma    CHILDHOOD   Bacterial vaginitis    Chronic anal fissure    Chronic kidney disease    INTERTERSIAL CYSTITIS   Complication of anesthesia    has concerns from anesthesia from  first  CS, breathing problems, 2nd csection legs numb   Contact dermatitis due to adhesives 07/06/2017   COVID-19    Frequency of urination    GERD (gastroesophageal reflux disease)    Headache  MIGRAINES    Heart murmur    mild no cardiologist   History of pre-eclampsia    Hypothyroidism    Interstitial cystitis    LGSIL on Pap smear of cervix 2010   Nocturia    Pelvic pain in female    PONV (postoperative nausea and vomiting)    Spotting between menses    Urgency of urination     PAST SURGICAL HISTORY: Past Surgical History:  Procedure Laterality Date   CESAREAN SECTION N/A 07/05/2017   Procedure: CESAREAN SECTION;  Surgeon: Brien Few, MD;  Location: [place];  Service: Obstetrics;  Laterality: N/A;   CESAREAN SECTION WITH BILATERAL TUBAL LIGATION N/A 11/21/2019   Procedure: CESAREAN SECTION WITH BILATERAL TUBAL LIGATION;  Surgeon: Brien Few, MD;  Location: [facility];  Service: Obstetrics;  Laterality: N/A;   CHROMOPERTUBATION Bilateral 07/23/2016   Procedure: CHROMOPERTUBATION;  Surgeon: Brien Few, MD;  Location: [facility];  Service: Gynecology;  Laterality: Bilateral;   COLPOSCOPY  2010   CYSTO WITH HYDRODISTENSION  10/14/2011   Procedure: CYSTOSCOPY/HYDRODISTENSION;  Surgeon: Malka So, MD;  Location: [facility];  Service: Urology;  Laterality: N/A;  M & P    DILATION AND CURETTAGE OF UTERUS  09/16/2011   Procedure: DILATATION AND CURETTAGE;  Surgeon: Gus Height;  Location: [facility];  Service: Gynecology;  Laterality: N/A;  Endometrial Biopsy, Urethral Dilation   DILATION AND EVACUATION N/A 02/26/2016   Procedure:  DILATATION AND EVACUATION/Chromosome Analysis;  Surgeon: Brien Few, MD;  Location: [facility];  Service: Gynecology;  Laterality: N/A;  with chromosome studies    DILITATION & CURRETTAGE/HYSTROSCOPY WITH NOVASURE ABLATION N/A 07/03/2020   Procedure: DILATATION & CURETTAGE/HYSTEROSCOPY WITH NOVASURE ABLATION;  Surgeon: Brien Few, MD;  Location: [place];  Service: Gynecology;  Laterality: N/A;   LAPAROSCOPIC LYSIS OF ADHESIONS     LAPAROSCOPY  09/16/2011   Procedure: LAPAROSCOPY OPERATIVE;  Surgeon: Gus Height;  Location: [facility];  Service: Gynecology;  Laterality: N/A;   LAPAROSCOPY N/A 07/23/2016   Procedure: LAPAROSCOPY OPERATIVE, LYSIS OF ADHESIONS;  Surgeon: Brien Few, MD;  Location: [facility];  Service: Gynecology;  Laterality: N/A;   NOSE SURGERY  2015   turbinate reduction   SEPTOPLASTY N/A 12/24/2019   Procedure: SEPTOPLASTY;  Surgeon: [name], MD;  Location: [facility];  Service: ENT;  Laterality: N/A;   WISDOM TOOTH EXTRACTION  2008- ORAL SURG OFFICE    FAMILY HISTORY: Family History  Problem Relation Age of Onset   Healthy Mother    Healthy Father    Hypertension Sister    Colon cancer Paternal Grandfather    Esophageal cancer Maternal Uncle    Stomach cancer Neg Hx    Liver disease Neg Hx     SOCIAL HISTORY: Social History   Socioeconomic History   Marital status: Married    Spouse name: Not on file   Number of children: Not on file   Years of education: Not on file   Highest education level: Not on file  Occupational History   Occupation: MRI technologist    Employer: [employer]  Tobacco Use   Smoking status: Former    Packs/day: 0.15    Years: 0.50    Total pack years: 0.08    Types: Cigarettes    Quit date: 02/25/2011    Years since quitting: 11.1   Smokeless tobacco: Never  Vaping Use   Vaping Use: Never used  Substance and Sexual Activity   Alcohol use:  No   Drug use: No   Sexual activity: Yes    Birth  control/protection: None, Abstinence  Other Topics Concern   Not on file  Social History Narrative   Married 1 son 1 daughter born 2018 in 2021   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  Generalized: Well developed, in no acute distress  HEENT: Both ear canals are clear tympanic membrane intact bilaterally Neurological examination  Mentation: Alert oriented to time, place, history taking. Follows all commands speech and language fluent Cranial nerve II-XII: Pupils were equal round reactive to light. Extraocular movements were full, visual field were full on confrontational test. Head turning and shoulder shrug  were normal and symmetric. Motor: The motor testing reveals 5 over 5 strength of all 4 extremities. Good symmetric motor tone is noted throughout.  Sensory: Sensory testing is intact to soft touch on all 4 extremities. No evidence of extinction is noted.  Coordination: Cerebellar testing reveals good finger-nose-finger and heel-to-shin bilaterally.  Gait and station: Gait is normal.  Reflexes: Deep tendon reflexes are symmetric and normal bilaterally.   DIAGNOSTIC DATA (LABS, IMAGING, TESTING) - I reviewed patient records, labs, notes, testing and imaging myself where available.  Lab Results  Component Value Date   WBC 8.2 07/03/2020   HGB 14.2 07/03/2020   HCT 42.6 07/03/2020   MCV 90.8 07/03/2020   PLT 410 (H) 07/03/2020      Component Value Date/Time   NA  135 07/04/2017 1743   K 4.0 07/04/2017 1743   CL 107 07/04/2017 1743   CO2 19 (L) 07/04/2017 1743   GLUCOSE 121 (H) 07/04/2017 1743   BUN 10 07/04/2017 1743   CREATININE 0.61 07/04/2017 1743   CALCIUM 8.7 (L) 07/04/2017 1743   PROT 5.8 (L) 07/04/2017 1743   ALBUMIN 2.9 (L) 07/04/2017 1743   AST 23 07/04/2017 1743   ALT 13 (L) 07/04/2017 1743   ALKPHOS 136 (H) 07/04/2017 1743   BILITOT 0.3 07/04/2017 1743   GFRNONAA >60 07/04/2017 1743   GFRAA >60 07/04/2017 1743      ASSESSMENT AND PLAN 36 y.o. year old female  has a past medical history of Anxiety, Asthma, Bacterial vaginitis, Chronic anal fissure, Chronic kidney disease, Complication of anesthesia, Contact dermatitis due to adhesives (07/06/2017), COVID-19, Frequency of urination, GERD (gastroesophageal reflux disease), Headache, Heart murmur, History of pre-eclampsia, Hypothyroidism, Interstitial cystitis, LGSIL on Pap smear of cervix (2010), Nocturia, Pelvic pain in female, PONV (postoperative nausea and vomiting), Spotting between menses, and Urgency of urination. here with:  1.  Migraine headaches 2.  TMJ?   -Restart Flexeril 5 mg at bedtime, may increase to 10 mg if not helpful.  If dose increase is also not beneficial may consider adding on nortriptyline -Try Ubrelvy 100 mg for abortive therapy.  Take at the onset of a migraine may repeat in 2 hours if  needed samples given today -Follow-up in 2 to 3 months or sooner if needed   Ward Givens, MSN, NP-C 05/04/2022, 10:18 AM [facility] [address] [place], [place] [address] [phone]

## 2022-05-04 NOTE — Patient Instructions (Addendum)
Your Plan:  Restart flexeril 5 mg at bedtime  TryUbrelvy for abortive therapy for migraine  If flexeril is not helpful can increase to 10 mg at bedtime or switch to nortriptyline   Thank you for coming to see [place] at [facility]. I hope we have been able to provide you high quality care today.  You may receive a patient satisfaction survey over the next few weeks. We would appreciate your feedback and comments so that we may continue to improve ourselves and the health of our patients.  Ubrogepant Tablets What is this medication? UBROGEPANT (ue BROE je pant) treats migraines. It works by blocking a substance in the body that causes migraines. It is not used to prevent migraines. This medicine may be used for other purposes; ask your health care provider or pharmacist if you have questions. COMMON BRAND NAME(S): Roselyn Meier What should I tell my care team before I take this medication? They need to know if you have any of these conditions: Kidney disease Liver disease An unusual or allergic reaction to ubrogepant, other medications, foods, dyes, or preservatives Pregnant or trying to get pregnant Breast-feeding How should I use this medication? Take this medication by mouth with a glass of water. Take it as directed on the prescription label. You can take it with or without food. If it upsets your stomach, take it with food. Keep taking it unless your care team tells you to stop. Talk to your care team about the use of this medication in children. Special care may be needed. Overdosage: If you think you have taken too much of this medicine contact a poison control center or emergency room at once. NOTE: This medicine is only for you. Do not share this medicine with others. What if I miss a dose? This does not apply. This medication is not for regular use. What may interact with this medication? Do not take this medication with any of the  following: Adagrasib Ceritinib Certain antibiotics, such as chloramphenicol, clarithromycin, telithromycin Certain antivirals for HIV, such as atazanavir, cobicistat, darunavir, delavirdine, fosamprenavir, indinavir, ritonavir Certain medications for fungal infections, such as itraconazole, ketoconazole, posaconazole, voriconazole Conivaptan Grapefruit Idelalisib Mifepristone Nefazodone Ribociclib This medication may also interact with the following: Carvedilol Certain medications for seizures, such as phenobarbital, phenytoin Ciprofloxacin Cyclosporine Eltrombopag Fluconazole Fluvoxamine Quinidine Rifampin St. John's wort Verapamil This list may not describe all possible interactions. Give your health care provider a list of all the medicines, herbs, non-prescription drugs, or dietary supplements you use. Also tell them if you smoke, drink alcohol, or use illegal drugs. Some items may interact with your medicine. What should I watch for while using this medication? Visit your care team for regular checks on your progress. Tell your care team if your symptoms do not start to get better or if they get worse. Your mouth may get dry. Chewing sugarless gum or sucking hard candy and drinking plenty of water may help. Contact your care team if the problem does not go away or is severe. What side effects may I notice from receiving this medication? Side effects that you should report to your care team as soon as possible: Allergic reactions--skin rash, itching, hives, swelling of the face, lips, tongue, or throat Side effects that usually do not require medical attention (report to your care team if they continue or are bothersome): Drowsiness Dry mouth Fatigue Nausea This list may not describe all possible side effects. Call your doctor for medical advice about side effects. You  may report side effects to FDA at 1-800-FDA-1088. Where should I keep my medication? Keep out of the reach  of children and pets. Store between 15 and 30 degrees C (59 and 86 degrees F). Get rid of any unused medication after the expiration date. To get rid of medications that are no longer needed or have expired: Take the medication to a medication take-back program. Check with your pharmacy or law enforcement to find a location. If you cannot return the medication, check the label or package insert to see if the medication should be thrown out in the garbage or flushed down the toilet. If you are not sure, ask your care team. If it is safe to put it in the trash, pour the medication out of the container. Mix the medication with cat litter, dirt, coffee grounds, or other unwanted substance. Seal the mixture in a bag or container. Put it in the trash. NOTE: This sheet is a summary. It may not cover all possible information. If you have questions about this medicine, talk to your doctor, pharmacist, or health care provider.  2023 Elsevier/Gold Standard (2021-09-11 00:00:00)

## 2022-05-11 ENCOUNTER — Encounter: Payer: Self-pay | Admitting: Internal Medicine

## 2022-05-11 NOTE — Telephone Encounter (Signed)
Pt stated that she has an appointment for a consultation in 2 weeks to see Dr. Shireen Quan at [facility] for her hemorrhoids: Pt states that she has been having issues with diarrhea off and on for a bout two years and is requesting to be seen about this: Pt was offered an earlier appointment with an APP but refused: Pt stated that she requested to see Dr. Carlean Purl: Pt was scheduled to see Dr. Carlean Purl on 07/20/2022 at 9:30 AM:  Pt verbalized understanding with all questions answered.

## 2022-05-12 ENCOUNTER — Other Ambulatory Visit: Payer: Self-pay | Admitting: Adult Health

## 2022-05-13 ENCOUNTER — Other Ambulatory Visit (HOSPITAL_COMMUNITY): Payer: Self-pay

## 2022-05-13 MED ORDER — CYCLOBENZAPRINE HCL 5 MG PO TABS
5.0000 mg | ORAL_TABLET | Freq: Every evening | ORAL | 2 refills | Status: DC | PRN
  Filled 2022-05-13: qty 30, 30d supply, fill #0

## 2022-05-19 ENCOUNTER — Other Ambulatory Visit (HOSPITAL_COMMUNITY): Payer: Self-pay

## 2022-05-20 ENCOUNTER — Other Ambulatory Visit (HOSPITAL_COMMUNITY): Payer: Self-pay

## 2022-05-20 MED ORDER — ALPRAZOLAM 0.5 MG PO TABS
0.5000 mg | ORAL_TABLET | Freq: Every day | ORAL | 1 refills | Status: DC | PRN
  Filled 2022-05-20: qty 10, 30d supply, fill #0
  Filled 2022-06-27: qty 10, 30d supply, fill #1

## 2022-05-29 ENCOUNTER — Other Ambulatory Visit (HOSPITAL_COMMUNITY): Payer: Self-pay

## 2022-05-31 ENCOUNTER — Other Ambulatory Visit (HOSPITAL_COMMUNITY): Payer: Self-pay

## 2022-05-31 MED ORDER — WEGOVY 1.7 MG/0.75ML ~~LOC~~ SOAJ
1.7000 mg | SUBCUTANEOUS | 0 refills | Status: DC
  Filled 2022-05-31: qty 3, 28d supply, fill #0

## 2022-06-01 ENCOUNTER — Other Ambulatory Visit (HOSPITAL_COMMUNITY): Payer: Self-pay

## 2022-06-17 ENCOUNTER — Encounter: Payer: Self-pay | Admitting: Adult Health

## 2022-06-17 ENCOUNTER — Other Ambulatory Visit (HOSPITAL_COMMUNITY): Payer: Self-pay

## 2022-06-17 ENCOUNTER — Other Ambulatory Visit: Payer: Self-pay | Admitting: Adult Health

## 2022-06-17 MED ORDER — UBRELVY 100 MG PO TABS: 1.0000 | ORAL_TABLET | ORAL | 1 refills | Status: DC | PRN

## 2022-06-17 MED ORDER — UBRELVY 100 MG PO TABS
ORAL_TABLET | ORAL | 11 refills | Status: DC
  Filled 2022-06-17: qty 10, 30d supply, fill #0

## 2022-06-19 ENCOUNTER — Other Ambulatory Visit (HOSPITAL_COMMUNITY): Payer: Self-pay

## 2022-06-21 ENCOUNTER — Other Ambulatory Visit (HOSPITAL_COMMUNITY): Payer: Self-pay

## 2022-06-21 ENCOUNTER — Telehealth: Payer: Self-pay | Admitting: *Deleted

## 2022-06-21 NOTE — Telephone Encounter (Signed)
Roselyn Meier approved. The authorization is effective from 06/21/2022 to 12/20/2022.

## 2022-06-21 NOTE — Telephone Encounter (Signed)
Roselyn Meier PA, Key: [id number] . Your information has been sent to [facility].

## 2022-06-22 ENCOUNTER — Other Ambulatory Visit (HOSPITAL_COMMUNITY): Payer: Self-pay

## 2022-06-24 NOTE — Progress Notes (Signed)
Sent message, via epic in basket, requesting orders in epic from surgeon.  

## 2022-06-27 ENCOUNTER — Encounter: Payer: Self-pay | Admitting: Adult Health

## 2022-06-28 ENCOUNTER — Encounter (HOSPITAL_COMMUNITY): Payer: Self-pay

## 2022-06-28 ENCOUNTER — Other Ambulatory Visit (HOSPITAL_COMMUNITY): Payer: Self-pay

## 2022-06-28 MED ORDER — CYCLOBENZAPRINE HCL 10 MG PO TABS
10.0000 mg | ORAL_TABLET | Freq: Every day | ORAL | 3 refills | Status: AC
  Filled 2022-06-28: qty 90, 90d supply, fill #0

## 2022-06-28 NOTE — Patient Instructions (Addendum)
SURGICAL WAITING ROOM VISITATION Patients having surgery or a procedure may have no more than 2 support people in the waiting area - these visitors may rotate.   Children under the age of 85 must have an adult with them who is not the patient. If the patient needs to stay at the hospital during part of their recovery, the visitor guidelines for inpatient rooms apply. Pre-op nurse will coordinate an appropriate time for 1 support person to accompany patient in pre-op.  This support person may not rotate.    Please refer to the [facility] website for the visitor guidelines for Inpatients (after your surgery is over and you are in a regular room).      Your procedure is scheduled on: 07-16-22   Report to [facility] Main Entrance    Report to admitting at 5:15 AM   Call this number if you have problems the morning of surgery [phone]   Do not eat food :After Midnight.   After Midnight you may have the following liquids until 4:30 AM DAY OF SURGERY  Water Non-Citrus Juices (without pulp, NO RED) Carbonated Beverages Black Coffee (NO MILK/CREAM OR CREAMERS, sugar ok)  Clear Tea (NO MILK/CREAM OR CREAMERS, sugar ok) regular and decaf                             Plain Jell-O (NO RED)                                           Fruit ices (not with fruit pulp, NO RED)                                     Popsicles (NO RED)                                                               Sports drinks like Gatorade (NO RED)                   The day of surgery:  Drink ONE (1) Pre-Surgery Clear Ensure or at 4:30 AM the morning of surgery. Drink in one sitting. Do not sip.  This drink was given to you during your hospital  pre-op appointment visit. Nothing else to drink after completing the Pre-Surgery Clear Ensure.          If you have questions, please contact your surgeon's office.   FOLLOW BOWEL PREP AND ANY ADDITIONAL PRE OP INSTRUCTIONS YOU RECEIVED FROM YOUR SURGEON'S  OFFICE!!!     Oral Hygiene is also important to reduce your risk of infection.                                    Remember - BRUSH YOUR TEETH THE MORNING OF SURGERY WITH YOUR REGULAR TOOTHPASTE   Do NOT smoke after Midnight   Take these medicines the morning of surgery with A SIP OF WATER:   Alprazolam  Propranolol  Frovatriptan if needed  Roselyn Meier if needed                              You may not have any metal on your body including hair pins, jewelry, and body piercing             Do not wear make-up, lotions, powders, perfumes or deodorant  Do not wear nail polish including gel and S&S, artificial/acrylic nails, or any other type of covering on natural nails including finger and toenails. If you have artificial nails, gel coating, etc. that needs to be removed by a nail salon please have this removed prior to surgery or surgery may need to be canceled/ delayed if the surgeon/ anesthesia feels like they are unable to be safely monitored.   Do not shave  48 hours prior to surgery.     Do not bring valuables to the hospital. [place].   Contacts, dentures or bridgework may not be worn into surgery.  DO NOT [place]. PHARMACY WILL DISPENSE MEDICATIONS LISTED ON YOUR MEDICATION LIST TO YOU DURING YOUR ADMISSION [place]!    Patients discharged on the day of surgery will not be allowed to drive home.  Someone NEEDS to stay with you for the first 24 hours after anesthesia.   Special Instructions: Bring a copy of your healthcare power of attorney and living will documents the day of surgery if you haven't scanned them before.              Please read over the following fact sheets you were given: IF [place] Gwen  If you received a COVID test during your pre-op visit  it is requested that you wear a mask when out in public, stay away from  anyone that may not be feeling well and notify your surgeon if you develop symptoms. If you test positive for Covid or have been in contact with anyone that has tested positive in the last 10 days please notify you surgeon.  [place] - Preparing for Surgery Before surgery, you can play an important role.  Because skin is not sterile, your skin needs to be as free of germs as possible.  You can reduce the number of germs on your skin by washing with CHG (chlorahexidine gluconate) soap before surgery.  CHG is an antiseptic cleaner which kills germs and bonds with the skin to continue killing germs even after washing. Please DO NOT use if you have an allergy to CHG or antibacterial soaps.  If your skin becomes reddened/irritated stop using the CHG and inform your nurse when you arrive at Short Stay. Do not shave (including legs and underarms) for at least 48 hours prior to the first CHG shower.  You may shave your face/neck.  Please follow these instructions carefully:  1.  Shower with CHG Soap the night before surgery and the  morning of surgery.  2.  If you choose to wash your hair, wash your hair first as usual with your normal  shampoo.  3.  After you shampoo, rinse your hair and body thoroughly to remove the shampoo.                             4.  Use CHG as you would any other liquid  soap.  You can apply chg directly to the skin and wash.  Gently with a scrungie or clean washcloth.  5.  Apply the CHG Soap to your body ONLY FROM THE NECK DOWN.   Do   not use on face/ open                           Wound or open sores. Avoid contact with eyes, ears mouth and   genitals (private parts).                       Wash face,  Genitals (private parts) with your normal soap.             6.  Wash thoroughly, paying special attention to the area where your    surgery  will be performed.  7.  Thoroughly rinse your body with warm water from the neck down.  8.  DO NOT shower/wash with your normal soap after  using and rinsing off the CHG Soap.                9.  Pat yourself dry with a clean towel.            10.  Wear clean pajamas.            11.  Place clean sheets on your bed the night of your first shower and do not  sleep with pets. Day of Surgery : Do not apply any lotions/deodorants the morning of surgery.  Please wear clean clothes to the hospital/surgery center.  FAILURE TO FOLLOW THESE INSTRUCTIONS MAY RESULT IN THE CANCELLATION OF YOUR SURGERY  PATIENT SIGNATURE_________________________________  NURSE SIGNATURE__________________________________  ________________________________________________________________________

## 2022-06-29 ENCOUNTER — Ambulatory Visit: Payer: Self-pay | Admitting: Surgery

## 2022-06-29 NOTE — Progress Notes (Signed)
[place] Dec at Dr. Earlie Server office for second request for Pre op orders.

## 2022-07-01 ENCOUNTER — Encounter (HOSPITAL_COMMUNITY): Payer: Self-pay

## 2022-07-01 ENCOUNTER — Encounter (HOSPITAL_COMMUNITY)
Admission: RE | Admit: 2022-07-01 | Discharge: 2022-07-01 | Disposition: A | Discharge status: Home / Self Care | Payer: No Typology Code available for payment source | Source: Ambulatory Visit | Attending: Surgery | Admitting: Surgery

## 2022-07-01 ENCOUNTER — Other Ambulatory Visit: Payer: Self-pay

## 2022-07-01 VITALS — BP 114/80 | HR 98 | Temp 98.7°F | Resp 16 | Ht 65.0 in | Wt 168.4 lb

## 2022-07-01 DIAGNOSIS — D649 Anemia, unspecified: Secondary | ICD-10-CM | POA: Diagnosis not present

## 2022-07-01 DIAGNOSIS — Z01812 Encounter for preprocedural laboratory examination: Secondary | ICD-10-CM | POA: Insufficient documentation

## 2022-07-01 DIAGNOSIS — Z01818 Encounter for other preprocedural examination: Secondary | ICD-10-CM

## 2022-07-01 HISTORY — DX: Anemia, unspecified: D64.9

## 2022-07-01 LAB — CBC
HCT: 39.2 % (ref 36.0–46.0)
Hemoglobin: 13.3 g/dL (ref 12.0–15.0)
MCH: 30.4 pg (ref 26.0–34.0)
MCHC: 33.9 g/dL (ref 30.0–36.0)
MCV: 89.5 fL (ref 80.0–100.0)
Platelets: 360 10*3/uL (ref 150–400)
RBC: 4.38 MIL/uL (ref 3.87–5.11)
RDW: 11.9 % (ref 11.5–15.5)
WBC: 10.2 10*3/uL (ref 4.0–10.5)
nRBC: 0 % (ref 0.0–0.2)

## 2022-07-01 NOTE — Progress Notes (Signed)
COVID Vaccine Completed:  Yes  Date of COVID positive in last 90 days:  No  PCP - Josetta Huddle, MD Cardiologist - N/A  Chest x-ray -  N/A EKG -  N/A Stress Test -  N/A ECHO -  N/A Cardiac Cath -  N/A Pacemaker/ICD device last checked: Spinal Cord Stimulator: N/A Holter monitor - 10 years ago  Bowel Prep - N/A  Sleep Study -  N/A CPAP -   Fasting Blood Sugar - N/A Checks Blood Sugar _____ times a day  Blood Thinner Instructions:  N/A Aspirin Instructions: Last Dose:  Activity level:  Can go up a flight of stairs and perform activities of daily living without stopping and without symptoms of chest pain or shortness of breath.  Able to exercise without symptoms  Anesthesia review:  [place]. Evaluated by Lyndon Code, PA-C  Patient denies shortness of breath, fever, cough and chest pain at PAT appointment  Patient verbalized understanding of instructions that were given to them at the PAT appointment. Patient was also instructed that they will need to review over the PAT instructions again at home before surgery.

## 2022-07-15 ENCOUNTER — Encounter (HOSPITAL_COMMUNITY): Payer: Self-pay | Admitting: Surgery

## 2022-07-15 NOTE — H&P (Signed)
REFERRING PHYSICIAN: Gatha Mayer, MD  PROVIDER: Joya San, MD  MRN: [id number] DOB: 1986-05-17 DATE OF ENCOUNTER: 06/10/2022  Subjective  Chief Complaint: New Consultation (Anal skin tag)   History of Present Illness: [name] is a 36 y.o. female who is seen today as an office consultation at the request of Dr. Carlean Purl for evaluation of New Consultation (Anal skin tag) .  The patient has been bothered with hemorrhoids since her pregnancy. She delivered with C-section but had painful problems in her perineum during pregnancy. She has had external hemorrhoids that have gotten swollen and tender periodically. She showed me a picture where there is both an anterior and a posterior hemorrhoid prolapse. To my exam today she has an anterior skin tag at its fairly prominent and certainly could play into hygiene problems. She would like it removed and possibly the one posteriorly to look at and remove. We discussed exam under anesthesia with excision. I told her this would be somewhat painful and hopefully would clear her with these issues. Previously seen Dr. Marcello Moores and they had some misunderstanding and she is referred back to me.  Review of Systems: See HPI as well for other ROS.  ROS  Medical History: Past Medical History: Diagnosis Date Thyroid disease  Patient Active Problem List Diagnosis Anal skin tag Chronic anal fissure Chronic interstitial cystitis Chronic migraine without aura Increased frequency of urination Difficult or painful urination Perianal dermatitis TMJ pain dysfunction syndrome  Past Surgical History: Procedure Laterality Date CESAREAN SECTION CLOSED REDUCTION NASAL BONE FRACTURE TRANSCERVICAL UTERINE FIBROID(S) ABLATION   Allergies Allergen Reactions Nitrofurantoin Monohyd/M-Cryst Other (See Comments) Aches, fever  FLU LIKE SYMPTONS Penicillins Hives and Rash Has patient had a PCN reaction causing immediate rash,  facial/tongue/throat swelling, SOB or lightheadedness with hypotension: yes Has patient had a PCN reaction causing severe rash involving mucus membranes or skin necrosis: no Has patient had a PCN reaction that required hospitalization no Has patient had a PCN reaction occurring within the last 10 years: yes If all of the above answers are "NO", then may proceed with Cephalosporin use.  Current Outpatient Medications on File Prior to Visit Medication Sig Dispense Refill cholecalciferol (VITAMIN D3) 2,000 unit capsule Take 2,000 Units by mouth once daily cyclobenzaprine (FLEXERIL) 5 MG tablet Take 1 tablet by mouth at bedtime as needed frovatriptan (FROVA) 2.5 MG tablet Take 2.5 mg by mouth as directed for Migraine If headache returns, may take a second dose after 2 hours. ibuprofen (MOTRIN) 600 MG tablet Take 600 mg by mouth every 6 (six) hours as needed for Pain  No current facility-administered medications on file prior to visit.  History reviewed. No pertinent family history.  Social History  Tobacco Use Smoking Status Never Smokeless Tobacco Never   Social History  Socioeconomic History Marital status: Married Tobacco Use Smoking status: Never Smokeless tobacco: Never Vaping Use Vaping Use: Never used Substance and Sexual Activity Alcohol use: Not Currently Drug use: Never  Objective:  Vitals: 06/10/22 0934 BP: 110/70 Pulse: (!) 125 SpO2: 98% Weight: 77.6 kg (171 lb) Height: 165.1 cm ('5\' 5"'$ )  Body mass index is 28.46 kg/m.  Physical Exam General: Well kempt white female who is in no acute distress HEENT : Unremarkable Chest: Clear to auscultation Heart: Sinus rhythm without murmurs Breast: Not examined Abdomen: Not examined GU not examined Rectal there is a skin tag which fairly prominent anteriorly. Presently there is no posterior sentinel tag although she has had a anal fissure in the past treated with  medication likely calcium channel  blocker. Extremities full range of motion Neuro alert and oriented x3. Motor and sensory function grossly intact  Labs, Imaging and Diagnostic Testing: None to review  Assessment and Plan: Diagnoses and all orders for this visit:  Grade II hemorrhoids    I have explained exam under anesthesia and would likely focus on the external skin tags anteriorly and looking for a intermittently prolapsing hemorrhoidal column posteriorly. I discussed lateral internal sphincterotomy but do not think that is necessary at this time. Discussed with patient in holding area.    Return for postop follow up.  [name], MD

## 2022-07-15 NOTE — Anesthesia Preprocedure Evaluation (Addendum)
Anesthesia Evaluation  Patient identified by MRN, date of birth, ID band Patient awake    Reviewed: Allergy & Precautions, NPO status , Patient's Chart, lab work & pertinent test results  History of Anesthesia Complications (+) PONV and history of anesthetic complications  Airway Mallampati: II  TM Distance: >3 FB Neck ROM: Full    Dental  (+) Dental Advisory Given, Teeth Intact   Pulmonary asthma , former smoker   Pulmonary exam normal        Cardiovascular negative cardio ROS Normal cardiovascular exam     Neuro/Psych  Headaches PSYCHIATRIC DISORDERS Anxiety        GI/Hepatic Neg liver ROS,GERD  Controlled,,  Endo/Other  Hypothyroidism    Renal/GU negative Renal ROS  Female GU complaint     Musculoskeletal negative musculoskeletal ROS (+)    Abdominal   Peds  Hematology negative hematology ROS (+)   Anesthesia Other Findings   Reproductive/Obstetrics  Hx Pre-E                              Anesthesia Physical Anesthesia Plan  ASA: 2  Anesthesia Plan: General   Post-op Pain Management: Tylenol PO (pre-op)*   Induction: Intravenous  PONV Risk Score and Plan: 4 or greater and Treatment may vary due to age or medical condition, Ondansetron, Dexamethasone, Midazolam and Scopolamine patch - Pre-op  Airway Management Planned: LMA  Additional Equipment: None  Intra-op Plan:   Post-operative Plan: Extubation in OR  Informed Consent: I have reviewed the patients History and Physical, chart, labs and discussed the procedure including the risks, benefits and alternatives for the proposed anesthesia with the patient or authorized representative who has indicated his/her understanding and acceptance.     Dental advisory given  Plan Discussed with: CRNA and Anesthesiologist  Anesthesia Plan Comments:        Anesthesia Quick Evaluation

## 2022-07-16 ENCOUNTER — Other Ambulatory Visit: Payer: Self-pay

## 2022-07-16 ENCOUNTER — Ambulatory Visit (HOSPITAL_COMMUNITY)
Admission: RE | Admit: 2022-07-16 | Discharge: 2022-07-16 | Disposition: A | Discharge status: Home / Self Care | Payer: No Typology Code available for payment source | Source: Ambulatory Visit | Attending: Surgery | Admitting: Surgery

## 2022-07-16 ENCOUNTER — Encounter (HOSPITAL_COMMUNITY): Payer: Self-pay | Admitting: Surgery

## 2022-07-16 ENCOUNTER — Other Ambulatory Visit (HOSPITAL_COMMUNITY): Payer: Self-pay

## 2022-07-16 ENCOUNTER — Ambulatory Visit (HOSPITAL_COMMUNITY): Payer: No Typology Code available for payment source | Admitting: Physician Assistant

## 2022-07-16 ENCOUNTER — Ambulatory Visit (HOSPITAL_BASED_OUTPATIENT_CLINIC_OR_DEPARTMENT_OTHER): Payer: No Typology Code available for payment source | Admitting: Anesthesiology

## 2022-07-16 ENCOUNTER — Encounter (HOSPITAL_COMMUNITY)
Admission: RE | Disposition: A | Discharge status: Home / Self Care | Payer: Self-pay | Source: Ambulatory Visit | Attending: Surgery

## 2022-07-16 DIAGNOSIS — E039 Hypothyroidism, unspecified: Secondary | ICD-10-CM | POA: Insufficient documentation

## 2022-07-16 DIAGNOSIS — K644 Residual hemorrhoidal skin tags: Secondary | ICD-10-CM | POA: Diagnosis present

## 2022-07-16 DIAGNOSIS — Z87891 Personal history of nicotine dependence: Secondary | ICD-10-CM | POA: Diagnosis not present

## 2022-07-16 DIAGNOSIS — K641 Second degree hemorrhoids: Secondary | ICD-10-CM | POA: Diagnosis not present

## 2022-07-16 DIAGNOSIS — Z01818 Encounter for other preprocedural examination: Secondary | ICD-10-CM

## 2022-07-16 DIAGNOSIS — L290 Pruritus ani: Secondary | ICD-10-CM | POA: Diagnosis not present

## 2022-07-16 DIAGNOSIS — K219 Gastro-esophageal reflux disease without esophagitis: Secondary | ICD-10-CM | POA: Diagnosis not present

## 2022-07-16 HISTORY — PX: EVALUATION UNDER ANESTHESIA WITH HEMORRHOIDECTOMY: SHX5624

## 2022-07-16 LAB — POCT PREGNANCY, URINE: Preg Test, Ur: NEGATIVE

## 2022-07-16 SURGERY — EXAM UNDER ANESTHESIA WITH HEMORRHOIDECTOMY
Anesthesia: General

## 2022-07-16 MED ORDER — CHLORHEXIDINE GLUCONATE CLOTH 2 % EX PADS: 6.0000 | MEDICATED_PAD | Freq: Once | CUTANEOUS | Status: DC

## 2022-07-16 MED ORDER — POVIDONE-IODINE 10 % EX OINT
TOPICAL_OINTMENT | CUTANEOUS | Status: DC | PRN
  Administered 2022-07-16: 1 via TOPICAL

## 2022-07-16 MED ORDER — LACTATED RINGERS IV SOLN: INTRAVENOUS | Status: DC

## 2022-07-16 MED ORDER — SCOPOLAMINE 1 MG/3DAYS TD PT72
1.0000 | MEDICATED_PATCH | TRANSDERMAL | Status: DC
  Administered 2022-07-16: 1.5 mg via TRANSDERMAL
  Filled 2022-07-16: qty 1

## 2022-07-16 MED ORDER — PROPOFOL 10 MG/ML IV BOLUS
INTRAVENOUS | Status: DC | PRN
  Administered 2022-07-16: 200 mg via INTRAVENOUS

## 2022-07-16 MED ORDER — ROCURONIUM BROMIDE 10 MG/ML (PF) SYRINGE
PREFILLED_SYRINGE | INTRAVENOUS | Status: AC
  Filled 2022-07-16: qty 10

## 2022-07-16 MED ORDER — LIDOCAINE HCL (CARDIAC) PF 100 MG/5ML IV SOSY
PREFILLED_SYRINGE | INTRAVENOUS | Status: DC | PRN
  Administered 2022-07-16: 80 mg via INTRAVENOUS

## 2022-07-16 MED ORDER — FENTANYL CITRATE PF 50 MCG/ML IJ SOSY
25.0000 ug | PREFILLED_SYRINGE | INTRAMUSCULAR | Status: DC | PRN
  Administered 2022-07-16 (×3): 50 ug via INTRAVENOUS

## 2022-07-16 MED ORDER — DEXAMETHASONE SODIUM PHOSPHATE 4 MG/ML IJ SOLN
INTRAMUSCULAR | Status: DC | PRN
  Administered 2022-07-16: 10 mg via INTRAVENOUS

## 2022-07-16 MED ORDER — POVIDONE-IODINE 10 % EX OINT
TOPICAL_OINTMENT | CUTANEOUS | Status: AC
  Filled 2022-07-16: qty 28.35

## 2022-07-16 MED ORDER — FENTANYL CITRATE PF 50 MCG/ML IJ SOSY
PREFILLED_SYRINGE | INTRAMUSCULAR | Status: AC
  Filled 2022-07-16: qty 1

## 2022-07-16 MED ORDER — HYDROMORPHONE HCL 1 MG/ML IJ SOLN
INTRAMUSCULAR | Status: AC
  Filled 2022-07-16: qty 2

## 2022-07-16 MED ORDER — OXYCODONE HCL 5 MG PO TABS
5.0000 mg | ORAL_TABLET | Freq: Once | ORAL | Status: AC | PRN
  Administered 2022-07-16: 5 mg via ORAL

## 2022-07-16 MED ORDER — FENTANYL CITRATE (PF) 100 MCG/2ML IJ SOLN
INTRAMUSCULAR | Status: DC | PRN
  Administered 2022-07-16 (×2): 50 ug via INTRAVENOUS

## 2022-07-16 MED ORDER — DEXAMETHASONE SODIUM PHOSPHATE 10 MG/ML IJ SOLN
INTRAMUSCULAR | Status: AC
  Filled 2022-07-16: qty 2

## 2022-07-16 MED ORDER — KETAMINE HCL 50 MG/5ML IJ SOSY
PREFILLED_SYRINGE | INTRAMUSCULAR | Status: AC
  Filled 2022-07-16: qty 5

## 2022-07-16 MED ORDER — PROMETHAZINE HCL 25 MG/ML IJ SOLN: 6.2500 mg | INTRAMUSCULAR | Status: DC | PRN

## 2022-07-16 MED ORDER — ONDANSETRON HCL 4 MG/2ML IJ SOLN
INTRAMUSCULAR | Status: DC | PRN
  Administered 2022-07-16: 4 mg via INTRAVENOUS

## 2022-07-16 MED ORDER — OXYCODONE HCL 5 MG PO TABS
ORAL_TABLET | ORAL | Status: AC
  Filled 2022-07-16: qty 1

## 2022-07-16 MED ORDER — HYDROMORPHONE HCL 1 MG/ML IJ SOLN
0.2500 mg | INTRAMUSCULAR | Status: DC | PRN
  Administered 2022-07-16 (×4): 0.5 mg via INTRAVENOUS

## 2022-07-16 MED ORDER — ACETAMINOPHEN 500 MG PO TABS
1000.0000 mg | ORAL_TABLET | Freq: Once | ORAL | Status: AC
  Administered 2022-07-16: 1000 mg via ORAL
  Filled 2022-07-16: qty 2

## 2022-07-16 MED ORDER — PROPOFOL 10 MG/ML IV BOLUS
INTRAVENOUS | Status: AC
  Filled 2022-07-16: qty 20

## 2022-07-16 MED ORDER — MIDAZOLAM HCL 2 MG/2ML IJ SOLN
INTRAMUSCULAR | Status: AC
  Filled 2022-07-16: qty 2

## 2022-07-16 MED ORDER — LIDOCAINE HCL (PF) 2 % IJ SOLN
INTRAMUSCULAR | Status: AC
  Filled 2022-07-16: qty 10

## 2022-07-16 MED ORDER — OXYCODONE HCL 5 MG/5ML PO SOLN: 5.0000 mg | Freq: Once | ORAL | Status: AC | PRN

## 2022-07-16 MED ORDER — ESMOLOL HCL 100 MG/10ML IV SOLN
INTRAVENOUS | Status: AC
  Filled 2022-07-16: qty 10

## 2022-07-16 MED ORDER — ISOPROPYL ALCOHOL 70 % SOLN
Status: AC
  Filled 2022-07-16: qty 480

## 2022-07-16 MED ORDER — ONDANSETRON HCL 4 MG/2ML IJ SOLN
INTRAMUSCULAR | Status: AC
  Filled 2022-07-16: qty 4

## 2022-07-16 MED ORDER — KETAMINE HCL 10 MG/ML IJ SOLN
INTRAMUSCULAR | Status: DC | PRN
  Administered 2022-07-16: 40 mg via INTRAVENOUS
  Administered 2022-07-16: 10 mg via INTRAVENOUS

## 2022-07-16 MED ORDER — CHLORHEXIDINE GLUCONATE 0.12 % MT SOLN
15.0000 mL | Freq: Once | OROMUCOSAL | Status: AC
  Administered 2022-07-16: 15 mL via OROMUCOSAL

## 2022-07-16 MED ORDER — MIDAZOLAM HCL 5 MG/5ML IJ SOLN
INTRAMUSCULAR | Status: DC | PRN
  Administered 2022-07-16: 2 mg via INTRAVENOUS

## 2022-07-16 MED ORDER — BUPIVACAINE LIPOSOME 1.3 % IJ SUSP
INTRAMUSCULAR | Status: DC | PRN
  Administered 2022-07-16: 16 mL

## 2022-07-16 MED ORDER — BUPIVACAINE LIPOSOME 1.3 % IJ SUSP
INTRAMUSCULAR | Status: AC
  Filled 2022-07-16: qty 20

## 2022-07-16 MED ORDER — FENTANYL CITRATE (PF) 100 MCG/2ML IJ SOLN
INTRAMUSCULAR | Status: AC
  Filled 2022-07-16: qty 2

## 2022-07-16 MED ORDER — ORAL CARE MOUTH RINSE: 15.0000 mL | Freq: Once | OROMUCOSAL | Status: AC

## 2022-07-16 MED ORDER — FENTANYL CITRATE PF 50 MCG/ML IJ SOSY
PREFILLED_SYRINGE | INTRAMUSCULAR | Status: AC
  Filled 2022-07-16: qty 2

## 2022-07-16 MED ORDER — SODIUM CHLORIDE (PF) 0.9 % IJ SOLN
INTRAMUSCULAR | Status: AC
  Filled 2022-07-16: qty 10

## 2022-07-16 MED ORDER — HYDROCODONE-ACETAMINOPHEN 5-325 MG PO TABS
1.0000 | ORAL_TABLET | Freq: Four times a day (QID) | ORAL | 0 refills | Status: DC | PRN
  Filled 2022-07-16: qty 20, 5d supply, fill #0

## 2022-07-16 SURGICAL SUPPLY — 29 items
BAG COUNTER SPONGE SURGICOUNT (BAG) IMPLANT
BAG SPNG CNTER NS LX DISP (BAG)
BLADE HEX COATED 2.75 (ELECTRODE) ×1 IMPLANT
BLADE SURG 15 STRL LF DISP TIS (BLADE) ×1 IMPLANT
BLADE SURG 15 STRL SS (BLADE) ×1
BRIEF MESH DISP LRG (UNDERPADS AND DIAPERS) ×1 IMPLANT
COVER SURGICAL LIGHT HANDLE (MISCELLANEOUS) ×1 IMPLANT
ELECT PENCIL ROCKER SW 15FT (MISCELLANEOUS) IMPLANT
ELECT REM PT RETURN 15FT ADLT (MISCELLANEOUS) ×1 IMPLANT
GAUZE 4X4 16PLY ~~LOC~~+RFID DBL (SPONGE) ×1 IMPLANT
GAUZE PAD ABD 8X10 STRL (GAUZE/BANDAGES/DRESSINGS) IMPLANT
GAUZE SPONGE 4X4 12PLY STRL (GAUZE/BANDAGES/DRESSINGS) ×1 IMPLANT
GLOVE SURG LX STRL 8.0 MICRO (GLOVE) ×1 IMPLANT
GOWN SPEC L4 XLG W/TWL (GOWN DISPOSABLE) ×1 IMPLANT
GOWN STRL REUS W/ TWL XL LVL3 (GOWN DISPOSABLE) ×3 IMPLANT
GOWN STRL REUS W/TWL XL LVL3 (GOWN DISPOSABLE) ×3
KIT BASIN OR (CUSTOM PROCEDURE TRAY) ×1 IMPLANT
KIT TURNOVER KIT A (KITS) IMPLANT
NEEDLE HYPO 22GX1.5 SAFETY (NEEDLE) ×1 IMPLANT
PACK LITHOTOMY IV (CUSTOM PROCEDURE TRAY) ×1 IMPLANT
PENCIL SMOKE EVACUATOR (MISCELLANEOUS) IMPLANT
SHEARS HARMONIC 9CM CVD (BLADE) IMPLANT
SPIKE FLUID TRANSFER (MISCELLANEOUS) ×1 IMPLANT
SPONGE HEMORRHOID 8X3CM (HEMOSTASIS) IMPLANT
SURGILUBE 2OZ TUBE FLIPTOP (MISCELLANEOUS) ×1 IMPLANT
SUT CHROMIC 2 0 SH (SUTURE) IMPLANT
SUT CHROMIC 3 0 SH 27 (SUTURE) IMPLANT
SYR 20ML LL LF (SYRINGE) ×1 IMPLANT
UNDERPAD 30X36 HEAVY ABSORB (UNDERPADS AND DIAPERS) ×1 IMPLANT

## 2022-07-16 NOTE — Anesthesia Procedure Notes (Signed)
Procedure Name: LMA Insertion Date/Time: 07/16/2022 8:00 AM  Performed by: Lavina Hamman, CRNAPre-anesthesia Checklist: Patient identified, Emergency Drugs available, Suction available, Patient being monitored and Timeout performed Patient Re-evaluated:Patient Re-evaluated prior to induction Oxygen Delivery Method: Circle system utilized Preoxygenation: Pre-oxygenation with 100% oxygen Induction Type: IV induction Ventilation: Mask ventilation without difficulty LMA: LMA inserted LMA Size: 4.0 Number of attempts: 1 Placement Confirmation: positive ETCO2 and breath sounds checked- equal and bilateral Tube secured with: Tape Dental Injury: Teeth and Oropharynx as per pre-operative assessment  Comments: Easy LMA.

## 2022-07-16 NOTE — Transfer of Care (Signed)
Immediate Anesthesia Transfer of Care Note  Patient: [name]  Procedure(s) Performed: Procedure(s): EXAM UNDER ANESTHESIA WITH HEMORRHOIDECTOMY (N/A)  Patient Location: PACU  Anesthesia Type:General  Level of Consciousness:  sedated, patient cooperative and responds to stimulation  Airway & Oxygen Therapy:Patient Spontanous Breathing and Patient connected to face mask oxgen  Post-op Assessment:  Report given to PACU RN and Post -op Vital signs reviewed and stable  Post vital signs:  Reviewed and stable  Last Vitals:  Vitals:   07/16/22 0548 07/16/22 0850  BP: 103/73 127/88  Pulse: 90 (!) 115  Resp: 15 18  Temp: 36.9 C 36.6 C  SpO2: 05% 110%    Complications: No apparent anesthesia complications

## 2022-07-16 NOTE — Op Note (Signed)
[place]  1986-06-12   07/16/2022    PCP:  Josetta Huddle, MD   Surgeon: Kaylyn Lim, MD, FACS  Asst:  none  Anes:  general  Preop Dx: Pruritus ani and external hemorrhoidectomy Postop Dx: Anterior and posterior external hemorrhoids  Procedure: Exam under anesthesia, two column external hemorrhoidectomy Location Surgery: [facility] Complications: none  EBL:   minimal cc  Drains: none  Description of Procedure:  The patient was taken to OR 5 .  After anesthesia was administered and the patient was prepped  with betadine  and a timeout was performed.  Digital exam was performed first.  The patient's had a good prep.  Zan with the bullet retractor and all 4 quadrants showed main problems at 12:00 and 5:00 with the patient in the dorsolithotomy position.  These were significant skin tags as well as they had hemorrhoidal components.  I gracilis first at the 12 o'clock position and and excised this hemorrhoidal column.  Incisions were made in the dissection of the vessels was done subcutaneously and then the external hemorrhoid and skin tag was removed.  This was closed with a running 3-0 chromic.  Similarly in the 5 o'clock position this was grasped incised in a vertical fashion with the dissection of the vessels from the underlying muscle and then removal of excess skin.  This tube was closed with running 3-0 chromic.  Prior to the incisions I had done a complete block around the anus with Exparel diluted to 30 cc.  Betadine ointment was massaged into the area and patient was awakened taken recovery in satisfactory condition.  The patient tolerated the procedure well and was taken to the PACU in stable condition.     Matt B. Hassell Done, [place], [facility], [place]

## 2022-07-16 NOTE — Interval H&P Note (Signed)
History and Physical Interval Note:  07/16/2022 7:36 AM  [name]  has presented today for surgery, with the diagnosis of [place].  The various methods of treatment have been discussed with the patient and family. After consideration of risks, benefits and other options for treatment, the patient has consented to  Procedure(s): EXAM UNDER ANESTHESIA WITH HEMORRHOIDECTOMY (N/A) as a surgical intervention.  The patient's history has been reviewed, patient examined, no change in status, stable for surgery.  I have reviewed the patient's chart and labs.  Questions were answered to the patient's satisfaction.     Pedro Earls

## 2022-07-16 NOTE — Anesthesia Postprocedure Evaluation (Signed)
Anesthesia Post Note  Patient: [name]  Procedure(s) Performed: EXAM UNDER ANESTHESIA WITH HEMORRHOIDECTOMY     Patient location during evaluation: PACU Anesthesia Type: General Level of consciousness: awake and alert Pain management: pain level controlled Vital Signs Assessment: post-procedure vital signs reviewed and stable Respiratory status: spontaneous breathing, nonlabored ventilation and respiratory function stable Cardiovascular status: stable and blood pressure returned to baseline Anesthetic complications: no   No notable events documented.  Last Vitals:  Vitals:   07/16/22 1000 07/16/22 1010  BP: 120/84 120/74  Pulse: 81 88  Resp: 15 16  Temp: 36.5 C 36.5 C  SpO2: 98% 96%    Last Pain:  Vitals:   07/16/22 1010  TempSrc:   PainSc: 0-No pain                 Audry Pili

## 2022-07-17 ENCOUNTER — Encounter (HOSPITAL_COMMUNITY): Payer: Self-pay | Admitting: Surgery

## 2022-07-19 LAB — SURGICAL PATHOLOGY

## 2022-07-20 ENCOUNTER — Ambulatory Visit: Payer: No Typology Code available for payment source | Admitting: Internal Medicine

## 2022-07-21 ENCOUNTER — Other Ambulatory Visit (HOSPITAL_COMMUNITY): Payer: Self-pay

## 2022-07-21 MED ORDER — HYDROCODONE-ACETAMINOPHEN 5-325 MG PO TABS
1.0000 | ORAL_TABLET | Freq: Four times a day (QID) | ORAL | 0 refills | Status: DC | PRN
  Filled 2022-07-21: qty 20, 5d supply, fill #0

## 2022-07-21 MED ORDER — ALPRAZOLAM 0.5 MG PO TABS
0.5000 mg | ORAL_TABLET | Freq: Every day | ORAL | 0 refills | Status: DC | PRN
  Filled 2022-07-21: qty 15, 15d supply, fill #0
  Filled 2022-07-27: qty 15, 30d supply, fill #0

## 2022-07-28 ENCOUNTER — Other Ambulatory Visit (HOSPITAL_COMMUNITY): Payer: Self-pay

## 2022-08-10 ENCOUNTER — Telehealth: Payer: No Typology Code available for payment source | Admitting: Adult Health

## 2022-08-12 ENCOUNTER — Other Ambulatory Visit (HOSPITAL_COMMUNITY): Payer: Self-pay

## 2022-08-12 MED ORDER — WEGOVY 0.5 MG/0.5ML ~~LOC~~ SOAJ
0.5000 mg | SUBCUTANEOUS | 0 refills | Status: DC
  Filled 2022-08-12 (×2): qty 2, 28d supply, fill #0

## 2022-08-13 ENCOUNTER — Other Ambulatory Visit: Payer: Self-pay | Admitting: Internal Medicine

## 2022-08-13 ENCOUNTER — Ambulatory Visit
Admission: RE | Admit: 2022-08-13 | Discharge: 2022-08-13 | Disposition: A | Discharge status: Home / Self Care | Payer: No Typology Code available for payment source | Source: Ambulatory Visit | Attending: Internal Medicine | Admitting: Internal Medicine

## 2022-08-13 DIAGNOSIS — S6992XA Unspecified injury of left wrist, hand and finger(s), initial encounter: Secondary | ICD-10-CM

## 2022-08-17 ENCOUNTER — Telehealth: Payer: No Typology Code available for payment source | Admitting: Family Medicine

## 2022-08-17 ENCOUNTER — Other Ambulatory Visit (HOSPITAL_COMMUNITY): Payer: Self-pay

## 2022-08-17 DIAGNOSIS — J208 Acute bronchitis due to other specified organisms: Secondary | ICD-10-CM | POA: Diagnosis not present

## 2022-08-17 MED ORDER — BENZONATATE 100 MG PO CAPS
100.0000 mg | ORAL_CAPSULE | Freq: Two times a day (BID) | ORAL | 0 refills | Status: DC | PRN
  Filled 2022-08-17: qty 20, 10d supply, fill #0

## 2022-08-17 MED ORDER — ALBUTEROL SULFATE HFA 108 (90 BASE) MCG/ACT IN AERS
2.0000 | INHALATION_SPRAY | Freq: Four times a day (QID) | RESPIRATORY_TRACT | 0 refills | Status: AC | PRN
  Filled 2022-08-17: qty 6.7, 25d supply, fill #0

## 2022-08-17 MED ORDER — PREDNISONE 10 MG (21) PO TBPK
ORAL_TABLET | ORAL | 0 refills | Status: AC
  Filled 2022-08-17: qty 21, 6d supply, fill #0

## 2022-08-17 NOTE — Progress Notes (Signed)
We are sorry that you are not feeling well.  Here is how we plan to help!  Based on your presentation I believe you most likely have A cough due to a virus.  This is called viral bronchitis and is best treated by rest, plenty of fluids and control of the cough.  You may use Ibuprofen or Tylenol as directed to help your symptoms.     In addition you may use A non-prescription cough medication called Mucinex DM: take 2 tablets every 12 hours. and A prescription cough medication called Tessalon Perles '100mg'$ . You may take 1-2 capsules every 8 hours as needed for your cough.  Prednisone 10 mg daily for 6 days (see taper instructions below)  I will also order an albuterol inhaler for the wheezing.  From your responses in the eVisit questionnaire you describe inflammation in the upper respiratory tract which is causing a significant cough.  This is commonly called Bronchitis and has four common causes:   Allergies Viral Infections Acid Reflux Bacterial Infection Allergies, viruses and acid reflux are treated by controlling symptoms or eliminating the cause. An example might be a cough caused by taking certain blood pressure medications. You stop the cough by changing the medication. Another example might be a cough caused by acid reflux. Controlling the reflux helps control the cough.  USE OF BRONCHODILATOR ("RESCUE") INHALERS: There is a risk from using your bronchodilator too frequently.  The risk is that over-reliance on a medication which only relaxes the muscles surrounding the breathing tubes can reduce the effectiveness of medications prescribed to reduce swelling and congestion of the tubes themselves.  Although you feel brief relief from the bronchodilator inhaler, your asthma may actually be worsening with the tubes becoming more swollen and filled with mucus.  This can delay other crucial treatments, such as oral steroid medications. If you need to use a bronchodilator inhaler daily, several  times per day, you should discuss this with your provider.  There are probably better treatments that could be used to keep your asthma under control.     HOME CARE Only take medications as instructed by your medical team. Complete the entire course of an antibiotic. Drink plenty of fluids and get plenty of rest. Avoid close contacts especially the very young and the elderly Cover your mouth if you cough or cough into your sleeve. Always remember to wash your hands A steam or ultrasonic humidifier can help congestion.   GET HELP RIGHT AWAY IF: You develop worsening fever. You become short of breath You cough up blood. Your symptoms persist after you have completed your treatment plan MAKE SURE YOU  Understand these instructions. Will watch your condition. Will get help right away if you are not doing well or get worse.    Thank you for choosing an e-visit.  Your e-visit answers were reviewed by a board certified advanced clinical practitioner to complete your personal care plan. Depending upon the condition, your plan could have included both over the counter or prescription medications.  Please review your pharmacy choice. Make sure the pharmacy is open so you can pick up prescription now. If there is a problem, you may contact your provider through [employer] and have the prescription routed to another pharmacy.  Your safety is important to [place]. If you have drug allergies check your prescription carefully.   For the next 24 hours you can use MyChart to ask questions about today's visit, request a non-urgent call back, or ask for a  work or school excuse. You will get an email in the next two days asking about your experience. I hope that your e-visit has been valuable and will speed your recovery.  I provided 5 minutes of non face-to-face time during this encounter for chart review, medication and order placement, as well as and documentation.

## 2022-08-18 ENCOUNTER — Other Ambulatory Visit (HOSPITAL_COMMUNITY): Payer: Self-pay

## 2022-08-18 MED ORDER — ALPRAZOLAM 0.5 MG PO TABS
0.5000 mg | ORAL_TABLET | Freq: Every day | ORAL | 0 refills | Status: DC | PRN
  Filled 2022-08-18 – 2022-09-02 (×2): qty 15, 30d supply, fill #0

## 2022-08-23 ENCOUNTER — Other Ambulatory Visit (HOSPITAL_COMMUNITY): Payer: Self-pay

## 2022-09-01 ENCOUNTER — Other Ambulatory Visit: Payer: Self-pay

## 2022-09-01 ENCOUNTER — Other Ambulatory Visit (HOSPITAL_COMMUNITY): Payer: Self-pay

## 2022-09-01 MED ORDER — WEGOVY 1 MG/0.5ML ~~LOC~~ SOAJ
SUBCUTANEOUS | 0 refills | Status: DC
  Filled 2022-09-01: qty 2, 28d supply, fill #0

## 2022-09-01 MED ORDER — METOPROLOL SUCCINATE ER 25 MG PO TB24
ORAL_TABLET | ORAL | 0 refills | Status: DC
  Filled 2022-09-01: qty 90, 90d supply, fill #0

## 2022-09-01 MED ORDER — KAPSPARGO SPRINKLE 25 MG PO CS24: 25.0000 mg | EXTENDED_RELEASE_CAPSULE | Freq: Every day | ORAL | 0 refills | Status: DC

## 2022-09-02 ENCOUNTER — Other Ambulatory Visit (HOSPITAL_COMMUNITY): Payer: Self-pay

## 2022-09-02 ENCOUNTER — Other Ambulatory Visit: Payer: Self-pay

## 2022-09-08 DIAGNOSIS — H5711 Ocular pain, right eye: Secondary | ICD-10-CM | POA: Insufficient documentation

## 2022-09-08 DIAGNOSIS — N3010 Interstitial cystitis (chronic) without hematuria: Secondary | ICD-10-CM | POA: Insufficient documentation

## 2022-09-08 DIAGNOSIS — M6248 Contracture of muscle, other site: Secondary | ICD-10-CM | POA: Insufficient documentation

## 2022-09-08 DIAGNOSIS — Z658 Other specified problems related to psychosocial circumstances: Secondary | ICD-10-CM

## 2022-09-08 DIAGNOSIS — Z79899 Other long term (current) drug therapy: Secondary | ICD-10-CM | POA: Insufficient documentation

## 2022-09-08 DIAGNOSIS — E668 Other obesity: Secondary | ICD-10-CM

## 2022-09-08 DIAGNOSIS — D1724 Benign lipomatous neoplasm of skin and subcutaneous tissue of left leg: Secondary | ICD-10-CM | POA: Insufficient documentation

## 2022-09-08 DIAGNOSIS — J9801 Acute bronchospasm: Secondary | ICD-10-CM | POA: Insufficient documentation

## 2022-09-08 DIAGNOSIS — G43009 Migraine without aura, not intractable, without status migrainosus: Secondary | ICD-10-CM

## 2022-09-08 DIAGNOSIS — J0181 Other acute recurrent sinusitis: Secondary | ICD-10-CM

## 2022-09-08 DIAGNOSIS — R509 Fever, unspecified: Secondary | ICD-10-CM | POA: Insufficient documentation

## 2022-09-08 DIAGNOSIS — R0789 Other chest pain: Secondary | ICD-10-CM | POA: Insufficient documentation

## 2022-09-08 DIAGNOSIS — F419 Anxiety disorder, unspecified: Secondary | ICD-10-CM | POA: Insufficient documentation

## 2022-09-08 DIAGNOSIS — F410 Panic disorder [episodic paroxysmal anxiety] without agoraphobia: Secondary | ICD-10-CM | POA: Insufficient documentation

## 2022-09-08 DIAGNOSIS — R3589 Other polyuria: Secondary | ICD-10-CM | POA: Insufficient documentation

## 2022-09-08 DIAGNOSIS — T1490XA Injury, unspecified, initial encounter: Secondary | ICD-10-CM | POA: Insufficient documentation

## 2022-09-08 DIAGNOSIS — J309 Allergic rhinitis, unspecified: Secondary | ICD-10-CM | POA: Insufficient documentation

## 2022-09-08 DIAGNOSIS — M2550 Pain in unspecified joint: Secondary | ICD-10-CM | POA: Insufficient documentation

## 2022-09-08 DIAGNOSIS — K219 Gastro-esophageal reflux disease without esophagitis: Secondary | ICD-10-CM | POA: Insufficient documentation

## 2022-09-08 DIAGNOSIS — J069 Acute upper respiratory infection, unspecified: Secondary | ICD-10-CM

## 2022-09-08 DIAGNOSIS — M791 Myalgia, unspecified site: Secondary | ICD-10-CM | POA: Insufficient documentation

## 2022-09-08 DIAGNOSIS — J0191 Acute recurrent sinusitis, unspecified: Secondary | ICD-10-CM | POA: Insufficient documentation

## 2022-09-08 DIAGNOSIS — R002 Palpitations: Secondary | ICD-10-CM

## 2022-09-08 NOTE — Progress Notes (Unsigned)
Cardiology Office Note:    Date:  09/09/2022   ID:  [name], DOB Feb 24, 1986, MRN [id number]  PCP:  Ashley Huddle, MD   [place] Prov[facility] Cardiologist:  Ashley Sciara, MD Referring MD: Ashley Huddle, MD   Chief Complaint/Reason for Referral: Palpitations  ASSESSMENT:    1. Palpitations     PLAN:    In order of problems listed above: 1.  Palpitations: We will obtain a monitor, echocardiogram, and check a reflex TSH, CMP, and CBC.  I have asked her to hold her metoprolol to see if we can ascertain exactly what rhythm disturbance occurred.  If her reported heart rate was correct above 200 this may represent an SVT.             Dispo:  Return in about 3 months (around 12/09/2022).      Medication Adjustments/Labs and Tests Ordered: Current medicines are reviewed at length with the patient today.  Concerns regarding medicines are outlined above.  The following changes have been made:     Labs/tests ordered: Orders Placed This Encounter  Procedures   TSH Rfx on Abnormal to Free T4   Comprehensive metabolic panel   CBC with Differential/Platelet   LONG TERM MONITOR (3-14 DAYS)   EKG 12-Lead   ECHOCARDIOGRAM COMPLETE    Medication Changes: No orders of the defined types were placed in this encounter.    Current medicines are reviewed at length with the patient today.  The patient does not have concerns regarding medicines.   History of Present Illness:    FOCUSED PROBLEM LIST:   1.  Hypertension 2.  GERD 3.  Anxiety and panic attacks  The patient is a 36 y.o. female with the indicated medical history here for recommendations regarding palpitations.  The patient was seen by her primary care provider recently.  She had an episode heart racing starting around 7 AM.  Labs were checked which demonstrated a hemoglobin of 13, white blood cell count of 8.2, platelets 369, sodium 142, potassium 4.4, creatinine 0.48, and negative troponin.  Her heart rate  was reportedly above 200.  She had previously been on propranolol for headaches but this had been stopped several months ago.  She was started on metoprolol.  Since that time she had another episode that woke her up from sleep but was not as severe.  She denies any exertional chest pain, shortness of breath, or severe bleeding or bruising.  She was quite dizzy after her very severe episode.  She smokes very rarely and the last time was several months ago.  She works at [employer] in the [place].         Current Medications: Current Meds  Medication Sig   albuterol (VENTOLIN HFA) 108 (90 Base) MCG/ACT inhaler Inhale 2 puffs into the lungs every 6 (six) hours as needed for wheezing or shortness of breath.   ALPRAZolam (XANAX) 0.5 MG tablet Take 1 tablet (0.5 mg total) by mouth daily as needed. (30 day supply)   AMBULATORY NON FORMULARY MEDICATION Diltiazem 2% gel mixed with Lidocaine 5% Sig : apply a pea size amount to rectum three times daily   Cholecalciferol (VITAMIN D3) 50 MCG (2000 UT) capsule Take 2,000 Units by mouth daily.   cyclobenzaprine (FLEXERIL) 10 MG tablet Take 1 tablet (10 mg total) by mouth at bedtime.   diphenhydrAMINE (BENADRYL) 25 MG tablet Take 25 mg by mouth at bedtime as needed for allergies or sleep.   frovatriptan (FROVA) 2.5  MG tablet Take 2.5 mg by mouth daily as needed for migraine.   Semaglutide-Weight Management (WEGOVY) 0.5 MG/0.5ML SOAJ Inject 0.5 mg into the skin once a week - on the same day of each week for 4 weeks as directed.   triamcinolone (KENALOG) 0.025 % cream Apply 1 Application topically daily as needed (Rash).   Ubrogepant (UBRELVY) 100 MG TABS Take 1 tablet by mouth as needed.   [DISCONTINUED] fluconazole (DIFLUCAN) 150 MG tablet Take 1 tablet by mouth on days 1, 4, and 7 (Patient taking differently: Take 150 mg by mouth daily as needed (infection).)   [DISCONTINUED] hydrocortisone (ANUSOL-HC) 2.5 % rectal cream Use rectally  twice daily as directed (Patient taking differently: Place 1 application  rectally daily as needed for hemorrhoids or anal itching.)   [DISCONTINUED] ibuprofen (ADVIL) 600 MG tablet TAKE 1 TABLET BY MOUTH 3 TIMES DAILY AS NEEDED WITH FOOD OR MILK (Patient taking differently: Take 600 mg by mouth daily as needed for headache.)   [DISCONTINUED] Metoprolol Succinate (KAPSPARGO SPRINKLE) 25 MG CS24 Take 25 mg by mouth daily.   [DISCONTINUED] metoprolol succinate (TOPROL-XL) 25 MG 24 hr tablet 1 tablet Orally Once a day 90 days   [DISCONTINUED] propranolol (INDERAL) 20 MG tablet Take 20 mg by mouth 2 (two) times daily. For 30 day.     Allergies:    Azithromycin, Ethinyl estradiol, Macrobid [nitrofurantoin monohyd macro], Penicillins, and Sertraline   Social History:   Social History   Tobacco Use   Smoking status: Former    Packs/day: 0.15    Years: 0.50    Total pack years: 0.08    Types: Cigarettes    Quit date: 02/25/2011    Years since quitting: 11.5   Smokeless tobacco: Never  Vaping Use   Vaping Use: Never used  Substance Use Topics   Alcohol use: No   Drug use: No     Family Hx: Family History  Problem Relation Age of Onset   Healthy Mother    Healthy Father    Hypertension Sister    Colon cancer Paternal Grandfather    Esophageal cancer Maternal Uncle    Stomach cancer Neg Hx    Liver disease Neg Hx      Review of Systems:   Please see the history of present illness.    All other systems reviewed and are negative.     EKGs/Labs/Other Test Reviewed:    EKG:  EKG performed 2013 that I personally reviewed demonstrates sinus rhythm with PACs; EKG performed today that I personally reviewed demonstrates normal sinus rhythm.  Prior CV studies: None available  Other studies Reviewed: Review of the additional studies/records demonstrates: No imaging studies available demonstrating aortic atherosclerosis or coronary artery calcification  Recent Labs: 07/01/2022:  Hemoglobin 13.3; Platelets 360   Recent Lipid Panel No results found for: "CHOL", "TRIG", "HDL", "LDLCALC", "LDLDIRECT"  Risk Assessment/Calculations:                Physical Exam:    VS:  BP 112/70   Pulse (!) 107   Ht '5\' 5"'$  (1.651 m)   Wt 179 lb 12.8 oz (81.6 kg)   SpO2 98%   BMI 29.92 kg/m    Wt Readings from Last 3 Encounters:  09/09/22 179 lb 12.8 oz (81.6 kg)  07/16/22 168 lb 6.4 oz (76.4 kg)  07/01/22 168 lb 6.4 oz (76.4 kg)    GENERAL:  No apparent distress, AOx3 HEENT:  No carotid bruits, +2 carotid impulses, no scleral icterus  CAR: RRR no murmurs, gallops, rubs, or thrills RES:  Clear to auscultation bilaterally ABD:  Soft, nontender, nondistended, positive bowel sounds x 4 VASC:  +2 radial pulses, +2 carotid pulses, palpable pedal pulses NEURO:  CN 2-12 grossly intact; motor and sensory grossly intact PSYCH:  No active depression or anxiety EXT:  No edema, ecchymosis, or cyanosis  Signed, Early Osmond, MD  09/09/2022 9:13 AM    [facility] [address], [place], [place]  [address] Phone: [phone]; Fax: [phone]   Note:  This document was prepared using Dragon voice recognition software and may include unintentional dictation errors.

## 2022-09-09 ENCOUNTER — Ambulatory Visit: Payer: No Typology Code available for payment source | Attending: Internal Medicine | Admitting: Internal Medicine

## 2022-09-09 ENCOUNTER — Encounter: Payer: Self-pay | Admitting: Internal Medicine

## 2022-09-09 ENCOUNTER — Ambulatory Visit: Payer: No Typology Code available for payment source | Attending: Internal Medicine

## 2022-09-09 VITALS — BP 112/70 | HR 107 | Ht 65.0 in | Wt 179.8 lb

## 2022-09-09 DIAGNOSIS — R002 Palpitations: Secondary | ICD-10-CM

## 2022-09-09 NOTE — Progress Notes (Unsigned)
Enrolled patient for a 7 day Zio XT monitor to be mailed to patients home.  

## 2022-09-09 NOTE — Patient Instructions (Signed)
Medication Instructions:  Your physician has recommended you make the following change in your medication:  1-STOP Metoprolol  *If you need a refill on your cardiac medications before your next appointment, please call your pharmacy*  Lab Work: Your physician recommends that you have lab work today- Reflex TSH, CMP, CBC  If you have labs (blood work) drawn today and your tests are completely normal, you will receive your results only by: [place] (if you have MyChart) OR A paper copy in the mail If you have any lab test that is abnormal or we need to change your treatment, we will call you to review the results.  Testing/Procedures: Your physician has requested that you have an echocardiogram. Echocardiography is a painless test that uses sound waves to create images of your heart. It provides your doctor with information about the size and shape of your heart and how well your heart's chambers and valves are working. This procedure takes approximately one hour. There are no restrictions for this procedure. Please do NOT wear cologne, perfume, aftershave, or lotions (deodorant is allowed). Please arrive 15 minutes prior to your appointment time.  Your physician has recommended that you wear a 7 day monitor. Monitors are medical devices that record the heart's electrical activity. Doctors most often [place] these monitors to diagnose arrhythmias. Arrhythmias are problems with the speed or rhythm of the heartbeat. The monitor is a small, portable device. You can wear one while you do your normal daily activities. This is usually used to diagnose what is causing palpitations/syncope (passing out).  Follow-Up: At [facility], you and your health needs are our priority.  As part of our continuing mission to provide you with exceptional heart care, we have created designated Provider Care Teams.  These Care Teams include your primary Cardiologist (physician) and Advanced Practice  Providers (APPs -  Physician Assistants and Nurse Practitioners) who all work together to provide you with the care you need, when you need it.  We recommend signing up for the patient portal called "MyChart".  Sign up information is provided on this After Visit Summary.  MyChart is used to connect with patients for Virtual Visits (Telemedicine).  Patients are able to view lab/test results, encounter notes, upcoming appointments, etc.  Non-urgent messages can be sent to your provider as well.   To learn more about what you can do with MyChart, go to [email or web address].    Your next appointment:   3 month(s)  The format for your next appointment:   In Person  Provider:   Early Osmond, MD     Other Instructions Bryn Gulling- Long Term Monitor Instructions  Your physician has requested you wear a ZIO patch monitor for 14 days.  This is a single patch monitor. [employer] supplies one patch monitor per enrollment. Additional stickers are not available. Please do not apply patch if you will be having a Nuclear Stress Test,  Echocardiogram, Cardiac CT, MRI, or Chest Xray during the period you would be wearing the  monitor. The patch cannot be worn during these tests. You cannot remove and re-apply the  ZIO XT patch monitor.  Your ZIO patch monitor will be mailed 3 day USPS to your address on file. It may take 3-5 days  to receive your monitor after you have been enrolled.  Once you have received your monitor, please review the enclosed instructions. Your monitor  has already been registered assigning a specific monitor serial # to you.  Billing and Patient  Assistance Program Information  We have supplied [employer] with any of your insurance information on file for billing purposes. [employer] offers a sliding scale Patient Assistance Program for patients that do not have  insurance, or whose insurance does not completely cover the cost of the ZIO monitor.  You must apply for the Patient  Assistance Program to qualify for this discounted rate.  To apply, please call [employer] at [phone], select option 4, select option 2, ask to apply for  Patient Assistance Program. Theodore Demark will ask your household income, and how many people  are in your household. They will quote your out-of-pocket cost based on that information.  [employer] will also be able to set up a 31-month interest-free payment plan if needed.  Applying the monitor   Shave hair from upper left chest.  Hold abrader disc by orange tab. Rub abrader in 40 strokes over the upper left chest as  indicated in your monitor instructions.  Clean area with 4 enclosed alcohol pads. Let dry.  Apply patch as indicated in monitor instructions. Patch will be placed under collarbone on left  side of chest with arrow pointing upward.  Rub patch adhesive wings for 2 minutes. Remove white label marked "1". Remove the white  label marked "2". Rub patch adhesive wings for 2 additional minutes.  While looking in a mirror, press and release button in center of patch. A small green light will  flash 3-4 times. This will be your only indicator that the monitor has been turned on.  Do not shower for the first 24 hours. You may shower after the first 24 hours.  Press the button if you feel a symptom. You will hear a small click. Record Date, Time and  Symptom in the Patient Logbook.  When you are ready to remove the patch, follow instructions on the last 2 pages of Patient  Logbook. Stick patch monitor onto the last page of Patient Logbook.  Place Patient Logbook in the blue and white box. Use locking tab on box and tape box closed  securely. The blue and white box has prepaid postage on it. Please place it in the mailbox as  soon as possible. Your physician should have your test results approximately 7 days after the  monitor [facility]  Call [employer] [phone] you have questions  regarding  your ZIO XT patch monitor. Call them immediately if you see an orange light blinking on your  monitor.  If your monitor falls off in less than 4 days, contact our Monitor department at [phone]  If your monitor becomes loose or falls off after 4 days call [employer] at [phone]  suggestions on securing your monitor   Important Information About Sugar

## 2022-09-10 LAB — COMPREHENSIVE METABOLIC PANEL
ALT: 7 IU/L (ref 0–32)
AST: 13 IU/L (ref 0–40)
Albumin/Globulin Ratio: 2.7 — ABNORMAL HIGH (ref 1.2–2.2)
Albumin: 4.6 g/dL (ref 3.9–4.9)
Alkaline Phosphatase: 60 IU/L (ref 44–121)
BUN/Creatinine Ratio: 17 (ref 9–23)
BUN: 11 mg/dL (ref 6–20)
Bilirubin Total: 0.3 mg/dL (ref 0.0–1.2)
CO2: 18 mmol/L — ABNORMAL LOW (ref 20–29)
Calcium: 9.3 mg/dL (ref 8.7–10.2)
Chloride: 106 mmol/L (ref 96–106)
Creatinine, Ser: 0.66 mg/dL (ref 0.57–1.00)
Globulin, Total: 1.7 g/dL (ref 1.5–4.5)
Glucose: 93 mg/dL (ref 70–99)
Potassium: 4.4 mmol/L (ref 3.5–5.2)
Sodium: 138 mmol/L (ref 134–144)
Total Protein: 6.3 g/dL (ref 6.0–8.5)
eGFR: 118 mL/min/{1.73_m2} (ref >59)

## 2022-09-10 LAB — CBC WITH DIFFERENTIAL/PLATELET
Basophils Absolute: 0.1 10*3/uL (ref 0.0–0.2)
Basos: 1 %
EOS (ABSOLUTE): 0.3 10*3/uL (ref 0.0–0.4)
Eos: 3 %
Hematocrit: 41.2 % (ref 34.0–46.6)
Hemoglobin: 13.7 g/dL (ref 11.1–15.9)
Immature Grans (Abs): 0 10*3/uL (ref 0.0–0.1)
Immature Granulocytes: 0 %
Lymphocytes Absolute: 2.7 10*3/uL (ref 0.7–3.1)
Lymphs: 33 %
MCH: 29.9 pg (ref 26.6–33.0)
MCHC: 33.3 g/dL (ref 31.5–35.7)
MCV: 90 fL (ref 79–97)
Monocytes Absolute: 0.5 10*3/uL (ref 0.1–0.9)
Monocytes: 7 %
Neutrophils Absolute: 4.6 10*3/uL (ref 1.4–7.0)
Neutrophils: 56 %
Platelets: 389 10*3/uL (ref 150–450)
RBC: 4.58 x10E6/uL (ref 3.77–5.28)
RDW: 12.4 % (ref 11.7–15.4)
WBC: 8.3 10*3/uL (ref 3.4–10.8)

## 2022-09-10 LAB — TSH RFX ON ABNORMAL TO FREE T4: TSH: 2.96 u[IU]/mL (ref 0.450–4.500)

## 2022-09-15 DIAGNOSIS — R002 Palpitations: Secondary | ICD-10-CM | POA: Diagnosis not present

## 2022-09-17 ENCOUNTER — Other Ambulatory Visit (HOSPITAL_COMMUNITY): Payer: Self-pay

## 2022-09-20 ENCOUNTER — Other Ambulatory Visit (HOSPITAL_COMMUNITY): Payer: Self-pay

## 2022-09-20 MED ORDER — WEGOVY 1 MG/0.5ML ~~LOC~~ SOAJ
1.0000 mg | SUBCUTANEOUS | 0 refills | Status: DC
  Filled 2022-09-20 – 2022-09-23 (×2): qty 2, 28d supply, fill #0

## 2022-09-21 ENCOUNTER — Other Ambulatory Visit (HOSPITAL_COMMUNITY): Payer: Self-pay

## 2022-09-21 DIAGNOSIS — M79642 Pain in left hand: Secondary | ICD-10-CM | POA: Diagnosis not present

## 2022-09-23 ENCOUNTER — Other Ambulatory Visit (HOSPITAL_COMMUNITY): Payer: Self-pay

## 2022-10-05 ENCOUNTER — Other Ambulatory Visit (HOSPITAL_COMMUNITY): Payer: 59

## 2022-10-11 ENCOUNTER — Encounter: Payer: Self-pay | Admitting: Internal Medicine

## 2022-10-19 ENCOUNTER — Other Ambulatory Visit (HOSPITAL_COMMUNITY): Payer: 59

## 2022-10-27 ENCOUNTER — Other Ambulatory Visit (HOSPITAL_COMMUNITY): Payer: Self-pay

## 2022-10-28 ENCOUNTER — Other Ambulatory Visit: Payer: Self-pay

## 2022-10-28 ENCOUNTER — Other Ambulatory Visit (HOSPITAL_COMMUNITY): Payer: Self-pay

## 2022-10-28 MED ORDER — WEGOVY 1 MG/0.5ML ~~LOC~~ SOAJ
1.0000 mg | SUBCUTANEOUS | 0 refills | Status: DC
  Filled 2022-10-28: qty 2, 28d supply, fill #0

## 2022-11-10 ENCOUNTER — Other Ambulatory Visit (HOSPITAL_COMMUNITY): Payer: Self-pay

## 2022-11-10 MED ORDER — METRONIDAZOLE 0.75 % VA GEL
1.0000 | Freq: Every day | VAGINAL | 0 refills | Status: DC
  Filled 2022-11-10: qty 70, 5d supply, fill #0

## 2022-11-11 ENCOUNTER — Other Ambulatory Visit (HOSPITAL_COMMUNITY): Payer: Self-pay

## 2022-11-11 ENCOUNTER — Other Ambulatory Visit: Payer: Self-pay

## 2022-11-12 ENCOUNTER — Other Ambulatory Visit (HOSPITAL_COMMUNITY): Payer: Self-pay

## 2022-11-12 MED ORDER — WEGOVY 1 MG/0.5ML ~~LOC~~ SOAJ
1.0000 mg | SUBCUTANEOUS | 0 refills | Status: DC
  Filled 2022-11-12 – 2022-11-18 (×2): qty 2, 28d supply, fill #0

## 2022-11-18 ENCOUNTER — Other Ambulatory Visit (HOSPITAL_COMMUNITY): Payer: Self-pay

## 2022-11-18 ENCOUNTER — Other Ambulatory Visit: Payer: Self-pay

## 2022-11-19 ENCOUNTER — Other Ambulatory Visit (HOSPITAL_COMMUNITY): Payer: Self-pay

## 2022-11-19 ENCOUNTER — Other Ambulatory Visit: Payer: Self-pay

## 2022-11-19 MED ORDER — ALPRAZOLAM 0.5 MG PO TABS
0.5000 mg | ORAL_TABLET | Freq: Every day | ORAL | 0 refills | Status: DC | PRN
  Filled 2022-11-19: qty 15, 30d supply, fill #0

## 2022-11-25 ENCOUNTER — Other Ambulatory Visit (HOSPITAL_COMMUNITY): Payer: Self-pay

## 2022-11-30 ENCOUNTER — Ambulatory Visit: Payer: 59 | Admitting: Internal Medicine

## 2022-12-12 ENCOUNTER — Telehealth: Payer: 59 | Admitting: Nurse Practitioner

## 2022-12-12 DIAGNOSIS — J029 Acute pharyngitis, unspecified: Secondary | ICD-10-CM

## 2022-12-12 NOTE — Progress Notes (Signed)
E-Visit for Sore Throat  Please feel free to reach back out to [place] if your symptoms persist over the next 5-7 days or if you develop a high fever. The symptoms you listed below can also occur with viral strep which does not require an antibiotic at this time.  I recommend continuous warm salt water gargles and liquid tylenol and or motrin for pain.  We are sorry that you are not feeling well.  Here is how we plan to help!  Your symptoms indicate a likely viral infection (Pharyngitis).   Pharyngitis is inflammation in the back of the throat which can cause a sore throat, scratchiness and sometimes difficulty swallowing.   Pharyngitis is typically caused by a respiratory virus and will just run its course.  Please keep in mind that your symptoms could last up to 10 days.  For throat pain, we recommend over the counter oral pain relief medications such as acetaminophen or aspirin, or anti-inflammatory medications such as ibuprofen or naproxen sodium.  Topical treatments such as oral throat lozenges or sprays may be used as needed.  Avoid close contact with loved ones, especially the very young and elderly.  Remember to wash your hands thoroughly throughout the day as this is the number one way to prevent the spread of infection and wipe down door knobs and counters with disinfectant.  After careful review of your answers, I would not recommend an antibiotic for your condition.  Antibiotics should not be used to treat conditions that we suspect are caused by viruses like the virus that causes the common cold or flu. However, some people can have Strep with atypical symptoms. You may need formal testing in clinic or office to confirm if your symptoms continue or worsen.  Providers prescribe antibiotics to treat infections caused by bacteria. Antibiotics are very powerful in treating bacterial infections when they are used properly.  To maintain their effectiveness, they should be used only when necessary.   Overuse of antibiotics has resulted in the development of super bugs that are resistant to treatment!    Home Care: Only take medications as instructed by your medical team. Do not drink alcohol while taking these medications. A steam or ultrasonic humidifier can help congestion.  You can place a towel over your head and breathe in the steam from hot water coming from a faucet. Avoid close contacts especially the very young and the elderly. Cover your mouth when you cough or sneeze. Always remember to wash your hands.  Get Help Right Away If: You develop worsening fever or throat pain. You develop a severe head ache or visual changes. Your symptoms persist after you have completed your treatment plan.  Make sure you Understand these instructions. Will watch your condition. Will get help right away if you are not doing well or get worse.   Thank you for choosing an e-visit.  Your e-visit answers were reviewed by a board certified advanced clinical practitioner to complete your personal care plan. Depending upon the condition, your plan could have included both over the counter or prescription medications.  Please review your pharmacy choice. Make sure the pharmacy is open so you can pick up prescription now. If there is a problem, you may contact your provider through [employer] and have the prescription routed to another pharmacy.  Your safety is important to [place]. If you have drug allergies check your prescription carefully.   For the next 24 hours you can use MyChart to ask questions about today's  visit, request a non-urgent call back, or ask for a work or school excuse. You will get an email in the next two days asking about your experience. I hope that your e-visit has been valuable and will speed your recovery.

## 2022-12-12 NOTE — Progress Notes (Signed)
I have spent 5 minutes in review of e-visit questionnaire, review and updating patient chart, medical decision making and response to patient.  ° °Taiga Lupinacci W Dnya Hickle, NP ° °  °

## 2022-12-21 ENCOUNTER — Other Ambulatory Visit (HOSPITAL_COMMUNITY): Payer: Self-pay

## 2022-12-21 ENCOUNTER — Other Ambulatory Visit: Payer: Self-pay | Admitting: Adult Health

## 2022-12-22 ENCOUNTER — Other Ambulatory Visit (HOSPITAL_COMMUNITY): Payer: Self-pay

## 2022-12-22 MED ORDER — ALPRAZOLAM 0.5 MG PO TABS
0.5000 mg | ORAL_TABLET | ORAL | 0 refills | Status: DC | PRN
  Filled 2022-12-22: qty 15, 30d supply, fill #0

## 2022-12-23 ENCOUNTER — Encounter: Payer: Self-pay | Admitting: Adult Health

## 2022-12-23 ENCOUNTER — Other Ambulatory Visit (HOSPITAL_COMMUNITY): Payer: Self-pay

## 2022-12-23 DIAGNOSIS — D485 Neoplasm of uncertain behavior of skin: Secondary | ICD-10-CM | POA: Diagnosis not present

## 2022-12-23 DIAGNOSIS — L821 Other seborrheic keratosis: Secondary | ICD-10-CM | POA: Diagnosis not present

## 2022-12-23 DIAGNOSIS — Z86018 Personal history of other benign neoplasm: Secondary | ICD-10-CM | POA: Diagnosis not present

## 2022-12-23 DIAGNOSIS — D225 Melanocytic nevi of trunk: Secondary | ICD-10-CM | POA: Diagnosis not present

## 2022-12-23 DIAGNOSIS — L578 Other skin changes due to chronic exposure to nonionizing radiation: Secondary | ICD-10-CM | POA: Diagnosis not present

## 2022-12-27 ENCOUNTER — Other Ambulatory Visit (HOSPITAL_COMMUNITY): Payer: Self-pay

## 2022-12-27 ENCOUNTER — Other Ambulatory Visit: Payer: Self-pay | Admitting: *Deleted

## 2022-12-27 MED ORDER — UBRELVY 100 MG PO TABS
ORAL_TABLET | ORAL | 3 refills | Status: AC
  Filled 2022-12-27: qty 10, 10d supply, fill #0
  Filled 2023-03-17: qty 10, 30d supply, fill #1
  Filled 2023-04-11: qty 10, 30d supply, fill #2

## 2022-12-29 NOTE — Telephone Encounter (Signed)
Completed continuation PA for Ubrelvy on CMM. Key: [id number]. Awaiting determination from [employer].

## 2022-12-30 ENCOUNTER — Other Ambulatory Visit (HOSPITAL_COMMUNITY): Payer: Self-pay

## 2022-12-30 MED ORDER — WEGOVY 1.7 MG/0.75ML ~~LOC~~ SOAJ
1.7000 mg | SUBCUTANEOUS | 0 refills | Status: DC
  Filled 2022-12-30: qty 3, 28d supply, fill #0

## 2023-01-18 ENCOUNTER — Other Ambulatory Visit (HOSPITAL_COMMUNITY): Payer: Self-pay

## 2023-01-18 DIAGNOSIS — K219 Gastro-esophageal reflux disease without esophagitis: Secondary | ICD-10-CM | POA: Diagnosis not present

## 2023-01-18 DIAGNOSIS — J329 Chronic sinusitis, unspecified: Secondary | ICD-10-CM | POA: Diagnosis not present

## 2023-01-18 DIAGNOSIS — F419 Anxiety disorder, unspecified: Secondary | ICD-10-CM | POA: Diagnosis not present

## 2023-01-18 DIAGNOSIS — R002 Palpitations: Secondary | ICD-10-CM | POA: Diagnosis not present

## 2023-01-18 DIAGNOSIS — N3010 Interstitial cystitis (chronic) without hematuria: Secondary | ICD-10-CM | POA: Diagnosis not present

## 2023-01-18 DIAGNOSIS — G43009 Migraine without aura, not intractable, without status migrainosus: Secondary | ICD-10-CM | POA: Diagnosis not present

## 2023-01-18 DIAGNOSIS — E559 Vitamin D deficiency, unspecified: Secondary | ICD-10-CM | POA: Diagnosis not present

## 2023-01-18 DIAGNOSIS — E039 Hypothyroidism, unspecified: Secondary | ICD-10-CM | POA: Diagnosis not present

## 2023-01-18 MED ORDER — DOXYCYCLINE HYCLATE 100 MG PO CAPS
200.0000 mg | ORAL_CAPSULE | Freq: Every day | ORAL | 0 refills | Status: DC
  Filled 2023-01-18: qty 14, 7d supply, fill #0

## 2023-01-20 DIAGNOSIS — Z01411 Encounter for gynecological examination (general) (routine) with abnormal findings: Secondary | ICD-10-CM | POA: Diagnosis not present

## 2023-01-20 DIAGNOSIS — Z113 Encounter for screening for infections with a predominantly sexual mode of transmission: Secondary | ICD-10-CM | POA: Diagnosis not present

## 2023-01-20 DIAGNOSIS — Z01419 Encounter for gynecological examination (general) (routine) without abnormal findings: Secondary | ICD-10-CM | POA: Diagnosis not present

## 2023-01-20 DIAGNOSIS — Z124 Encounter for screening for malignant neoplasm of cervix: Secondary | ICD-10-CM | POA: Diagnosis not present

## 2023-01-21 ENCOUNTER — Other Ambulatory Visit: Payer: Self-pay | Admitting: Obstetrics and Gynecology

## 2023-01-21 DIAGNOSIS — N644 Mastodynia: Secondary | ICD-10-CM

## 2023-01-21 DIAGNOSIS — N6310 Unspecified lump in the right breast, unspecified quadrant: Secondary | ICD-10-CM

## 2023-02-17 ENCOUNTER — Ambulatory Visit
Admission: RE | Admit: 2023-02-17 | Discharge: 2023-02-17 | Disposition: A | Discharge status: Home / Self Care | Payer: 59 | Source: Ambulatory Visit | Attending: Obstetrics and Gynecology | Admitting: Obstetrics and Gynecology

## 2023-02-17 DIAGNOSIS — N644 Mastodynia: Secondary | ICD-10-CM | POA: Diagnosis not present

## 2023-02-17 DIAGNOSIS — N6001 Solitary cyst of right breast: Secondary | ICD-10-CM | POA: Diagnosis not present

## 2023-02-17 DIAGNOSIS — N63 Unspecified lump in unspecified breast: Secondary | ICD-10-CM | POA: Diagnosis not present

## 2023-02-17 DIAGNOSIS — N6310 Unspecified lump in the right breast, unspecified quadrant: Secondary | ICD-10-CM

## 2023-02-17 DIAGNOSIS — N6002 Solitary cyst of left breast: Secondary | ICD-10-CM | POA: Diagnosis not present

## 2023-03-01 ENCOUNTER — Other Ambulatory Visit (HOSPITAL_COMMUNITY): Payer: Self-pay

## 2023-03-01 MED ORDER — ALPRAZOLAM 0.5 MG PO TABS
0.5000 mg | ORAL_TABLET | Freq: Every day | ORAL | 0 refills | Status: AC | PRN
  Filled 2023-03-01: qty 15, 30d supply, fill #0

## 2023-03-18 ENCOUNTER — Other Ambulatory Visit: Payer: Self-pay

## 2023-03-18 ENCOUNTER — Other Ambulatory Visit (HOSPITAL_COMMUNITY): Payer: Self-pay

## 2023-04-12 ENCOUNTER — Other Ambulatory Visit (HOSPITAL_COMMUNITY): Payer: Self-pay

## 2023-04-15 ENCOUNTER — Other Ambulatory Visit (HOSPITAL_COMMUNITY): Payer: Self-pay

## 2023-04-15 MED ORDER — IBUPROFEN 600 MG PO TABS
600.0000 mg | ORAL_TABLET | Freq: Three times a day (TID) | ORAL | 1 refills | Status: AC | PRN
  Filled 2023-04-15: qty 90, 30d supply, fill #0
  Filled 2023-08-10: qty 90, 30d supply, fill #1

## 2023-05-18 ENCOUNTER — Other Ambulatory Visit (HOSPITAL_COMMUNITY): Payer: Self-pay

## 2023-05-18 ENCOUNTER — Other Ambulatory Visit: Payer: Self-pay

## 2023-05-18 MED ORDER — ALPRAZOLAM 0.5 MG PO TABS
0.5000 mg | ORAL_TABLET | Freq: Every day | ORAL | 0 refills | Status: DC | PRN
  Filled 2023-05-18: qty 30, 30d supply, fill #0

## 2023-06-15 DIAGNOSIS — M7989 Other specified soft tissue disorders: Secondary | ICD-10-CM | POA: Diagnosis not present

## 2023-06-17 ENCOUNTER — Other Ambulatory Visit: Payer: Self-pay | Admitting: Physician Assistant

## 2023-06-17 DIAGNOSIS — M7989 Other specified soft tissue disorders: Secondary | ICD-10-CM

## 2023-06-21 ENCOUNTER — Other Ambulatory Visit (HOSPITAL_COMMUNITY): Payer: Self-pay

## 2023-06-21 ENCOUNTER — Other Ambulatory Visit: Payer: Self-pay

## 2023-06-21 MED ORDER — UBRELVY 100 MG PO TABS
100.0000 mg | ORAL_TABLET | ORAL | 1 refills | Status: DC | PRN
  Filled 2023-06-21: qty 10, 30d supply, fill #0
  Filled 2023-07-21: qty 10, 30d supply, fill #1
  Filled 2023-08-10 – 2023-08-24 (×2): qty 10, 30d supply, fill #2
  Filled 2023-11-07: qty 10, 30d supply, fill #3

## 2023-07-07 ENCOUNTER — Other Ambulatory Visit (HOSPITAL_COMMUNITY): Payer: Self-pay

## 2023-07-07 ENCOUNTER — Ambulatory Visit: Payer: 59 | Admitting: Podiatry

## 2023-07-07 ENCOUNTER — Ambulatory Visit (INDEPENDENT_AMBULATORY_CARE_PROVIDER_SITE_OTHER): Payer: 59

## 2023-07-07 ENCOUNTER — Encounter: Payer: Self-pay | Admitting: Podiatry

## 2023-07-07 DIAGNOSIS — M205X1 Other deformities of toe(s) (acquired), right foot: Secondary | ICD-10-CM

## 2023-07-07 DIAGNOSIS — M722 Plantar fascial fibromatosis: Secondary | ICD-10-CM

## 2023-07-07 DIAGNOSIS — M778 Other enthesopathies, not elsewhere classified: Secondary | ICD-10-CM | POA: Diagnosis not present

## 2023-07-07 MED ORDER — DICLOFENAC SODIUM 75 MG PO TBEC
75.0000 mg | DELAYED_RELEASE_TABLET | Freq: Two times a day (BID) | ORAL | 2 refills | Status: DC
  Filled 2023-07-07 (×2): qty 50, 25d supply, fill #0
  Filled 2023-08-03: qty 50, 25d supply, fill #1
  Filled 2023-08-24: qty 50, 25d supply, fill #2

## 2023-07-07 MED ORDER — TRIAMCINOLONE ACETONIDE 10 MG/ML IJ SUSP
10.0000 mg | Freq: Once | INTRAMUSCULAR | Status: AC
Start: 2023-07-07 — End: 2023-07-07
  Administered 2023-07-07: 10 mg via INTRA_ARTICULAR

## 2023-07-07 NOTE — Progress Notes (Signed)
Subjective:   Patient ID: [name], female   DOB: 37 y.o.   MRN: [id number]   HPI Patient presents long-term history of pain in the right heel that has been going on more intensely for the last 6 months and have been somewhat bothersome off and on prior to that.  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       Objective:  Physical Exam Vitals and nursing note reviewed.  Constitutional:      Appearance: She is well-developed.  Pulmonary:     Effort: Pulmonary effort is normal.  Musculoskeletal:        General: Normal range of motion.  Skin:    General: Skin is warm.  Neurological:     Mental Status: She is alert.     Neurovascular status was found to be intact muscle strength adequate range of motion within normal limits.  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  Patient's pain is very distributed not only in the heel but also into the arch     Plan:  H&P x-ray reviewed and I went ahead today I did do steroid injection plantar fascia at insertion and somewhat into the mid arch 3 mg Kenalog 5 mg Xylocaine and applied air fracture walker to take all stress off the plantar foot due to the wide distribution of pain.  Discussed hallux limitus long-term I would recommend orthotics and would probably make them in a variation that would try to address the first MPJ pathology  X-rays indicate small plantar spur formation no indication stress fracture arthritis moderate elevation first metatarsal segment

## 2023-07-07 NOTE — Patient Instructions (Signed)

## 2023-07-22 ENCOUNTER — Other Ambulatory Visit: Payer: Self-pay

## 2023-07-28 ENCOUNTER — Ambulatory Visit: Payer: 59 | Admitting: Podiatry

## 2023-07-28 ENCOUNTER — Encounter: Payer: Self-pay | Admitting: Podiatry

## 2023-07-28 DIAGNOSIS — M205X1 Other deformities of toe(s) (acquired), right foot: Secondary | ICD-10-CM | POA: Diagnosis not present

## 2023-07-28 DIAGNOSIS — M722 Plantar fascial fibromatosis: Secondary | ICD-10-CM

## 2023-07-28 NOTE — Progress Notes (Signed)
Subjective:   Patient ID: [name], female   DOB: 37 y.o.   MRN: [id number]   HPI Patient presents stating the heel is feeling quite a bit better but she has been in the boot full-time for the last 3 weeks.  States it still gets sore at times and her third nail left she wanted checked   ROS      Objective:  Physical Exam  Neurovascular status found to be intact with the patient's right plantar fascia quite a bit improved upon pressure with mild soreness still noted.  Also does have functional hallux limitus and does have history of foot structural issues with grandmother.  Patient also has discoloration third nailbed left nonpainful been there 10 years     Assessment:  Plantar fasciitis right improving but still present with a nail that is damaged third left that is most likely trauma over fungus with functional hallux limitus deformity bilateral     Plan:  H&P reviewed all conditions.  At this point I have recommended weaning off the boot over the next 2 to 3 weeks and long-term orthotics to offload weight from the heel which were casted today.  Will also put reverse Morton's extensions to try to help with functional hallux limitus and do not recommend any treatment for nail as it has been stable for 10 years.  Patient will see Rosann Auerbach when orthotics return and see me as symptoms indicate

## 2023-07-29 ENCOUNTER — Encounter: Payer: Self-pay | Admitting: Podiatry

## 2023-07-29 ENCOUNTER — Other Ambulatory Visit: Payer: Self-pay | Admitting: Podiatry

## 2023-08-03 ENCOUNTER — Other Ambulatory Visit: Payer: Self-pay | Admitting: Podiatry

## 2023-08-03 ENCOUNTER — Other Ambulatory Visit: Payer: Self-pay

## 2023-08-10 ENCOUNTER — Other Ambulatory Visit (HOSPITAL_COMMUNITY): Payer: Self-pay

## 2023-08-10 ENCOUNTER — Other Ambulatory Visit: Payer: Self-pay

## 2023-08-10 MED ORDER — ALPRAZOLAM 0.5 MG PO TABS
0.5000 mg | ORAL_TABLET | Freq: Every day | ORAL | 0 refills | Status: DC | PRN
  Filled 2023-08-10: qty 30, 30d supply, fill #0

## 2023-08-24 ENCOUNTER — Ambulatory Visit: Payer: 59

## 2023-08-24 ENCOUNTER — Other Ambulatory Visit (HOSPITAL_COMMUNITY): Payer: Self-pay

## 2023-08-24 NOTE — Progress Notes (Signed)
Patient presents today to pick up custom molded foot orthotics, diagnosed with plantar fasciitis by Dr. Charlsie Merles .   Orthotics were dispensed and fit was satisfactory. Reviewed instructions for break-in and wear. Written instructions given to patient.  Patient will follow up as needed.  Addison Bailey CPed, CFo, CFm

## 2023-09-23 ENCOUNTER — Other Ambulatory Visit: Payer: Self-pay | Admitting: Podiatry

## 2023-09-26 MED ORDER — DICLOFENAC SODIUM 75 MG PO TBEC
75.0000 mg | DELAYED_RELEASE_TABLET | Freq: Two times a day (BID) | ORAL | 2 refills | Status: AC
  Filled 2023-09-26: qty 50, 25d supply, fill #0

## 2023-09-27 ENCOUNTER — Other Ambulatory Visit: Payer: Self-pay

## 2023-09-30 DIAGNOSIS — E559 Vitamin D deficiency, unspecified: Secondary | ICD-10-CM | POA: Diagnosis not present

## 2023-09-30 DIAGNOSIS — Z8639 Personal history of other endocrine, nutritional and metabolic disease: Secondary | ICD-10-CM | POA: Diagnosis not present

## 2023-09-30 DIAGNOSIS — Z0000 Encounter for general adult medical examination without abnormal findings: Secondary | ICD-10-CM | POA: Diagnosis not present

## 2023-09-30 DIAGNOSIS — E039 Hypothyroidism, unspecified: Secondary | ICD-10-CM | POA: Diagnosis not present

## 2023-09-30 DIAGNOSIS — F419 Anxiety disorder, unspecified: Secondary | ICD-10-CM | POA: Diagnosis not present

## 2023-09-30 DIAGNOSIS — N3010 Interstitial cystitis (chronic) without hematuria: Secondary | ICD-10-CM | POA: Diagnosis not present

## 2023-09-30 DIAGNOSIS — K219 Gastro-esophageal reflux disease without esophagitis: Secondary | ICD-10-CM | POA: Diagnosis not present

## 2023-09-30 DIAGNOSIS — G43009 Migraine without aura, not intractable, without status migrainosus: Secondary | ICD-10-CM | POA: Diagnosis not present

## 2023-09-30 DIAGNOSIS — Z1159 Encounter for screening for other viral diseases: Secondary | ICD-10-CM | POA: Diagnosis not present

## 2023-10-26 DIAGNOSIS — H5213 Myopia, bilateral: Secondary | ICD-10-CM | POA: Diagnosis not present

## 2023-11-05 ENCOUNTER — Other Ambulatory Visit (HOSPITAL_COMMUNITY): Payer: Self-pay

## 2023-11-07 ENCOUNTER — Other Ambulatory Visit: Payer: Self-pay

## 2023-11-07 ENCOUNTER — Other Ambulatory Visit (HOSPITAL_COMMUNITY): Payer: Self-pay

## 2023-11-07 MED ORDER — ALPRAZOLAM 0.5 MG PO TABS
0.5000 mg | ORAL_TABLET | Freq: Every day | ORAL | 0 refills | Status: DC | PRN
  Filled 2023-11-07 – 2023-11-09 (×2): qty 30, 30d supply, fill #0

## 2023-11-08 ENCOUNTER — Other Ambulatory Visit (HOSPITAL_COMMUNITY): Payer: Self-pay

## 2023-11-08 ENCOUNTER — Other Ambulatory Visit: Payer: Self-pay

## 2023-11-09 ENCOUNTER — Other Ambulatory Visit (HOSPITAL_COMMUNITY): Payer: Self-pay

## 2023-11-09 ENCOUNTER — Other Ambulatory Visit: Payer: Self-pay

## 2023-11-23 ENCOUNTER — Other Ambulatory Visit (HOSPITAL_COMMUNITY): Payer: Self-pay

## 2023-12-09 ENCOUNTER — Telehealth: Payer: Self-pay | Admitting: Internal Medicine

## 2023-12-09 NOTE — Telephone Encounter (Signed)
 Good afternoon Dr. Leone Payor,   Patient is wishing to be seen for constipation and rectal bleeding. Wishing to transfer her care over to Dr. Tomasa Rand due to patient preference. Please review and advise on transfer.    Thank you.

## 2023-12-09 NOTE — Telephone Encounter (Signed)
 OK with me.

## 2023-12-21 DIAGNOSIS — L578 Other skin changes due to chronic exposure to nonionizing radiation: Secondary | ICD-10-CM | POA: Diagnosis not present

## 2023-12-21 DIAGNOSIS — Z86018 Personal history of other benign neoplasm: Secondary | ICD-10-CM | POA: Diagnosis not present

## 2023-12-27 ENCOUNTER — Encounter (INDEPENDENT_AMBULATORY_CARE_PROVIDER_SITE_OTHER): Payer: Self-pay | Admitting: *Deleted

## 2024-01-02 ENCOUNTER — Other Ambulatory Visit (HOSPITAL_COMMUNITY): Payer: Self-pay

## 2024-01-03 ENCOUNTER — Telehealth: Payer: Self-pay

## 2024-01-03 ENCOUNTER — Other Ambulatory Visit (HOSPITAL_COMMUNITY): Payer: Self-pay

## 2024-01-03 NOTE — Telephone Encounter (Signed)
 Pharmacy Patient Advocate Encounter   Received notification from CoverMyMeds that prior authorization for Ubrelvy  is due for renewal.   Insurance verification completed.   The patient is insured through [facility].  Action: Patient hasn't been seen in your office in over a year. Plan requires updated chart notes for PA renewal.

## 2024-01-03 NOTE — Telephone Encounter (Signed)
 Ok you can cancel the request. There is no pending appt. Its almost been 2 years since we've seen the patient and this type of request would not have been initiated by the patient or pharmacy.

## 2024-01-11 ENCOUNTER — Other Ambulatory Visit (HOSPITAL_COMMUNITY): Payer: Self-pay

## 2024-01-11 DIAGNOSIS — Z01411 Encounter for gynecological examination (general) (routine) with abnormal findings: Secondary | ICD-10-CM | POA: Diagnosis not present

## 2024-01-11 DIAGNOSIS — Z1331 Encounter for screening for depression: Secondary | ICD-10-CM | POA: Diagnosis not present

## 2024-01-11 DIAGNOSIS — Z124 Encounter for screening for malignant neoplasm of cervix: Secondary | ICD-10-CM | POA: Diagnosis not present

## 2024-01-11 DIAGNOSIS — Z113 Encounter for screening for infections with a predominantly sexual mode of transmission: Secondary | ICD-10-CM | POA: Diagnosis not present

## 2024-01-11 DIAGNOSIS — Z01419 Encounter for gynecological examination (general) (routine) without abnormal findings: Secondary | ICD-10-CM | POA: Diagnosis not present

## 2024-01-11 MED ORDER — UBRELVY 100 MG PO TABS
ORAL_TABLET | ORAL | 1 refills | Status: DC
  Filled 2024-01-11: qty 20, 20d supply, fill #0
  Filled 2024-01-12: qty 10, 30d supply, fill #0
  Filled 2024-02-07: qty 10, 30d supply, fill #1
  Filled 2024-03-08: qty 10, 30d supply, fill #2
  Filled 2024-06-11: qty 10, 30d supply, fill #3

## 2024-01-11 MED ORDER — TRETINOIN 0.05 % EX CREA
TOPICAL_CREAM | CUTANEOUS | 3 refills | Status: AC
  Filled 2024-01-11: qty 45, 30d supply, fill #0

## 2024-01-11 MED ORDER — PHENTERMINE HCL 37.5 MG PO TABS
37.5000 mg | ORAL_TABLET | Freq: Every morning | ORAL | 2 refills | Status: DC
  Filled 2024-01-11: qty 30, 30d supply, fill #0
  Filled 2024-02-06 – 2024-02-09 (×2): qty 30, 30d supply, fill #1
  Filled 2024-03-01 – 2024-03-10 (×3): qty 30, 30d supply, fill #2

## 2024-01-12 ENCOUNTER — Other Ambulatory Visit (INDEPENDENT_AMBULATORY_CARE_PROVIDER_SITE_OTHER): Payer: Self-pay | Admitting: Gastroenterology

## 2024-01-12 ENCOUNTER — Other Ambulatory Visit: Payer: Self-pay

## 2024-01-12 ENCOUNTER — Encounter (HOSPITAL_COMMUNITY): Payer: Self-pay

## 2024-01-12 ENCOUNTER — Encounter (INDEPENDENT_AMBULATORY_CARE_PROVIDER_SITE_OTHER): Payer: Self-pay | Admitting: Gastroenterology

## 2024-01-12 ENCOUNTER — Ambulatory Visit (INDEPENDENT_AMBULATORY_CARE_PROVIDER_SITE_OTHER): Admitting: Gastroenterology

## 2024-01-12 ENCOUNTER — Other Ambulatory Visit (HOSPITAL_COMMUNITY): Payer: Self-pay

## 2024-01-12 ENCOUNTER — Telehealth (INDEPENDENT_AMBULATORY_CARE_PROVIDER_SITE_OTHER): Payer: Self-pay | Admitting: *Deleted

## 2024-01-12 VITALS — BP 110/72 | HR 103 | Temp 98.4°F | Ht 64.0 in | Wt 176.5 lb

## 2024-01-12 DIAGNOSIS — K582 Mixed irritable bowel syndrome: Secondary | ICD-10-CM

## 2024-01-12 DIAGNOSIS — K644 Residual hemorrhoidal skin tags: Secondary | ICD-10-CM | POA: Diagnosis not present

## 2024-01-12 DIAGNOSIS — K625 Hemorrhage of anus and rectum: Secondary | ICD-10-CM | POA: Diagnosis not present

## 2024-01-12 DIAGNOSIS — K649 Unspecified hemorrhoids: Secondary | ICD-10-CM | POA: Insufficient documentation

## 2024-01-12 DIAGNOSIS — R1310 Dysphagia, unspecified: Secondary | ICD-10-CM | POA: Diagnosis not present

## 2024-01-12 DIAGNOSIS — K5900 Constipation, unspecified: Secondary | ICD-10-CM | POA: Diagnosis not present

## 2024-01-12 DIAGNOSIS — K641 Second degree hemorrhoids: Secondary | ICD-10-CM

## 2024-01-12 DIAGNOSIS — K602 Anal fissure, unspecified: Secondary | ICD-10-CM | POA: Insufficient documentation

## 2024-01-12 MED ORDER — LUBIPROSTONE 24 MCG PO CAPS
24.0000 ug | ORAL_CAPSULE | Freq: Two times a day (BID) | ORAL | 3 refills | Status: DC
Start: 2024-01-12 — End: 2024-06-07
  Filled 2024-01-12: qty 60, 30d supply, fill #0
  Filled 2024-02-10: qty 60, 30d supply, fill #1
  Filled 2024-03-10: qty 60, 30d supply, fill #2

## 2024-01-12 MED ORDER — HYDROCORTISONE ACETATE 25 MG RE SUPP
25.0000 mg | Freq: Two times a day (BID) | RECTAL | 0 refills | Status: AC
Start: 2024-01-12 — End: ?
  Filled 2024-01-12: qty 20, 10d supply, fill #0

## 2024-01-12 MED ORDER — ALPRAZOLAM 0.5 MG PO TABS
0.5000 mg | ORAL_TABLET | Freq: Every day | ORAL | 0 refills | Status: AC | PRN
  Filled 2024-01-12: qty 30, 30d supply, fill #0

## 2024-01-12 MED ORDER — LINACLOTIDE 145 MCG PO CAPS
145.0000 ug | ORAL_CAPSULE | Freq: Every day | ORAL | 3 refills | Status: DC
Start: 2024-01-12 — End: 2024-01-12
  Filled 2024-01-12 (×2): qty 90, 90d supply, fill #0

## 2024-01-12 MED ORDER — HYDROCORTISONE ACETATE 25 MG RE SUPP
25.0000 mg | Freq: Two times a day (BID) | RECTAL | 0 refills | Status: DC
Start: 2024-01-12 — End: 2024-01-12
  Filled 2024-01-12: qty 12, 6d supply, fill #0

## 2024-01-12 NOTE — Progress Notes (Signed)
 [name], M.D. Gastroenterology & Hepatology [facility] [address] [place], [place] [address] Primary Care Physician: Benedetto Brady, MD [address] [place] [address]  Referring MD: PCP  Chief Complaint: Constipation and rectal bleeding  History of Present Illness: [name] is a 38 y.o. female with Pmh interstitial cystitis, migraines, who presents for evaluation of constipation and rectal bleeding.  Patient reports that after she had a C-section 4 years ago, she noticed worsening constipation. States her first pregnancy was a vaginal delivery but she did not have too much constipation after it. She reports that after her second delivery, she developed constipation and was having a BM every week and had to strain significantly.  Since then she has been dealing with issues with constipation regularly.  States that 4 months ago she started noticing worsening constipation. She has had some fresh blood every time she wipes, but not mixed with her stool.  States she takes magnesium  citrate every day, as well as Dulcolax 2 times a week and weekly fleet enemas. She has to strain significantly to have a BM and believes this led to a rupture. She states there is a "spot that is very raw int he perianal area and It goes inside the rectum".  She has frequent episodes of cramping in the lower abdomen and has constant sensation of fullness in her abdomen. She feels frequently bloated.  She also reports feeling for 4-5 months she has felt the food "gets caught" in her chest, almost every single time she has a meal that has a meat or eats dry food. No vomiting. Only has heartburn when she swallows and food passes down.  She has been very stressed recently and wonders if this is causing some of her symptoms.  The patient denies having any never, chills, melena, hematemesis,  diarrhea, jaundice, pruritus or weight  loss.  Previous medications for constipation: OTC laxatives/stool softeners , Miralax.  Patient was previously seeing Dr. Willy Harvest for hemorrhoids.  He initially used topical therapy for this, but eventually had persistent symptoms and was referred for surgical management.  Patient had external hemorrhoidectomy x2 in 07/2022 by Dr. Gaylyn Keas. Notably, she has been using diltiazem prescribed by Dr. Willy Harvest for the last 4 months, as she had some leftovers.  Last UJW:JXBJY Last Colonoscopy:never  FHx: Father has some swallowing issues, neg for any gastrointestinal/liver disease, no malignancies Social: neg smoking, alcohol  or illicit drug use Surgical: c-section, laparoscopies for pelvic pain  Past Medical History: Past Medical History:  Diagnosis Date   Anemia    Anxiety    Asthma    CHILDHOOD   Bacterial vaginitis    Chronic anal fissure    Chronic kidney disease    INTERTERSIAL CYSTITIS   Complication of anesthesia    has concerns from anesthesia from  first  CS, breathing problems, 2nd csection legs numb   Contact dermatitis due to adhesives 07/06/2017   COVID-19    Frequency of urination    GERD (gastroesophageal reflux disease)    Headache    MIGRAINES    Heart murmur    mild no cardiologist   History of pre-eclampsia    Hypothyroidism    During pregnancy   Interstitial cystitis    LGSIL on Pap smear of cervix 2010   Nocturia    Pelvic pain in female    PONV (postoperative nausea and vomiting)    Spotting between menses    Urgency of urination  Past Surgical History: Past Surgical History:  Procedure Laterality Date   CESAREAN SECTION N/A 07/05/2017   Procedure: CESAREAN SECTION;  Surgeon: Meriam Stamp, MD;  Location: [facility];  Service: Obstetrics;  Laterality: N/A;   CESAREAN SECTION WITH BILATERAL TUBAL LIGATION N/A 11/21/2019   Procedure: CESAREAN SECTION WITH BILATERAL TUBAL LIGATION;  Surgeon: Meriam Stamp, MD;  Location: [facility];  Service:  Obstetrics;  Laterality: N/A;   CHROMOPERTUBATION Bilateral 07/23/2016   Procedure: CHROMOPERTUBATION;  Surgeon: Meriam Stamp, MD;  Location: [facility];  Service: Gynecology;  Laterality: Bilateral;   COLPOSCOPY  2010   CYSTO WITH HYDRODISTENSION  10/14/2011   Procedure: CYSTOSCOPY/HYDRODISTENSION;  Surgeon: Willye Harvey, MD;  Location: [facility];  Service: Urology;  Laterality: N/A;  M & P    DILATION AND CURETTAGE OF UTERUS  09/16/2011   Procedure: DILATATION AND CURETTAGE;  Surgeon: Deann Exon;  Location: [facility];  Service: Gynecology;  Laterality: N/A;  Endometrial Biopsy, Urethral Dilation   DILATION AND EVACUATION N/A 02/26/2016   Procedure: DILATATION AND EVACUATION/Chromosome Analysis;  Surgeon: Meriam Stamp, MD;  Location: [facility];  Service: Gynecology;  Laterality: N/A;  with chromosome studies    DILITATION & CURRETTAGE/HYSTROSCOPY WITH NOVASURE ABLATION N/A 07/03/2020   Procedure: DILATATION & CURETTAGE/HYSTEROSCOPY WITH NOVASURE ABLATION;  Surgeon: Meriam Stamp, MD;  Location: [facility] [place];  Service: Gynecology;  Laterality: N/A;   EVALUATION UNDER ANESTHESIA WITH HEMORRHOIDECTOMY N/A 07/16/2022   Procedure: EXAM UNDER ANESTHESIA WITH HEMORRHOIDECTOMY;  Surgeon: Jacolyn Matar, MD;  Location: [facility];  Service: General;  Laterality: N/A;   LAPAROSCOPIC LYSIS OF ADHESIONS     LAPAROSCOPY  09/16/2011   Procedure: LAPAROSCOPY OPERATIVE;  Surgeon: Deann Exon;  Location: [facility];  Service: Gynecology;  Laterality: N/A;   LAPAROSCOPY N/A 07/23/2016   Procedure: LAPAROSCOPY OPERATIVE, LYSIS OF ADHESIONS;  Surgeon: Meriam Stamp, MD;  Location: [facility];  Service: Gynecology;  Laterality: N/A;   NOSE SURGERY  2015   turbinate reduction   SEPTOPLASTY N/A 12/24/2019   Procedure: SEPTOPLASTY;  Surgeon: Janita Mellow, MD;  Location: [facility];  Service: ENT;  Laterality: N/A;   WISDOM TOOTH EXTRACTION  2008- ORAL SURG OFFICE    Family History: Family  History  Problem Relation Age of Onset   Healthy Mother    Healthy Father    Hypertension Sister    Colon cancer Paternal Grandfather    Esophageal cancer Maternal Uncle    Stomach cancer Neg Hx    Liver disease Neg Hx     Social History: Social History   Tobacco Use  Smoking Status Former   Current packs/day: 0.00   Average packs/day: 0.2 packs/day for 0.5 years (0.1 ttl pk-yrs)   Types: Cigarettes   Start date: 08/27/2010   Quit date: 02/25/2011   Years since quitting: 12.8  Smokeless Tobacco Never   Social History   Substance and Sexual Activity  Alcohol  Use No   Social History   Substance and Sexual Activity  Drug Use No    Allergies: Allergies  Allergen Reactions   Azithromycin  Nausea Only   Ethinyl Estradiol Other (See Comments)    Migraine headaches.   Macrobid [Nitrofurantoin Monohyd Macro] Other (See Comments)    FLU LIKE SYMPTONS   Penicillins Hives    Has patient had a PCN reaction causing immediate rash, facial/tongue/throat swelling, SOB or lightheadedness with hypotension: yes Has patient had a PCN reaction causing severe rash involving mucus membranes or skin necrosis: no Has  patient had a PCN reaction that required hospitalization no Has patient had a PCN reaction occurring within the last 10 years: yes If all of the above answers are "NO", then may proceed with Cephalosporin use.    Sertraline Other (See Comments)    Night terrors and increased weight    Medications: Current Outpatient Medications  Medication Sig Dispense Refill   albuterol  (VENTOLIN  HFA) 108 (90 Base) MCG/ACT inhaler Inhale 2 puffs into the lungs every 6 (six) hours as needed for wheezing or shortness of breath. 6.7 g 0   ALPRAZolam  (XANAX ) 0.5 MG tablet Take 1 tablet (0.5 mg total) by mouth daily as needed. Must last 30 days. 15 tablet 0   AMBULATORY NON FORMULARY MEDICATION Diltiazem 2% gel mixed with Lidocaine  5% Sig : apply a pea size amount to rectum three times daily  30 g 2   Cholecalciferol (VITAMIN D3) 50 MCG (2000 UT) capsule Take 2,000 Units by mouth daily.     cyclobenzaprine  (FLEXERIL ) 10 MG tablet Take 1 tablet (10 mg total) by mouth at bedtime. 90 tablet 3   diclofenac  (VOLTAREN ) 75 MG EC tablet Take 1 tablet (75 mg total) by mouth 2 (two) times daily. 50 tablet 2   diphenhydrAMINE  (BENADRYL ) 25 MG tablet Take 25 mg by mouth at bedtime as needed for allergies or sleep.     frovatriptan (FROVA) 2.5 MG tablet Take 2.5 mg by mouth daily as needed for migraine.     ibuprofen  (ADVIL ) 600 MG tablet Take 1 tablet (600 mg total) by mouth 3 (three) times daily as needed. 90 tablet 1   phentermine  (ADIPEX-P ) 37.5 MG tablet Take 1 tablet (37.5 mg total) by mouth every morning. 30 tablet 2   tretinoin  (RETIN-A ) 0.05 % cream Apply one pea sized amount to face every night 45 g 3   triamcinolone  (KENALOG ) 0.025 % cream Apply 1 Application topically daily as needed (Rash).     Ubrogepant  (UBRELVY ) 100 MG TABS Take 1 tablet by mouth as needed. 2 tablet 1   ALPRAZolam  (XANAX ) 0.5 MG tablet Take 1 tablet (0.5 mg total) by mouth daily as needed. 30 tablet 0   doxycycline  (VIBRAMYCIN ) 100 MG capsule Take 2 capsules (200 mg) by mouth daily for 7 days (Patient not taking: Reported on 01/12/2024) 14 capsule 0   metroNIDAZOLE  (METROGEL ) 0.75 % vaginal gel Place 1 Applicatorful vaginally at bedtime for 5 days (Patient not taking: Reported on 01/12/2024) 70 g 0   No current facility-administered medications for this visit.    Review of Systems: GENERAL: negative for malaise, night sweats HEENT: No changes in hearing or vision, no nose bleeds or other nasal problems. NECK: Negative for lumps, goiter, pain and significant neck swelling RESPIRATORY: Negative for cough, wheezing CARDIOVASCULAR: Negative for chest pain, leg swelling, palpitations, orthopnea GI: SEE HPI MUSCULOSKELETAL: Negative for joint pain or swelling, back pain, and muscle pain. SKIN: Negative for lesions,  rash PSYCH: Negative for sleep disturbance, mood disorder and recent psychosocial stressors. HEMATOLOGY Negative for prolonged bleeding, bruising easily, and swollen nodes. ENDOCRINE: Negative for cold or heat intolerance, polyuria, polydipsia and goiter. NEURO: negative for tremor, gait imbalance, syncope and seizures. The remainder of the review of systems is noncontributory.   Physical Exam: BP 110/72 (BP Location: Left Arm, Patient Position: Sitting, Cuff Size: Normal)   Pulse (!) 103   Temp 98.4 F (36.9 C) (Temporal)   Ht 5\' 4"  (1.626 m)   Wt 176 lb 8 oz (80.1 kg)   BMI 30.30  kg/m  GENERAL: The patient is AO x3, in no acute distress. HEENT: Head is normocephalic and atraumatic. EOMI are intact. Mouth is well hydrated and without lesions. NECK: Supple. No masses LUNGS: Clear to auscultation. No presence of rhonchi/wheezing/rales. Adequate chest expansion HEART: RRR, normal s1 and s2. ABDOMEN: Soft, nontender, no guarding, no peritoneal signs, and nondistended. BS +. No masses. RECTAL EXAM: presence of small external hemorrhoids, there was a hemorrhoidectomy scar, there was also presence of very superficial tear at 5', there was presence of Douglas tender mucosa at the same location on rectal examination normal tone, no masses, brown stool without blood. Chaperone: Umberto Ganong, CMA EXTREMITIES: Without any cyanosis, clubbing, rash, lesions or edema. NEUROLOGIC: AOx3, no focal motor deficit. SKIN: no jaundice, no rashes   Imaging/Labs: as above  I personally reviewed and interpreted the available labs, imaging and endoscopic files.  Impression and Plan: CAROLENE POTRYKUS is a 38 y.o. female with Pmh interstitial cystitis, migraines, who presents for evaluation of constipation and rectal bleeding. The patient has presented chronic issues with constipation after her deliveries.  Has not presented improvement with over-the-counter laxatives and has recently presented recurrent  episodes of rectal bleeding due to frequent straining.  In fact, in the past she required to undergo hemorrhoidectomy as straining led to symptomatic large hemorrhoids.  However, her symptoms recurred despite surgical management.  Physical exam today shows presence of external hemorrhoids and possible enlarged internal hemorrhoids.  There is also presence of a superficial fissure.  We discussed we will attempt treating her symptoms pharmacologically with a combination of Anusol  suppositories and diltiazem cream for concomitant hemorrhoids and fissure, but will also start him on Linzess  145 mcg every day.  She can also use topical therapies such as sitz bath's.  Given the presence of recurrent rectal bleeding, we will need to evaluate further her symptoms with a colonoscopy but we will defer this for follow-up appointment until her acute and rectal disease improves.  As she is also presenting some issues with dysphagia concomitantly, we will need to evaluate this further with an EGD at the same time of colonoscopy.  -Start Linzess  145 mcg every day.  If no improvement after 2 weeks, can go up to 2 pills/day. -Use Anusol  suppositories for 10 days -Continue diltiazem cream twice a day -Will discuss scheduling EGD and colonoscopy in next appointment -Try to keep stools soft  -Do no strain to move bowels -Use bath sitz regularly   All questions were answered.      Ashley Cress, MD Gastroenterology and Hepatology [facility]

## 2024-01-12 NOTE — Telephone Encounter (Signed)
 Discussed with patient per Dr. Castaneda - Amitiza  24 micrograms twice daily was sent to her pharmacy.  Patient verbalized understanding.

## 2024-01-12 NOTE — Telephone Encounter (Signed)
 Amitiza  24 micrograms twice daily was sent to her pharmacy.

## 2024-01-12 NOTE — Telephone Encounter (Signed)
 Patient seen today and linzess  prescribed is over $1000 and that is with savings card. She is asking for alternative.

## 2024-01-12 NOTE — Patient Instructions (Addendum)
 Start Linzess  145 mcg every day.  If no improvement after 2 weeks, can go up to 2 pills/day. Use Anusol  suppositories Continue diltiazem cream twice a day Will discuss scheduling colonoscopy in next appointment Try to keep stools soft  Do no strain to move bowels Use bath sitz regularly - Can buy at [facility] or [facility]   ------ Linzess  works best when taken once a day every day, on an empty stomach, at least 30 minutes before your first meal of the day.  When Linzess  is taken daily as directed:  *Constipation relief is typically felt in about a week *IBS-C patients may begin to experience relief from belly pain and overall abdominal symptoms (pain, discomfort, and bloating) in about 1 week,   with symptoms typically improving over 12 weeks.  Diarrhea, nausea or abdominal cramping may occur in the first 2 weeks -keep taking it.  These symptoms should eventually resolve. Please notify us  if having more than 4 watery bowel movements per day or fecal soiling accidents.

## 2024-02-06 ENCOUNTER — Other Ambulatory Visit (HOSPITAL_COMMUNITY): Payer: Self-pay

## 2024-02-07 ENCOUNTER — Other Ambulatory Visit (HOSPITAL_COMMUNITY): Payer: Self-pay

## 2024-02-09 ENCOUNTER — Other Ambulatory Visit: Payer: Self-pay

## 2024-02-10 ENCOUNTER — Other Ambulatory Visit: Payer: Self-pay

## 2024-02-10 ENCOUNTER — Other Ambulatory Visit (HOSPITAL_COMMUNITY): Payer: Self-pay

## 2024-02-11 ENCOUNTER — Telehealth

## 2024-02-11 DIAGNOSIS — Z20818 Contact with and (suspected) exposure to other bacterial communicable diseases: Secondary | ICD-10-CM | POA: Diagnosis not present

## 2024-02-11 DIAGNOSIS — J029 Acute pharyngitis, unspecified: Secondary | ICD-10-CM

## 2024-02-12 MED ORDER — CLINDAMYCIN HCL 300 MG PO CAPS
300.0000 mg | ORAL_CAPSULE | Freq: Three times a day (TID) | ORAL | 0 refills | Status: AC
Start: 2024-02-12 — End: 2024-02-22

## 2024-02-12 NOTE — Progress Notes (Signed)
E-Visit for Sore Throat - Strep Symptoms  We are sorry that you are not feeling well.  Here is how we plan to help!  Based on what you have shared with me it is likely that you have strep pharyngitis.  Strep pharyngitis is inflammation and infection in the back of the throat.  This is an infection cause by bacteria and is treated with antibiotics.  I have prescribed Clindamycin 300 mg three times a day for 10 days. For throat pain, we recommend over the counter oral pain relief medications such as acetaminophen or aspirin, or anti-inflammatory medications such as ibuprofen or naproxen sodium. Topical treatments such as oral throat lozenges or sprays may be used as needed. Strep infections are not as easily transmitted as other respiratory infections, however we still recommend that you avoid close contact with loved ones, especially the very young and elderly.  Remember to wash your hands thoroughly throughout the day as this is the number one way to prevent the spread of infection and wipe down door knobs and counters with disinfectant.   Home Care: Only take medications as instructed by your medical team. Complete the entire course of an antibiotic. Do not take these medications with alcohol. A steam or ultrasonic humidifier can help congestion.  You can place a towel over your head and breathe in the steam from hot water coming from a faucet. Avoid close contacts especially the very young and the elderly. Cover your mouth when you cough or sneeze. Always remember to wash your hands.  Get Help Right Away If: You develop worsening fever or sinus pain. You develop a severe head ache or visual changes. Your symptoms persist after you have completed your treatment plan.  Make sure you Understand these instructions. Will watch your condition. Will get help right away if you are not doing well or get worse.   Thank you for choosing an e-visit.  Your e-visit answers were reviewed by a board  certified advanced clinical practitioner to complete your personal care plan. Depending upon the condition, your plan could have included both over the counter or prescription medications.  Please review your pharmacy choice. Make sure the pharmacy is open so you can pick up prescription now. If there is a problem, you may contact your provider through MyChart messaging and have the prescription routed to another pharmacy.  Your safety is important to us. If you have drug allergies check your prescription carefully.   For the next 24 hours you can use MyChart to ask questions about today's visit, request a non-urgent call back, or ask for a work or school excuse. You will get an email in the next two days asking about your experience. I hope that your e-visit has been valuable and will speed your recovery.  Approximately 5 minutes was spent documenting and reviewing patient's chart.    

## 2024-02-15 ENCOUNTER — Ambulatory Visit: Admitting: Gastroenterology

## 2024-03-01 ENCOUNTER — Other Ambulatory Visit: Payer: Self-pay

## 2024-03-07 ENCOUNTER — Other Ambulatory Visit: Payer: Self-pay

## 2024-03-08 ENCOUNTER — Ambulatory Visit (INDEPENDENT_AMBULATORY_CARE_PROVIDER_SITE_OTHER): Admitting: Gastroenterology

## 2024-03-09 ENCOUNTER — Other Ambulatory Visit (HOSPITAL_COMMUNITY): Payer: Self-pay

## 2024-03-10 ENCOUNTER — Other Ambulatory Visit (HOSPITAL_COMMUNITY): Payer: Self-pay

## 2024-03-15 ENCOUNTER — Other Ambulatory Visit (HOSPITAL_COMMUNITY): Payer: Self-pay

## 2024-03-22 ENCOUNTER — Ambulatory Visit (INDEPENDENT_AMBULATORY_CARE_PROVIDER_SITE_OTHER): Admitting: Gastroenterology

## 2024-04-04 ENCOUNTER — Other Ambulatory Visit (HOSPITAL_COMMUNITY): Payer: Self-pay

## 2024-04-05 ENCOUNTER — Other Ambulatory Visit (HOSPITAL_BASED_OUTPATIENT_CLINIC_OR_DEPARTMENT_OTHER): Payer: Self-pay

## 2024-04-05 ENCOUNTER — Other Ambulatory Visit (HOSPITAL_COMMUNITY): Payer: Self-pay

## 2024-04-10 ENCOUNTER — Other Ambulatory Visit: Payer: Self-pay

## 2024-04-10 ENCOUNTER — Telehealth: Admitting: Physician Assistant

## 2024-04-10 ENCOUNTER — Other Ambulatory Visit (HOSPITAL_COMMUNITY): Payer: Self-pay

## 2024-04-10 DIAGNOSIS — R3989 Other symptoms and signs involving the genitourinary system: Secondary | ICD-10-CM | POA: Diagnosis not present

## 2024-04-10 MED ORDER — IBUPROFEN 600 MG PO TABS
600.0000 mg | ORAL_TABLET | Freq: Three times a day (TID) | ORAL | 1 refills | Status: AC | PRN
  Filled 2024-04-10: qty 90, 30d supply, fill #0
  Filled 2024-08-15: qty 90, 30d supply, fill #1

## 2024-04-10 MED ORDER — SULFAMETHOXAZOLE-TRIMETHOPRIM 800-160 MG PO TABS
1.0000 | ORAL_TABLET | Freq: Two times a day (BID) | ORAL | 0 refills | Status: DC
Start: 2024-04-10 — End: 2024-07-08
  Filled 2024-04-10: qty 10, 5d supply, fill #0

## 2024-04-10 NOTE — Progress Notes (Signed)
 E-Visit for Urinary Problems  We are sorry that you are not feeling well.  Here is how we plan to help!  Based on what you shared with me it looks like you most likely have a simple urinary tract infection.  A UTI (Urinary Tract Infection) is a bacterial infection of the bladder.  Most cases of urinary tract infections are simple to treat but a key part of your care is to encourage you to drink plenty of fluids and watch your symptoms carefully.  I have prescribed Bactrim DS One tablet twice a day for 5 days.  Your symptoms should gradually improve. Call us if the burning in your urine worsens, you develop worsening fever, back pain or pelvic pain or if your symptoms do not resolve after completing the antibiotic.  Urinary tract infections can be prevented by drinking plenty of water to keep your body hydrated.  Also be sure when you wipe, wipe from front to back and don't hold it in!  If possible, empty your bladder every 4 hours.  HOME CARE Drink plenty of fluids Compete the full course of the antibiotics even if the symptoms resolve Remember, when you need to go.go. Holding in your urine can increase the likelihood of getting a UTI! GET HELP RIGHT AWAY IF: You cannot urinate You get a high fever Worsening back pain occurs You see blood in your urine You feel sick to your stomach or throw up You feel like you are going to pass out  MAKE SURE YOU  Understand these instructions. Will watch your condition. Will get help right away if you are not doing well or get worse.   Thank you for choosing an e-visit.  Your e-visit answers were reviewed by a board certified advanced clinical practitioner to complete your personal care plan. Depending upon the condition, your plan could have included both over the counter or prescription medications.  Please review your pharmacy choice. Make sure the pharmacy is open so you can pick up prescription now. If there is a problem, you may contact  your provider through [employer] and have the prescription routed to another pharmacy.  Your safety is important to [place]. If you have drug allergies check your prescription carefully.   For the next 24 hours you can use MyChart to ask questions about today's visit, request a non-urgent call back, or ask for a work or school excuse. You will get an email in the next two days asking about your experience. I hope that your e-visit has been valuable and will speed your recovery.    I have spent 5 minutes in review of e-visit questionnaire, review and updating patient chart, medical decision making and response to patient.   Margaretann Loveless, PA-C

## 2024-05-11 ENCOUNTER — Other Ambulatory Visit (HOSPITAL_COMMUNITY): Payer: Self-pay

## 2024-05-11 DIAGNOSIS — E039 Hypothyroidism, unspecified: Secondary | ICD-10-CM | POA: Diagnosis not present

## 2024-05-11 DIAGNOSIS — E559 Vitamin D deficiency, unspecified: Secondary | ICD-10-CM | POA: Diagnosis not present

## 2024-05-11 DIAGNOSIS — R002 Palpitations: Secondary | ICD-10-CM | POA: Diagnosis not present

## 2024-05-11 DIAGNOSIS — N3010 Interstitial cystitis (chronic) without hematuria: Secondary | ICD-10-CM | POA: Diagnosis not present

## 2024-05-11 DIAGNOSIS — E663 Overweight: Secondary | ICD-10-CM | POA: Diagnosis not present

## 2024-05-11 MED ORDER — PHENTERMINE HCL 37.5 MG PO TABS
37.5000 mg | ORAL_TABLET | Freq: Every day | ORAL | 0 refills | Status: DC
  Filled 2024-05-11: qty 30, 30d supply, fill #0

## 2024-06-07 ENCOUNTER — Ambulatory Visit (INDEPENDENT_AMBULATORY_CARE_PROVIDER_SITE_OTHER): Admitting: Gastroenterology

## 2024-06-07 ENCOUNTER — Other Ambulatory Visit: Payer: Self-pay

## 2024-06-07 ENCOUNTER — Telehealth (INDEPENDENT_AMBULATORY_CARE_PROVIDER_SITE_OTHER): Payer: Self-pay

## 2024-06-07 ENCOUNTER — Encounter (INDEPENDENT_AMBULATORY_CARE_PROVIDER_SITE_OTHER): Payer: Self-pay | Admitting: Gastroenterology

## 2024-06-07 ENCOUNTER — Other Ambulatory Visit (HOSPITAL_COMMUNITY): Payer: Self-pay

## 2024-06-07 VITALS — BP 118/77 | HR 88 | Temp 97.8°F | Ht 63.5 in | Wt 165.7 lb

## 2024-06-07 DIAGNOSIS — R1310 Dysphagia, unspecified: Secondary | ICD-10-CM

## 2024-06-07 DIAGNOSIS — K582 Mixed irritable bowel syndrome: Secondary | ICD-10-CM

## 2024-06-07 DIAGNOSIS — K5900 Constipation, unspecified: Secondary | ICD-10-CM

## 2024-06-07 DIAGNOSIS — R12 Heartburn: Secondary | ICD-10-CM | POA: Diagnosis not present

## 2024-06-07 DIAGNOSIS — K601 Chronic anal fissure: Secondary | ICD-10-CM

## 2024-06-07 DIAGNOSIS — K219 Gastro-esophageal reflux disease without esophagitis: Secondary | ICD-10-CM

## 2024-06-07 MED ORDER — PEG 3350-KCL-NA BICARB-NACL 420 G PO SOLR
4000.0000 mL | Freq: Once | ORAL | 0 refills | Status: AC
  Filled 2024-06-07 – 2024-06-25 (×3): qty 4000, 1d supply, fill #0

## 2024-06-07 MED ORDER — TRULANCE 3 MG PO TABS
3.0000 mg | ORAL_TABLET | Freq: Every day | ORAL | 3 refills | Status: AC
Start: 2024-06-07 — End: ?
  Filled 2024-06-07: qty 90, 90d supply, fill #0

## 2024-06-07 NOTE — Telephone Encounter (Signed)
 Spoke with patient, scheduled TCS/EGD for 06/22/2024 at 10:30am. Rx sent to pharmacy. Instructions sent to mychart.

## 2024-06-07 NOTE — Progress Notes (Signed)
 Toribio Fortune, M.D. Gastroenterology & Hepatology [facility] [address] [place], [place] [address]  Primary Care Physician: Leonel Cole, MD [address] [place] [address]  I will communicate my assessment and recommendations to the referring MD via EMR.  Problems: Chronic constipation Perianal fissure Dysphagia  History of Present Illness: [name] is a 38 y.o. female with Pmh interstitial cystitis, migraines, perianal fissure, who presents for follow up of dysphagia, constipation and rectal bleeding.  The patient was last seen on 01/12/2024. At that time, the patient was started on Linzess  but this was not covered by her insurance, so she was switched to Amitiza  24 mcg twice a day.  She was given diltiazem for fissure and Anusol  suppositories for hemorrhoids.  States she is having a BM every other day with prunes. She states she feels this is not complete. Did not feel any improvement with Amitiza  24 mcg twice a day for almost 3 months. She is still having intermittent lower abdominal pain, but no bloating.  Patient reports that she has felt some improvement with the diltiazem cream, although there is some discomfort. No rectal bleeding at the moment.  She states having intermittent heartburn on a daily basis. She tries Pepcid without too much improvement. Sometimes it takes longer for her food to go down.  The patient denies having any nausea, vomiting, fever, chills, hematochezia, melena, hematemesis, abdominal distention, abdominal pain, diarrhea, jaundice, pruritus. She has been losing weight with phentermine .  Last EGD: never Last Colonoscopy: never  Past Medical History: Past Medical History:  Diagnosis Date   Anemia    Anxiety    Asthma    CHILDHOOD   Bacterial vaginitis    Chronic anal fissure    Chronic kidney disease    INTERTERSIAL CYSTITIS   Complication of anesthesia    has concerns  from anesthesia from  first  CS, breathing problems, 2nd csection legs numb   Contact dermatitis due to adhesives 07/06/2017   COVID-19    Frequency of urination    GERD (gastroesophageal reflux disease)    Headache    MIGRAINES    Heart murmur    mild no cardiologist   History of pre-eclampsia    Hypothyroidism    During pregnancy   Interstitial cystitis    LGSIL on Pap smear of cervix 2010   Nocturia    Pelvic pain in female    PONV (postoperative nausea and vomiting)    Spotting between menses    Urgency of urination     Past Surgical History: Past Surgical History:  Procedure Laterality Date   CESAREAN SECTION N/A 07/05/2017   Procedure: CESAREAN SECTION;  Surgeon: Gorge Ade, MD;  [facility];  Service: Obstetrics;  Laterality: N/A;   CESAREAN SECTION WITH BILATERAL TUBAL LIGATION N/A 11/21/2019   Procedure: CESAREAN SECTION WITH BILATERAL TUBAL LIGATION;  Surgeon: Gorge Ade, MD;  [facility]: [facility];  Service: Obstetrics;  Laterality: N/A;   CHROMOPERTUBATION Bilateral 07/23/2016   Procedure: CHROMOPERTUBATION;  Surgeon: Ade Gorge, MD;  Location: [facility];  Service: Gynecology;  Laterality: Bilateral;   COLPOSCOPY  2010   CYSTO WITH HYDRODISTENSION  10/14/2011   Procedure: CYSTOSCOPY/HYDRODISTENSION;  Surgeon: Norleen JINNY Seltzer, MD;  Location: [facility];  Service: Urology;  Laterality: N/A;  M & P    DILATION AND CURETTAGE OF UTERUS  09/16/2011   Procedure: DILATATION AND CURETTAGE;  Surgeon: Peggye Gull;  Location: [facility];  Service: Gynecology;  Laterality: N/A;  Endometrial Biopsy, Urethral Dilation   DILATION AND EVACUATION N/A 02/26/2016   Procedure: DILATATION AND EVACUATION/Chromosome Analysis;  Surgeon: Charlie Flowers, MD;  Location: [facility];  Service: Gynecology;  Laterality: N/A;  with chromosome studies    DILITATION & CURRETTAGE/HYSTROSCOPY WITH NOVASURE ABLATION N/A 07/03/2020   Procedure: DILATATION & CURETTAGE/HYSTEROSCOPY  WITH NOVASURE ABLATION;  Surgeon: Flowers Charlie, MD;  Location: [facility];  Service: Gynecology;  Laterality: N/A;   EVALUATION UNDER ANESTHESIA WITH HEMORRHOIDECTOMY N/A 07/16/2022   Procedure: EXAM UNDER ANESTHESIA WITH HEMORRHOIDECTOMY;  Surgeon: Gladis Cough, MD;  Location: [facility];  Service: General;  Laterality: N/A;   LAPAROSCOPIC LYSIS OF ADHESIONS     LAPAROSCOPY  09/16/2011   Procedure: LAPAROSCOPY OPERATIVE;  Surgeon: Peggye Gull;  [facility]: [facility];  Service: Gynecology;  Laterality: N/A;   LAPAROSCOPY N/A 07/23/2016   Procedure: LAPAROSCOPY OPERATIVE, LYSIS OF ADHESIONS;  Surgeon: Charlie Flowers, MD;  Location: [facility];  Service: Gynecology;  Laterality: N/A;   NOSE SURGERY  2015   turbinate reduction   SEPTOPLASTY N/A 12/24/2019   Procedure: SEPTOPLASTY;  Surgeon: Jesus Oliphant, MD;  Location: [facility];  Service: ENT;  Laterality: N/A;   WISDOM TOOTH EXTRACTION  2008- ORAL SURG OFFICE    Family History: Family History  Problem Relation Age of Onset   Healthy Mother    Healthy Father    Hypertension Sister    Colon cancer Paternal Grandfather    Esophageal cancer Maternal Uncle    Stomach cancer Neg Hx    Liver disease Neg Hx     Social History: Social History   Tobacco Use  Smoking Status Former   Current packs/day: 0.00   Average packs/day: 0.2 packs/day for 0.5 years (0.1 ttl pk-yrs)   Types: Cigarettes   Start date: 08/27/2010   Quit date: 02/25/2011   Years since quitting: 13.2  Smokeless Tobacco Never   Social History   Substance and Sexual Activity  Alcohol  Use No   Social History   Substance and Sexual Activity  Drug Use No    Allergies: Allergies  Allergen Reactions   Azithromycin  Nausea Only   Ethinyl Estradiol Other (See Comments)    Migraine headaches.   Macrobid [Nitrofurantoin Monohyd Macro] Other (See Comments)    FLU LIKE SYMPTONS   Penicillins Hives    Has patient had a PCN reaction causing  immediate rash, facial/tongue/throat swelling, SOB or lightheadedness with hypotension: yes Has patient had a PCN reaction causing severe rash involving mucus membranes or skin necrosis: no Has patient had a PCN reaction that required hospitalization no Has patient had a PCN reaction occurring within the last 10 years: yes If all of the above answers are NO, then may proceed with Cephalosporin use.    Sertraline Other (See Comments)    Night terrors and increased weight    Medications: Current Outpatient Medications  Medication Sig Dispense Refill   albuterol  (VENTOLIN  HFA) 108 (90 Base) MCG/ACT inhaler Inhale 2 puffs into the lungs every 6 (six) hours as needed for wheezing or shortness of breath. 6.7 g 0   ALPRAZolam  (XANAX ) 0.5 MG tablet Take 1 tablet (0.5 mg total) by mouth daily as needed. Must last 30 days. 15 tablet 0   AMBULATORY NON FORMULARY MEDICATION Diltiazem 2% gel mixed with Lidocaine  5% Sig : apply a pea size amount to rectum three times daily 30 g 2   Cholecalciferol (VITAMIN D3) 50 MCG (2000 UT) capsule Take 2,000  Units by mouth daily.     cyclobenzaprine  (FLEXERIL ) 10 MG tablet Take 1 tablet (10 mg total) by mouth at bedtime. 90 tablet 3   diclofenac  (VOLTAREN ) 75 MG EC tablet Take 1 tablet (75 mg total) by mouth 2 (two) times daily. 50 tablet 2   diphenhydrAMINE  (BENADRYL ) 25 MG tablet Take 25 mg by mouth at bedtime as needed for allergies or sleep.     hydrocortisone  (ANUSOL -HC) 25 MG suppository Place 1 suppository (25 mg total) rectally 2 (two) times daily. 20 suppository 0   ibuprofen  (ADVIL ) 600 MG tablet Take 1 tablet (600 mg total) by mouth 3 (three) times daily as needed. 90 tablet 1   phentermine  (ADIPEX-P ) 37.5 MG tablet Take 1 tablet (37.5 mg total) by mouth daily before breakfast. 30 tablet 0   sulfamethoxazole -trimethoprim  (BACTRIM  DS) 800-160 MG tablet Take 1 tablet by mouth 2 (two) times daily. 10 tablet 0   tretinoin  (RETIN-A ) 0.05 % cream Apply one pea  sized amount to face every night 45 g 3   triamcinolone  (KENALOG ) 0.025 % cream Apply 1 Application topically daily as needed (Rash).     Ubrogepant  (UBRELVY ) 100 MG TABS Take 1 tablet (100 mg total) by mouth as needed. May take second dose at least 2 hours after first dose as needed 20 tablet 1   ALPRAZolam  (XANAX ) 0.5 MG tablet Take 1 tablet (0.5 mg total) by mouth daily as needed. 30 tablet 0   frovatriptan (FROVA) 2.5 MG tablet Take 2.5 mg by mouth daily as needed for migraine. (Patient not taking: Reported on 06/07/2024)     ibuprofen  (ADVIL ) 600 MG tablet Take 1 tablet (600 mg total) by mouth 3 (three) times daily as needed. 90 tablet 1   lubiprostone  (AMITIZA ) 24 MCG capsule Take 1 capsule (24 mcg total) by mouth 2 (two) times daily with a meal. (Patient not taking: Reported on 06/07/2024) 180 capsule 3   No current facility-administered medications for this visit.    Review of Systems: GENERAL: negative for malaise, night sweats HEENT: No changes in hearing or vision, no nose bleeds or other nasal problems. NECK: Negative for lumps, goiter, pain and significant neck swelling RESPIRATORY: Negative for cough, wheezing CARDIOVASCULAR: Negative for chest pain, leg swelling, palpitations, orthopnea GI: SEE HPI MUSCULOSKELETAL: Negative for joint pain or swelling, back pain, and muscle pain. SKIN: Negative for lesions, rash PSYCH: Negative for sleep disturbance, mood disorder and recent psychosocial stressors. HEMATOLOGY Negative for prolonged bleeding, bruising easily, and swollen nodes. ENDOCRINE: Negative for cold or heat intolerance, polyuria, polydipsia and goiter. NEURO: negative for tremor, gait imbalance, syncope and seizures. The remainder of the review of systems is noncontributory.   Physical Exam: BP 118/77 (BP Location: Left Arm, Patient Position: Sitting, Cuff Size: Normal)   Pulse 88   Temp 97.8 F (36.6 C) (Temporal)   Ht 5' 3.5 (1.613 m)   Wt 165 lb 11.2 oz (75.2  kg)   BMI 28.89 kg/m  GENERAL: The patient is AO x3, in no acute distress. HEENT: Head is normocephalic and atraumatic. EOMI are intact. Mouth is well hydrated and without lesions. NECK: Supple. No masses LUNGS: Clear to auscultation. No presence of rhonchi/wheezing/rales. Adequate chest expansion HEART: RRR, normal s1 and s2. ABDOMEN: Soft, nontender, no guarding, no peritoneal signs, and nondistended. BS +. No masses. EXTREMITIES: Without any cyanosis, clubbing, rash, lesions or edema. NEUROLOGIC: AOx3, no focal motor deficit. SKIN: no jaundice, no rashes  Imaging/Labs: as above  I personally reviewed and interpreted the  available labs, imaging and endoscopic files.  Impression and Plan: [name] is a 38 y.o. female with Pmh interstitial cystitis, migraines, perianal fissure, who presents for follow up of dysphagia, constipation and rectal bleeding.  Patient has presented persistent issues with constipation and incomplete defecation despite using Amitiza  in the past.  She reports that the symptoms started after her last pregnancy.  I discussed with the patient that I suspect her symptoms may be related to pelvic floor dysfunction, but other etiologies will need to be rule out first.  Due to this we will proceed with colonoscopy and will consider eventual anorectal manometry based on findings.  For now, she was advised to continue eating  fruits with fiber and she will start Trulance  daily.  Notably, her anal fissure appears to be improving with the use of diltiazem, which she is to continue for now.  She is also presenting family issues with dysphagia and heartburn, for which we will proceed with an EGD at the same time of colonoscopy.  -Schedule EGD and colonoscopy -Start Trulance  3 mg daily -Eat prunes and/or 2 kiwis daily -Continue with diltiazem cream for rectal area  All questions were answered.      Toribio Fortune, MD Gastroenterology and Hepatology [facility]

## 2024-06-07 NOTE — Patient Instructions (Addendum)
 Schedule EGD and colonoscopy Start Trulance  3 mg daily Eat prunes and/or 2 kiwis daily Continue with diltiazem cream for rectal area

## 2024-06-07 NOTE — Telephone Encounter (Signed)
 ATC pt to schedule EGD/TCS, no answer. LVM.

## 2024-06-07 NOTE — Addendum Note (Signed)
 Addended by: DALLIE LIONEL RAMAN on: 06/07/2024 03:04 PM   Modules accepted: Orders

## 2024-06-07 NOTE — H&P (View-Only) (Signed)
 [name], M.D. Gastroenterology & Hepatology [facility]enter[facility] [address] [place], [place] [address]  Primary Care Physician: Leonel Cole, MD [address] [place] [address]  I will communicate my assessment and recommendations to the referring MD via EMR.  Problems: Chronic constipation Perianal fissure Dysphagia  History of Present Illness: [name] is a 38 y.o. female with Pmh interstitial cystitis, migraines, perianal fissure, who presents for follow up of dysphagia, constipation and rectal bleeding.  The patient was last seen on 01/12/2024. At that time, the patient was started on Linzess  but this was not covered by her insurance, so she was switched to Amitiza  24 mcg twice a day.  She was given diltiazem for fissure and Anusol  suppositories for hemorrhoids.  States she is having a BM every other day with prunes. She states she feels this is not complete. Did not feel any improvement with Amitiza  24 mcg twice a day for almost 3 months. She is still having intermittent lower abdominal pain, but no bloating.  Patient reports that she has felt some improvement with the diltiazem cream, although there is some discomfort. No rectal bleeding at the moment.  She states having intermittent heartburn on a daily basis. She tries Pepcid without too much improvement. Sometimes it takes longer for her food to go down.  The patient denies having any nausea, vomiting, fever, chills, hematochezia, melena, hematemesis, abdominal distention, abdominal pain, diarrhea, jaundice, pruritus. She has been losing weight with phentermine .  Last EGD: never Last Colonoscopy: never  Past Medical History: Past Medical History:  Diagnosis Date   Anemia    Anxiety    Asthma    CHILDHOOD   Bacterial vaginitis    Chronic anal fissure    Chronic kidney disease    INTERTERSIAL CYSTITIS   Complication of anesthesia    has concerns  from anesthesia from  first  CS, breathing problems, 2nd csection legs numb   Contact dermatitis due to adhesives 07/06/2017   COVID-19    Frequency of urination    GERD (gastroesophageal reflux disease)    Headache    MIGRAINES    Heart murmur    mild no cardiologist   History of pre-eclampsia    Hypothyroidism    During pregnancy   Interstitial cystitis    LGSIL on Pap smear of cervix 2010   Nocturia    Pelvic pain in female    PONV (postoperative nausea and vomiting)    Spotting between menses    Urgency of urination     Past Surgical History: Past Surgical History:  Procedure Laterality Date   CESAREAN SECTION N/A 07/05/2017   Procedure: CESAREAN SECTION;  Surgeon: Gorge Ade, MD;  [facility];  Service: Obstetrics;  Laterality: N/A;   CESAREAN SECTION WITH BILATERAL TUBAL LIGATION N/A 11/21/2019   Procedure: CESAREAN SECTION WITH BILATERAL TUBAL LIGATION;  Surgeon: Gorge Ade, MD;  [facility];  Service: Obstetrics;  Laterality: N/A;   CHROMOPERTUBATION Bilateral 07/23/2016   Procedure: CHROMOPERTUBATION;  Surgeon: Ade Gorge, MD;  Location[facility];  Service: Gynecology;  Laterality: Bilateral;   COLPOSCOPY  2010   CYSTO WITH HYDRODISTENSION  10/14/2011   Procedure: CYSTOSCOPY/HYDRODISTENSION;  Surgeon: Norleen JINNY Seltzer, MD;  Location: [facility];  Service: Urology;  Laterality: N/A;  M & P    DILATION AND CURETTAGE OF UTERUS  09/16/2011   Procedure: DILATATION AND CURETTAGE;  Surgeon: Peggye Gull;  [facility];  Service: Gynecology;  Laterality: N/A;  Endometrial Biopsy, Urethral Dilation   DILATION AND EVACUATION N/A 02/26/2016   Procedure: DILATATION AND EVACUATION/Chromosome Analysis;  Surgeon: Charlie Flowers, MD;  [facility];  Service: Gynecology;  Laterality: N/A;  with chromosome studies    DILITATION & CURRETTAGE/HYSTROSCOPY WITH NOVASURE ABLATION N/A 07/03/2020   Procedure: DILATATION & CURETTAGE/HYSTEROSCOPY  WITH NOVASURE ABLATION;  Surgeon: Flowers Charlie, MD;  Location: [facility];  Service: Gynecology;  Laterality: N/A;   EVALUATION UNDER ANESTHESIA WITH HEMORRHOIDECTOMY N/A 07/16/2022   Procedure: EXAM UNDER ANESTHESIA WITH HEMORRHOIDECTOMY;  Surgeon: Gladis Cough, MD;  Location[facility];  Service: General;  Laterality: N/A;   LAPAROSCOPIC LYSIS OF ADHESIONS     LAPAROSCOPY  09/16/2011   Procedure: LAPAROSCOPY OPERATIVE;  Surgeon: Peggye Gull;  [facility];  Service: Gynecology;  Laterality: N/A;   LAPAROSCOPY N/A 07/23/2016   Procedure: LAPAROSCOPY OPERATIVE, LYSIS OF ADHESIONS;  Surgeon: Charlie Flowers, MD;  [facility];  Service: Gynecology;  Laterality: N/A;   NOSE SURGERY  2015   turbinate reduction   SEPTOPLASTY N/A 12/24/2019   Procedure: SEPTOPLASTY;  Surgeon: Jesus Oliphant, MD;  Location: [facility];  Service: ENT;  Laterality: N/A;   WISDOM TOOTH EXTRACTION  2008- ORAL SURG OFFICE    Family History: Family History  Problem Relation Age of Onset   Healthy Mother    Healthy Father    Hypertension Sister    Colon cancer Paternal Grandfather    Esophageal cancer Maternal Uncle    Stomach cancer Neg Hx    Liver disease Neg Hx     Social History: Social History   Tobacco Use  Smoking Status Former   Current packs/day: 0.00   Average packs/day: 0.2 packs/day for 0.5 years (0.1 ttl pk-yrs)   Types: Cigarettes   Start date: 08/27/2010   Quit date: 02/25/2011   Years since quitting: 13.2  Smokeless Tobacco Never   Social History   Substance and Sexual Activity  Alcohol  Use No   Social History   Substance and Sexual Activity  Drug Use No    Allergies: Allergies  Allergen Reactions   Azithromycin  Nausea Only   Ethinyl Estradiol Other (See Comments)    Migraine headaches.   Macrobid [Nitrofurantoin Monohyd Macro] Other (See Comments)    FLU LIKE SYMPTONS   Penicillins Hives    Has patient had a PCN reaction causing  immediate rash, facial/tongue/throat swelling, SOB or lightheadedness with hypotension: yes Has patient had a PCN reaction causing severe rash involving mucus membranes or skin necrosis: no Has patient had a PCN reaction that required hospitalization no Has patient had a PCN reaction occurring within the last 10 years: yes If all of the above answers are NO, then may proceed with Cephalosporin use.    Sertraline Other (See Comments)    Night terrors and increased weight    Medications: Current Outpatient Medications  Medication Sig Dispense Refill   albuterol  (VENTOLIN  HFA) 108 (90 Base) MCG/ACT inhaler Inhale 2 puffs into the lungs every 6 (six) hours as needed for wheezing or shortness of breath. 6.7 g 0   ALPRAZolam  (XANAX ) 0.5 MG tablet Take 1 tablet (0.5 mg total) by mouth daily as needed. Must last 30 days. 15 tablet 0   AMBULATORY NON FORMULARY MEDICATION Diltiazem 2% gel mixed with Lidocaine  5% Sig : apply a pea size amount to rectum three times daily 30 g 2   Cholecalciferol (VITAMIN D3) 50 MCG (2000 UT) capsule Take 2,000  Units by mouth daily.     cyclobenzaprine  (FLEXERIL ) 10 MG tablet Take 1 tablet (10 mg total) by mouth at bedtime. 90 tablet 3   diclofenac  (VOLTAREN ) 75 MG EC tablet Take 1 tablet (75 mg total) by mouth 2 (two) times daily. 50 tablet 2   diphenhydrAMINE  (BENADRYL ) 25 MG tablet Take 25 mg by mouth at bedtime as needed for allergies or sleep.     hydrocortisone  (ANUSOL -HC) 25 MG suppository Place 1 suppository (25 mg total) rectally 2 (two) times daily. 20 suppository 0   ibuprofen  (ADVIL ) 600 MG tablet Take 1 tablet (600 mg total) by mouth 3 (three) times daily as needed. 90 tablet 1   phentermine  (ADIPEX-P ) 37.5 MG tablet Take 1 tablet (37.5 mg total) by mouth daily before breakfast. 30 tablet 0   sulfamethoxazole -trimethoprim  (BACTRIM  DS) 800-160 MG tablet Take 1 tablet by mouth 2 (two) times daily. 10 tablet 0   tretinoin  (RETIN-A ) 0.05 % cream Apply one pea  sized amount to face every night 45 g 3   triamcinolone  (KENALOG ) 0.025 % cream Apply 1 Application topically daily as needed (Rash).     Ubrogepant  (UBRELVY ) 100 MG TABS Take 1 tablet (100 mg total) by mouth as needed. May take second dose at least 2 hours after first dose as needed 20 tablet 1   ALPRAZolam  (XANAX ) 0.5 MG tablet Take 1 tablet (0.5 mg total) by mouth daily as needed. 30 tablet 0   frovatriptan (FROVA) 2.5 MG tablet Take 2.5 mg by mouth daily as needed for migraine. (Patient not taking: Reported on 06/07/2024)     ibuprofen  (ADVIL ) 600 MG tablet Take 1 tablet (600 mg total) by mouth 3 (three) times daily as needed. 90 tablet 1   lubiprostone  (AMITIZA ) 24 MCG capsule Take 1 capsule (24 mcg total) by mouth 2 (two) times daily with a meal. (Patient not taking: Reported on 06/07/2024) 180 capsule 3   No current facility-administered medications for this visit.    Review of Systems: GENERAL: negative for malaise, night sweats HEENT: No changes in hearing or vision, no nose bleeds or other nasal problems. NECK: Negative for lumps, goiter, pain and significant neck swelling RESPIRATORY: Negative for cough, wheezing CARDIOVASCULAR: Negative for chest pain, leg swelling, palpitations, orthopnea GI: SEE HPI MUSCULOSKELETAL: Negative for joint pain or swelling, back pain, and muscle pain. SKIN: Negative for lesions, rash PSYCH: Negative for sleep disturbance, mood disorder and recent psychosocial stressors. HEMATOLOGY Negative for prolonged bleeding, bruising easily, and swollen nodes. ENDOCRINE: Negative for cold or heat intolerance, polyuria, polydipsia and goiter. NEURO: negative for tremor, gait imbalance, syncope and seizures. The remainder of the review of systems is noncontributory.   Physical Exam: BP 118/77 (BP Location: Left Arm, Patient Position: Sitting, Cuff Size: Normal)   Pulse 88   Temp 97.8 F (36.6 C) (Temporal)   Ht 5' 3.5 (1.613 m)   Wt 165 lb 11.2 oz (75.2  kg)   BMI 28.89 kg/m  GENERAL: The patient is AO x3, in no acute distress. HEENT: Head is normocephalic and atraumatic. EOMI are intact. Mouth is well hydrated and without lesions. NECK: Supple. No masses LUNGS: Clear to auscultation. No presence of rhonchi/wheezing/rales. Adequate chest expansion HEART: RRR, normal s1 and s2. ABDOMEN: Soft, nontender, no guarding, no peritoneal signs, and nondistended. BS +. No masses. EXTREMITIES: Without any cyanosis, clubbing, rash, lesions or edema. NEUROLOGIC: AOx3, no focal motor deficit. SKIN: no jaundice, no rashes  Imaging/Labs: as above  I personally reviewed and interpreted the  available labs, imaging and endoscopic files.  Impression and Plan: TOPAZ RAGLIN is a 38 y.o. female with Pmh interstitial cystitis, migraines, perianal fissure, who presents for follow up of dysphagia, constipation and rectal bleeding.  Patient has presented persistent issues with constipation and incomplete defecation despite using Amitiza  in the past.  She reports that the symptoms started after her last pregnancy.  I discussed with the patient that I suspect her symptoms may be related to pelvic floor dysfunction, but other etiologies will need to be rule out first.  Due to this we will proceed with colonoscopy and will consider eventual anorectal manometry based on findings.  For now, she was advised to continue eating  fruits with fiber and she will start Trulance  daily.  Notably, her anal fissure appears to be improving with the use of diltiazem, which she is to continue for now.  She is also presenting family issues with dysphagia and heartburn, for which we will proceed with an EGD at the same time of colonoscopy.  -Schedule EGD and colonoscopy -Start Trulance  3 mg daily -Eat prunes and/or 2 kiwis daily -Continue with diltiazem cream for rectal area  All questions were answered.      Ashley Fortune, MD Gastroenterology and Hepatology [facility]

## 2024-06-08 ENCOUNTER — Other Ambulatory Visit (HOSPITAL_COMMUNITY): Payer: Self-pay

## 2024-06-08 ENCOUNTER — Other Ambulatory Visit: Payer: Self-pay

## 2024-06-08 MED ORDER — PHENTERMINE HCL 37.5 MG PO TABS
37.5000 mg | ORAL_TABLET | Freq: Every day | ORAL | 0 refills | Status: DC
  Filled 2024-06-08 – 2024-06-10 (×3): qty 30, 30d supply, fill #0

## 2024-06-08 NOTE — Addendum Note (Signed)
 Addended by: DALLIE LIONEL RAMAN on: 06/08/2024 10:04 AM   Modules accepted: Orders

## 2024-06-10 ENCOUNTER — Other Ambulatory Visit (HOSPITAL_COMMUNITY): Payer: Self-pay

## 2024-06-10 ENCOUNTER — Other Ambulatory Visit: Payer: Self-pay

## 2024-06-11 ENCOUNTER — Other Ambulatory Visit (HOSPITAL_COMMUNITY): Payer: Self-pay

## 2024-06-12 ENCOUNTER — Other Ambulatory Visit (HOSPITAL_COMMUNITY): Payer: Self-pay

## 2024-06-14 NOTE — Telephone Encounter (Signed)
 Patient called stating that she needed to cancel her procedure due to her insurance not covering it. States she will call back when she can reschedule. Sending message to endo

## 2024-06-20 ENCOUNTER — Other Ambulatory Visit: Payer: Self-pay

## 2024-06-21 NOTE — Telephone Encounter (Signed)
 Spoke with patient, scheduled TCS/EGD for 06/29/2024. Rx sent. Instructions sent to mychart.

## 2024-06-22 ENCOUNTER — Ambulatory Visit (HOSPITAL_COMMUNITY): Admit: 2024-06-22 | Admitting: Gastroenterology

## 2024-06-22 ENCOUNTER — Encounter (HOSPITAL_COMMUNITY): Payer: Self-pay

## 2024-06-22 SURGERY — COLONOSCOPY
Anesthesia: Choice

## 2024-06-25 ENCOUNTER — Encounter (HOSPITAL_COMMUNITY)
Admission: RE | Admit: 2024-06-25 | Discharge: 2024-06-25 | Disposition: A | Discharge status: Home / Self Care | Source: Ambulatory Visit | Attending: Gastroenterology | Admitting: Gastroenterology

## 2024-06-25 ENCOUNTER — Encounter (HOSPITAL_COMMUNITY): Payer: Self-pay

## 2024-06-25 ENCOUNTER — Other Ambulatory Visit: Payer: Self-pay

## 2024-06-25 DIAGNOSIS — Z01818 Encounter for other preprocedural examination: Secondary | ICD-10-CM

## 2024-06-27 ENCOUNTER — Encounter (INDEPENDENT_AMBULATORY_CARE_PROVIDER_SITE_OTHER): Payer: Self-pay | Admitting: Gastroenterology

## 2024-06-29 ENCOUNTER — Other Ambulatory Visit: Payer: Self-pay

## 2024-06-29 ENCOUNTER — Ambulatory Visit (HOSPITAL_COMMUNITY): Payer: Self-pay

## 2024-06-29 ENCOUNTER — Other Ambulatory Visit (HOSPITAL_COMMUNITY): Payer: Self-pay

## 2024-06-29 ENCOUNTER — Encounter (HOSPITAL_COMMUNITY)
Admission: RE | Disposition: A | Discharge status: Home / Self Care | Payer: Self-pay | Source: Home / Self Care | Attending: Gastroenterology

## 2024-06-29 ENCOUNTER — Encounter (HOSPITAL_COMMUNITY): Payer: Self-pay | Admitting: Gastroenterology

## 2024-06-29 ENCOUNTER — Ambulatory Visit (HOSPITAL_COMMUNITY)
Admission: RE | Admit: 2024-06-29 | Discharge: 2024-06-29 | Disposition: A | Discharge status: Home / Self Care | Attending: Gastroenterology | Admitting: Gastroenterology

## 2024-06-29 DIAGNOSIS — Z79899 Other long term (current) drug therapy: Secondary | ICD-10-CM | POA: Diagnosis not present

## 2024-06-29 DIAGNOSIS — Z87891 Personal history of nicotine dependence: Secondary | ICD-10-CM | POA: Diagnosis not present

## 2024-06-29 DIAGNOSIS — F419 Anxiety disorder, unspecified: Secondary | ICD-10-CM | POA: Insufficient documentation

## 2024-06-29 DIAGNOSIS — R1310 Dysphagia, unspecified: Secondary | ICD-10-CM | POA: Insufficient documentation

## 2024-06-29 DIAGNOSIS — K644 Residual hemorrhoidal skin tags: Secondary | ICD-10-CM | POA: Diagnosis not present

## 2024-06-29 DIAGNOSIS — R011 Cardiac murmur, unspecified: Secondary | ICD-10-CM | POA: Diagnosis not present

## 2024-06-29 DIAGNOSIS — K648 Other hemorrhoids: Secondary | ICD-10-CM | POA: Insufficient documentation

## 2024-06-29 DIAGNOSIS — K649 Unspecified hemorrhoids: Secondary | ICD-10-CM

## 2024-06-29 DIAGNOSIS — N3010 Interstitial cystitis (chronic) without hematuria: Secondary | ICD-10-CM | POA: Insufficient documentation

## 2024-06-29 DIAGNOSIS — G43909 Migraine, unspecified, not intractable, without status migrainosus: Secondary | ICD-10-CM | POA: Insufficient documentation

## 2024-06-29 DIAGNOSIS — K5900 Constipation, unspecified: Secondary | ICD-10-CM | POA: Diagnosis not present

## 2024-06-29 DIAGNOSIS — K602 Anal fissure, unspecified: Secondary | ICD-10-CM | POA: Diagnosis not present

## 2024-06-29 DIAGNOSIS — K625 Hemorrhage of anus and rectum: Secondary | ICD-10-CM | POA: Insufficient documentation

## 2024-06-29 DIAGNOSIS — N289 Disorder of kidney and ureter, unspecified: Secondary | ICD-10-CM | POA: Diagnosis not present

## 2024-06-29 HISTORY — PX: COLONOSCOPY: SHX5424

## 2024-06-29 HISTORY — PX: ESOPHAGOGASTRODUODENOSCOPY: SHX5428

## 2024-06-29 LAB — POCT PREGNANCY, URINE: Preg Test, Ur: NEGATIVE

## 2024-06-29 SURGERY — COLONOSCOPY
Anesthesia: General

## 2024-06-29 MED ORDER — PHENYLEPHRINE 80 MCG/ML (10ML) SYRINGE FOR IV PUSH (FOR BLOOD PRESSURE SUPPORT)
PREFILLED_SYRINGE | INTRAVENOUS | Status: DC | PRN
  Administered 2024-06-29: 160 ug via INTRAVENOUS
  Administered 2024-06-29: 80 ug via INTRAVENOUS

## 2024-06-29 MED ORDER — LACTATED RINGERS IV SOLN: INTRAVENOUS | Status: DC | PRN

## 2024-06-29 MED ORDER — LACTATED RINGERS IV SOLN: INTRAVENOUS | Status: DC

## 2024-06-29 MED ORDER — HYDROCORTISONE (PERIANAL) 2.5 % EX CREA
1.0000 | TOPICAL_CREAM | Freq: Two times a day (BID) | CUTANEOUS | 1 refills | Status: AC
  Filled 2024-06-29: qty 30, 10d supply, fill #0

## 2024-06-29 MED ORDER — PROPOFOL 500 MG/50ML IV EMUL
INTRAVENOUS | Status: DC | PRN
  Administered 2024-06-29: 100 ug via INTRAVENOUS
  Administered 2024-06-29: 200 ug/kg/min via INTRAVENOUS
  Administered 2024-06-29 (×3): 50 ug via INTRAVENOUS

## 2024-06-29 MED ORDER — LIDOCAINE 2% (20 MG/ML) 5 ML SYRINGE
INTRAMUSCULAR | Status: DC | PRN
  Administered 2024-06-29: 80 mg via INTRAVENOUS

## 2024-06-29 NOTE — Op Note (Addendum)
 [facility] Patient Name: [name] Procedure Date: 06/29/2024 9:55 AM MRN: [id number] Date of Birth: 11/20/1985 Attending MD: Toribio Fortune , , [id number] CSN: [id number] Age: 38 Admit Type: Outpatient Procedure:                Colonoscopy Indications:              Rectal bleeding, Constipation Providers:                Toribio Fortune, Rosina Sprague, Madelin Hunter, RN Referring MD:             Toribio Fortune Medicines:                Monitored Anesthesia Care Complications:            No immediate complications. Estimated Blood Loss:     Estimated blood loss: none. Procedure:                Pre-Anesthesia Assessment:                           - Prior to the procedure, a History and Physical                            was performed, and patient medications, allergies                            and sensitivities were reviewed. The patient's                            tolerance of previous anesthesia was reviewed.                           - The risks and benefits of the procedure and the                            sedation options and risks were discussed with the                            patient. All questions were answered and informed                            consent was obtained.                           - ASA Grade Assessment: I - A normal, healthy                            patient.                           After obtaining informed consent, the colonoscope                            was passed under direct vision. Throughout the                            procedure, the patient's blood pressure, pulse, and  oxygen saturations were monitored continuously. The                            [id number]-[id number] ([id number]) Peds Colon was introduced                            through the anus and advanced to the the terminal                            ileum. The colonoscopy was performed without                            difficulty. The patient  tolerated the procedure                            well. The quality of the bowel preparation was                            excellent. Scope In: 10:26:46 AM Scope Out: 10:42:17 AM Scope Withdrawal Time: 0 hours 9 minutes 39 seconds  Total Procedure Duration: 0 hours 15 minutes 31 seconds  Findings:      Hemorrhoids were found on perianal exam.      The terminal ileum appeared normal.      The entire examined colon appeared normal.      Non-bleeding external and internal hemorrhoids were found during       retroflexion and during perianal exam. The hemorrhoids were small. Impression:               - Hemorrhoids found on perianal exam.                           - The examined portion of the ileum was normal.                           - The entire examined colon is normal.                           - Non-bleeding external and internal hemorrhoids.                           - No specimens collected. Moderate Sedation:      Per Anesthesia Care Recommendation:           - Discharge patient to home (ambulatory).                           - Resume previous diet.                           - Repeat colonoscopy in 10 years for screening                            purposes.                           - Referral for anorectal manometry.                           -  Restart Trulance  daily. If not able to tolerate,                            will prescribe Ibsrela. Procedure Code(s):        --- Professional ---                           [phone], Colonoscopy, flexible; diagnostic, including                            collection of specimen(s) by brushing or washing,                            when performed (separate procedure) Diagnosis Code(s):        --- Professional ---                           K64.8, Other hemorrhoids                           K62.5, Hemorrhage of anus and rectum                           K59.00, Constipation, unspecified CPT copyright 2022 American [employer]. All  rights reserved. The codes documented in this report are preliminary and upon coder review may  be revised to meet current compliance requirements. Toribio Fortune, MD Toribio Fortune,  06/29/2024 10:57:01 AM This report has been signed electronically. Number of Addenda: 0

## 2024-06-29 NOTE — Discharge Instructions (Addendum)
 You are being discharged to home.  Resume your previous diet.  We are waiting for your pathology results.  Your physician has recommended a repeat colonoscopy in 10 years for screening purposes.  Referral for anorectal manometry. Restart Trulance  daily. If not able to tolerate, will prescribe Ibsrela.

## 2024-06-29 NOTE — Op Note (Signed)
 [facility] Patient Name: [name] Procedure Date: 06/29/2024 9:56 AM MRN: [id number] Date of Birth: [birth year] Attending MD: Toribio Fortune , , [id number] CSN: [id number] Age: 38 Admit Type: Outpatient Procedure:                Upper GI endoscopy Indications:              Dysphagia Providers:                Toribio Fortune, Rosina Sprague, Madelin Hunter, RN Referring MD:             Toribio Fortune Medicines:                Monitored Anesthesia Care Complications:            No immediate complications. Estimated Blood Loss:     Estimated blood loss: none. Procedure:                Pre-Anesthesia Assessment:                           - Prior to the procedure, a History and Physical                            was performed, and patient medications, allergies                            and sensitivities were reviewed. The patient's                            tolerance of previous anesthesia was reviewed.                           - The risks and benefits of the procedure and the                            sedation options and risks were discussed with the                            patient. All questions were answered and informed                            consent was obtained.                           - ASA Grade Assessment: I - A normal, healthy                            patient.                           After obtaining informed consent, the endoscope was                            passed under direct vision. Throughout the                            procedure, the patient's blood pressure, pulse, and  oxygen saturations were monitored continuously. The                            Endoscope was introduced through the mouth, and                            advanced to the second part of duodenum. The upper                            GI endoscopy was accomplished without difficulty.                            The patient tolerated the procedure  well. Scope In: 10:13:32 AM Scope Out: 10:21:13 AM Total Procedure Duration: 0 hours 7 minutes 41 seconds  Findings:      No endoscopic abnormality was evident in the esophagus to explain the       patient's complaint of dysphagia. It was decided, however, to proceed       with dilation of the entire esophagus. A guidewire was placed and the       scope was withdrawn. Dilation was performed with a Savary dilator with       no resistance at 18 mm. The dilation site was examined following       endoscope reinsertion and showed no change. Biopsies were taken with a       cold forceps for histology.      The stomach was normal.      The examined duodenum was normal. Impression:               - No endoscopic esophageal abnormality to explain                            patient's dysphagia. Esophagus dilated. Dilated.                            Biopsied.                           - Normal stomach.                           - Normal examined duodenum. Moderate Sedation:      Per Anesthesia Care Recommendation:           - Discharge patient to home (ambulatory).                           - Resume previous diet.                           - Await pathology results.                           - If persistent dysphagia, may attempt course of                            PPI and esophagram. Procedure Code(s):        --- Professional ---  [phone], Esophagogastroduodenoscopy, flexible,                            transoral; with insertion of guide wire followed by                            passage of dilator(s) through esophagus over guide                            wire                           43239, 59, Esophagogastroduodenoscopy, flexible,                            transoral; with biopsy, single or multiple Diagnosis Code(s):        --- Professional ---                           R13.10, Dysphagia, unspecified CPT copyright 2022 American Medical Association. All rights  reserved. The codes documented in this report are preliminary and upon coder review may  be revised to meet current compliance requirements. Toribio Fortune, MD Toribio Fortune,  06/29/2024 10:53:55 AM This report has been signed electronically. Number of Addenda: 0

## 2024-06-29 NOTE — Anesthesia Preprocedure Evaluation (Signed)
 Anesthesia Evaluation  Patient identified by MRN, date of birth, ID band Patient awake    Reviewed: Allergy & Precautions, H&P , NPO status , Patient's Chart, lab work & pertinent test results, reviewed documented beta blocker date and time   History of Anesthesia Complications (+) PONV and history of anesthetic complications  Airway Mallampati: II  TM Distance: >3 FB Neck ROM: full    Dental no notable dental hx.    Pulmonary neg pulmonary ROS, asthma , Patient abstained from smoking., former smoker   Pulmonary exam normal breath sounds clear to auscultation       Cardiovascular Exercise Tolerance: Good hypertension, negative cardio ROS + Valvular Problems/Murmurs  Rhythm:regular Rate:Normal     Neuro/Psych  Headaches PSYCHIATRIC DISORDERS Anxiety      Neuromuscular disease negative neurological ROS  negative psych ROS   GI/Hepatic negative GI ROS, Neg liver ROS,GERD  ,,  Endo/Other  negative endocrine ROS    Renal/GU Renal diseasenegative Renal ROS  negative genitourinary   Musculoskeletal   Abdominal   Peds  Hematology negative hematology ROS (+) Blood dyscrasia, anemia   Anesthesia Other Findings   Reproductive/Obstetrics negative OB ROS                              Anesthesia Physical Anesthesia Plan  ASA: 2  Anesthesia Plan: General   Post-op Pain Management:    Induction:   PONV Risk Score and Plan: Propofol  infusion  Airway Management Planned:   Additional Equipment:   Intra-op Plan:   Post-operative Plan:   Informed Consent: I have reviewed the patients History and Physical, chart, labs and discussed the procedure including the risks, benefits and alternatives for the proposed anesthesia with the patient or authorized representative who has indicated his/her understanding and acceptance.     Dental Advisory Given  Plan Discussed with: CRNA  Anesthesia Plan  Comments:         Anesthesia Quick Evaluation

## 2024-06-29 NOTE — Interval H&P Note (Signed)
 History and Physical Interval Note:  06/29/2024 8:20 AM  [name]  has presented today for surgery, with the diagnosis of dysphagia, constipation, rectal bleeding, and perianal fissure.  The various methods of treatment have been discussed with the patient and family. After consideration of risks, benefits and other options for treatment, the patient has consented to  Procedure(s) with comments: COLONOSCOPY (N/A) - 10:15am, ASA 1-2 EGD (ESOPHAGOGASTRODUODENOSCOPY) (N/A) as a surgical intervention.  The patient's history has been reviewed, patient examined, no change in status, stable for surgery.  I have reviewed the patient's chart and labs.  Questions were answered to the patient's satisfaction.     [name]

## 2024-07-02 ENCOUNTER — Ambulatory Visit (INDEPENDENT_AMBULATORY_CARE_PROVIDER_SITE_OTHER): Payer: Self-pay | Admitting: Gastroenterology

## 2024-07-02 ENCOUNTER — Telehealth (INDEPENDENT_AMBULATORY_CARE_PROVIDER_SITE_OTHER): Payer: Self-pay | Admitting: *Deleted

## 2024-07-02 ENCOUNTER — Other Ambulatory Visit: Payer: Self-pay

## 2024-07-02 LAB — SURGICAL PATHOLOGY

## 2024-07-02 NOTE — Transfer of Care (Signed)
 Immediate Anesthesia Transfer of Care Note  Patient: [name]  Procedure(s) Performed: COLONOSCOPY EGD (ESOPHAGOGASTRODUODENOSCOPY)  Patient Location: Short Stay  Anesthesia Type:General  Level of Consciousness: awake  Airway & Oxygen Therapy: Patient Spontanous Breathing  Post-op Assessment: Report given to RN and Post -op Vital signs reviewed and stable  Post vital signs: Reviewed and unstable  Last Vitals:  Vitals Value Taken Time  BP 106/75 06/29/24 10:54  Temp 37.1 C 06/29/24 10:48  Pulse 98 06/29/24 10:48  Resp 22 06/29/24 10:48  SpO2 100 % 06/29/24 10:48    Last Pain:  Vitals:   06/29/24 1048  TempSrc: Oral  PainSc: 0-No pain         Complications: No notable events documented.  Late Entry for Elvie Hacker, CRNA

## 2024-07-02 NOTE — Telephone Encounter (Signed)
-----   Message from Toribio Fortune Mayorga sent at 06/29/2024 11:03 AM EDT ----- Erskin Caldron, can you please refer the patient to anorectal manometry at [facility]. Dx constipation  Thanks,   Toribio Fortune, MD Gastroenterology and Hepatology [facility]

## 2024-07-02 NOTE — Telephone Encounter (Signed)
 Referral sent, they will contact patient with apt

## 2024-07-02 NOTE — Anesthesia Postprocedure Evaluation (Signed)
 Anesthesia Post Note  Patient: [name]  Procedure(s) Performed: COLONOSCOPY EGD (ESOPHAGOGASTRODUODENOSCOPY)  Patient location during evaluation: Phase II Anesthesia Type: General Level of consciousness: awake Pain management: pain level controlled Vital Signs Assessment: post-procedure vital signs reviewed and stable Respiratory status: spontaneous breathing and respiratory function stable Cardiovascular status: blood pressure returned to baseline and stable Postop Assessment: no headache and no apparent nausea or vomiting Anesthetic complications: no Comments: Late entry   No notable events documented.   Last Vitals:  Vitals:   06/29/24 1048 06/29/24 1054  BP: (!) 98/48 106/75  Pulse: 98   Resp: (!) 22   Temp: 37.1 C   SpO2: 100%     Last Pain:  Vitals:   06/29/24 1048  TempSrc: Oral  PainSc: 0-No pain                 Yvonna JINNY Bosworth

## 2024-07-03 ENCOUNTER — Encounter (HOSPITAL_COMMUNITY): Payer: Self-pay | Admitting: Gastroenterology

## 2024-07-04 NOTE — Progress Notes (Signed)
 Patient result letter mailed procedure note and pathology result faxed to PCP

## 2024-07-08 ENCOUNTER — Telehealth: Admitting: Nurse Practitioner

## 2024-07-08 DIAGNOSIS — R309 Painful micturition, unspecified: Secondary | ICD-10-CM

## 2024-07-08 DIAGNOSIS — R3989 Other symptoms and signs involving the genitourinary system: Secondary | ICD-10-CM | POA: Diagnosis not present

## 2024-07-08 MED ORDER — SULFAMETHOXAZOLE-TRIMETHOPRIM 800-160 MG PO TABS: 1.0000 | ORAL_TABLET | Freq: Two times a day (BID) | ORAL | 0 refills | Status: AC

## 2024-07-08 NOTE — Progress Notes (Signed)

## 2024-07-10 ENCOUNTER — Other Ambulatory Visit: Payer: Self-pay

## 2024-07-10 MED ORDER — PHENTERMINE HCL 37.5 MG PO TABS
37.5000 mg | ORAL_TABLET | Freq: Every day | ORAL | 0 refills | Status: DC
  Filled 2024-07-10: qty 30, 30d supply, fill #0

## 2024-08-15 ENCOUNTER — Other Ambulatory Visit: Payer: Self-pay

## 2024-08-17 ENCOUNTER — Other Ambulatory Visit: Payer: Self-pay

## 2024-08-17 ENCOUNTER — Other Ambulatory Visit (HOSPITAL_COMMUNITY): Payer: Self-pay

## 2024-08-17 MED ORDER — ALPRAZOLAM 0.5 MG PO TABS
0.5000 mg | ORAL_TABLET | Freq: Every day | ORAL | 0 refills | Status: AC | PRN
  Filled 2024-08-17: qty 30, 30d supply, fill #0

## 2024-08-17 MED ORDER — UBRELVY 100 MG PO TABS
ORAL_TABLET | ORAL | 1 refills | Status: AC
  Filled 2024-08-17: qty 20, 30d supply, fill #0
  Filled 2024-09-25: qty 10, 30d supply, fill #0

## 2024-08-23 ENCOUNTER — Other Ambulatory Visit (HOSPITAL_COMMUNITY): Payer: Self-pay

## 2024-09-24 ENCOUNTER — Other Ambulatory Visit (HOSPITAL_COMMUNITY): Payer: Self-pay

## 2024-09-24 MED ORDER — NYSTATIN-TRIAMCINOLONE 100000-0.1 UNIT/GM-% EX CREA
TOPICAL_CREAM | CUTANEOUS | 1 refills | Status: AC
  Filled 2024-09-24: qty 30, 30d supply, fill #0

## 2024-09-25 ENCOUNTER — Other Ambulatory Visit: Payer: Self-pay

## 2024-09-25 ENCOUNTER — Other Ambulatory Visit (HOSPITAL_COMMUNITY): Payer: Self-pay

## 2024-10-04 ENCOUNTER — Other Ambulatory Visit (HOSPITAL_COMMUNITY): Payer: Self-pay

## 2024-10-04 MED ORDER — PHENTERMINE HCL 37.5 MG PO TABS
37.5000 mg | ORAL_TABLET | Freq: Every morning | ORAL | 0 refills | Status: AC
  Filled 2024-10-04: qty 30, 30d supply, fill #0
